# Patient Record
Sex: Female | Born: 1945 | Race: White | Hispanic: No | Marital: Married | Smoking: Never smoker
Health system: Southern US, Community
[De-identification: ages and names within clinical notes are randomized; demographics above are authoritative.]

## PROBLEM LIST (undated history)

## (undated) DIAGNOSIS — I509 Heart failure, unspecified: Secondary | ICD-10-CM

## (undated) DIAGNOSIS — M1990 Unspecified osteoarthritis, unspecified site: Secondary | ICD-10-CM

## (undated) DIAGNOSIS — H269 Unspecified cataract: Secondary | ICD-10-CM

## (undated) DIAGNOSIS — I499 Cardiac arrhythmia, unspecified: Secondary | ICD-10-CM

## (undated) DIAGNOSIS — K219 Gastro-esophageal reflux disease without esophagitis: Secondary | ICD-10-CM

## (undated) DIAGNOSIS — M47816 Spondylosis without myelopathy or radiculopathy, lumbar region: Secondary | ICD-10-CM

## (undated) DIAGNOSIS — I4891 Unspecified atrial fibrillation: Secondary | ICD-10-CM

## (undated) HISTORY — DX: Gastro-esophageal reflux disease without esophagitis: K21.9

## (undated) HISTORY — DX: Hemochromatosis, unspecified: E83.119

## (undated) HISTORY — DX: Unspecified cataract: H26.9

## (undated) HISTORY — DX: Heart failure, unspecified: I50.9

## (undated) HISTORY — DX: Unspecified osteoarthritis, unspecified site: M19.90

## (undated) HISTORY — DX: Other disorders of iron metabolism: E83.19

## (undated) HISTORY — PX: TONSILLECTOMY: SUR1361

## (undated) HISTORY — DX: Unspecified atrial fibrillation: I48.91

---

## 2017-02-27 ENCOUNTER — Other Ambulatory Visit: Payer: Self-pay

## 2017-02-27 ENCOUNTER — Ambulatory Visit (INDEPENDENT_AMBULATORY_CARE_PROVIDER_SITE_OTHER): Payer: Medicare Other | Admitting: Family Medicine

## 2017-02-27 ENCOUNTER — Encounter: Payer: Self-pay | Admitting: Family Medicine

## 2017-02-27 VITALS — BP 120/66 | HR 92 | Temp 98.4°F | Resp 16 | Ht 67.0 in | Wt 241.0 lb

## 2017-02-27 DIAGNOSIS — I4891 Unspecified atrial fibrillation: Secondary | ICD-10-CM | POA: Diagnosis not present

## 2017-02-27 DIAGNOSIS — L989 Disorder of the skin and subcutaneous tissue, unspecified: Secondary | ICD-10-CM | POA: Diagnosis not present

## 2017-02-27 DIAGNOSIS — Z23 Encounter for immunization: Secondary | ICD-10-CM | POA: Diagnosis not present

## 2017-02-27 DIAGNOSIS — R5383 Other fatigue: Secondary | ICD-10-CM | POA: Diagnosis not present

## 2017-02-27 DIAGNOSIS — E785 Hyperlipidemia, unspecified: Secondary | ICD-10-CM | POA: Insufficient documentation

## 2017-02-27 DIAGNOSIS — Z79899 Other long term (current) drug therapy: Secondary | ICD-10-CM | POA: Diagnosis not present

## 2017-02-27 DIAGNOSIS — E782 Mixed hyperlipidemia: Secondary | ICD-10-CM | POA: Diagnosis not present

## 2017-02-27 DIAGNOSIS — E559 Vitamin D deficiency, unspecified: Secondary | ICD-10-CM

## 2017-02-27 DIAGNOSIS — I482 Chronic atrial fibrillation, unspecified: Secondary | ICD-10-CM | POA: Insufficient documentation

## 2017-02-27 NOTE — Progress Notes (Signed)
Patient ID: Samona Chihuahua, female    DOB: [birth year], 71 y.o.   MRN: [id number]  Chief Complaint  Patient presents with  . Atrial Fibrillation  . Rash    Allergies Tetanus toxoids  Subjective:   Selena Swaminathan is a 71 y.o. female who presents to [facility] today.  HPI Here to establish care.  Reports that she recently moved with her husband from [place], [place].  Reports that she moved to this area to be closer to her children which live in [place].  She reports that she has not really been seen by a physician in approximately a year.  Was previously followed by cardiology for history of atrial fibrillation which was rate controlled on medication.  She reports that she has continued to take her medications each day.  She denies any chest pain, shortness of breath, palpitations, or dyspnea on exertion.  She does not do much exercise but reports that she stays active around the house.  Has been on flecainide and metoprolol with no problems.  History of hemochromatosis.  Reports that she was initially seen by gastroenterologist and would go in for routine lab draws to decrease her ferritin and hemoglobin levels.  Reports that later on she would just go give blood to the [facility].  However she reports that [place] does not allow blood donation by patients with hemochromatosis because it is considered a disease in [place].  She has not had any lab checks or blood donations in over 6 months.  She reports that she has had an area on the surface of her left hand which is increased in size over the past several months.  She reports the area feels hard and is tender to palpation.  She is concerned that it could be skin cancer and would like this checked out.  She reports that she has never been to a dermatologist in the past.  No prior history of skin cancer.  She reports she is concerned because her husband does have a history of melanoma.    Past Medical History:  Diagnosis  Date  . Atrial fibrillation ([place])   . Iron excess     Past Surgical History:  Procedure Laterality Date  . CESAREAN SECTION    . TONSILLECTOMY      Family History  Problem Relation Age of Onset  . Stroke Mother   . Stroke Maternal Grandmother   . Diabetes Paternal Grandfather   . Arthritis Brother      Social History   Socioeconomic History  . Marital status: Married    Spouse name: None  . Number of children: None  . Years of education: None  . Highest education level: None  Social Needs  . Financial resource strain: None  . Food insecurity - worry: None  . Food insecurity - inability: None  . Transportation needs - medical: None  . Transportation needs - non-medical: None  Occupational History  . None  Tobacco Use  . Smoking status: Never Smoker  . Smokeless tobacco: Never Used  Substance and Sexual Activity  . Alcohol use: Yes    Alcohol/week: 1.2 oz    Types: 2 Glasses of wine per week  . Drug use: No  . Sexual activity: Yes  Other Topics Concern  . None  Social History Narrative   Retired Copywriter, advertising.   Married.   Has children 3.    Grew up in [place], [place].   Married for over 31 years.  Eats all food groups.   Just moved to [place], [place] in 8/18.     Review of Systems  Constitutional: Negative for activity change, appetite change and fever.  Eyes: Negative for visual disturbance.  Respiratory: Negative for cough, chest tightness and shortness of breath.   Cardiovascular: Negative for chest pain, palpitations and leg swelling.  Gastrointestinal: Negative for abdominal pain, nausea and vomiting.  Genitourinary: Negative for dysuria, frequency and urgency.  Neurological: Negative for dizziness, syncope and light-headedness.  Hematological: Negative for adenopathy.     Objective:   BP 120/66 (BP Location: Left Arm, Patient Position: Sitting, Cuff Size: Normal)   Pulse 92   Temp 98.4 F (36.9 C) (Other (Comment))   Resp 16   Ht 5'  7" (1.702 m)   Wt 241 lb (109.3 kg)   SpO2 97%   BMI 37.75 kg/m   Physical Exam  Constitutional: She is oriented to person, place, and time. She appears well-developed and well-nourished. No distress.  HENT:  Head: Normocephalic and atraumatic.  Eyes: Pupils are equal, round, and reactive to light.  Neck: Normal range of motion. Neck supple. No thyromegaly present.  Cardiovascular: Normal rate, regular rhythm and normal heart sounds.  Pulmonary/Chest: Effort normal and breath sounds normal. No respiratory distress.  Abdominal: Soft. Bowel sounds are normal.  Obese  Musculoskeletal: Normal range of motion.  Neurological: She is alert and oriented to person, place, and time. No cranial nerve deficit. Coordination normal.  Skin: Skin is warm and dry. Capillary refill takes less than 2 seconds.  1 x 0.5 cm lesion on right hand, keratinized surface, with surrounding erythematous base.  Tenderness to palpation.  Psychiatric: She has a normal mood and affect. Her behavior is normal. Thought content normal.  Nursing note and vitals reviewed.    Assessment and Plan   1. Skin lesion Discussed with patient that at this time due to the location and the tenderness of skin lesion that I would recommend she be seen by dermatology for evaluation and management.  Referral placed. - Ambulatory referral to Dermatology  2. Mixed hyperlipidemia Check cholesterol panel and screen for diabetes today.  Diet, exercise, and weight loss discussed with patient. - Hemoglobin A1c - Lipid panel  3. High risk medication use Check secondary to cardiac medications. - Basic metabolic panel  4. Vitamin D deficiency History of vitamin D deficiency.  Check levels.  Discussed by  bone mineral density at follow-up.   - Vitamin D (25 hydroxy)  5. Fatigue, unspecified type Persistent and chronic, but not interfering with daily life. - TSH  6. Hereditary hemochromatosis ([place]) Check labs and determine  management after discussing with local physicians to find out if generally managed by GI versus hematology.  Will also need to check in to where patient can get her blood draws performed. - CBC with Differential/Platelet - Fe+TIBC+Fer  7. Atrial fibrillation, unspecified type [facility]) Her medications will be refilled but she is in agreement to follow-up with cardiology.  Stable at this time.  However I did discuss with patient that she should have yearly follow-up by cardiology. - Hemoglobin A1c - Ambulatory referral to Cardiology  8. Need for immunization against influenza We will  request immunization records from previous physicians. - Flu Vaccine QUAD 36+ mos IM Return in about 3 months (around 05/28/2017). Caren Macadam, MD 02/27/2017

## 2017-02-28 LAB — LIPID PANEL
CHOLESTEROL: 201 mg/dL — AB (ref <200)
HDL: 50 mg/dL — AB (ref >50)
LDL CHOLESTEROL (CALC): 120 mg/dL — AB
Non-HDL Cholesterol (Calc): 151 mg/dL (calc) — ABNORMAL HIGH (ref <130)
TRIGLYCERIDES: 185 mg/dL — AB (ref <150)
Total CHOL/HDL Ratio: 4 (calc) (ref <5.0)

## 2017-02-28 LAB — BASIC METABOLIC PANEL
BUN: 24 mg/dL (ref 7–25)
CALCIUM: 8.8 mg/dL (ref 8.6–10.4)
CHLORIDE: 106 mmol/L (ref 98–110)
CO2: 29 mmol/L (ref 20–32)
CREATININE: 0.83 mg/dL (ref 0.60–0.93)
Glucose, Bld: 129 mg/dL — ABNORMAL HIGH (ref 65–99)
Potassium: 3.8 mmol/L (ref 3.5–5.3)
Sodium: 142 mmol/L (ref 135–146)

## 2017-02-28 LAB — CBC WITH DIFFERENTIAL/PLATELET
BASOS PCT: 1 %
Basophils Absolute: 52 cells/uL (ref 0–200)
EOS PCT: 4.9 %
Eosinophils Absolute: 255 cells/uL (ref 15–500)
HCT: 40.2 % (ref 35.0–45.0)
Hemoglobin: 13.8 g/dL (ref 11.7–15.5)
Lymphs Abs: 1300 cells/uL (ref 850–3900)
MCH: 31.6 pg (ref 27.0–33.0)
MCHC: 34.3 g/dL (ref 32.0–36.0)
MCV: 92 fL (ref 80.0–100.0)
MPV: 9.9 fL (ref 7.5–12.5)
Monocytes Relative: 6.8 %
Neutro Abs: 3240 cells/uL (ref 1500–7800)
Neutrophils Relative %: 62.3 %
PLATELETS: 185 10*3/uL (ref 140–400)
RBC: 4.37 10*6/uL (ref 3.80–5.10)
RDW: 11.8 % (ref 11.0–15.0)
TOTAL LYMPHOCYTE: 25 %
WBC: 5.2 10*3/uL (ref 3.8–10.8)
WBCMIX: 354 {cells}/uL (ref 200–950)

## 2017-02-28 LAB — HEMOGLOBIN A1C
EAG (MMOL/L): 5.8 (calc)
Hgb A1c MFr Bld: 5.3 % of total Hgb (ref <5.7)
MEAN PLASMA GLUCOSE: 105 (calc)

## 2017-02-28 LAB — IRON,TIBC AND FERRITIN PANEL
%SAT: 58 % — AB (ref 11–50)
FERRITIN: 184 ng/mL (ref 20–288)
Iron: 150 ug/dL (ref 45–160)
TIBC: 258 mcg/dL (calc) (ref 250–450)

## 2017-02-28 LAB — VITAMIN D 25 HYDROXY (VIT D DEFICIENCY, FRACTURES): Vit D, 25-Hydroxy: 43 ng/mL (ref 30–100)

## 2017-02-28 LAB — TSH: TSH: 1.45 m[IU]/L (ref 0.40–4.50)

## 2017-03-03 ENCOUNTER — Encounter: Payer: Self-pay | Admitting: Family Medicine

## 2017-03-20 ENCOUNTER — Encounter: Payer: Self-pay | Admitting: Family Medicine

## 2017-04-02 ENCOUNTER — Ambulatory Visit: Payer: Medicare Other | Admitting: Cardiovascular Disease

## 2017-04-02 ENCOUNTER — Encounter: Payer: Self-pay | Admitting: Cardiovascular Disease

## 2017-04-02 VITALS — BP 134/74 | HR 82 | Ht 68.0 in | Wt 242.0 lb

## 2017-04-02 DIAGNOSIS — I480 Paroxysmal atrial fibrillation: Secondary | ICD-10-CM

## 2017-04-02 DIAGNOSIS — Z7189 Other specified counseling: Secondary | ICD-10-CM

## 2017-04-02 MED ORDER — APIXABAN 5 MG PO TABS: 5.0000 mg | ORAL_TABLET | Freq: Two times a day (BID) | ORAL | 11 refills | Status: DC

## 2017-04-02 NOTE — Progress Notes (Signed)
CARDIOLOGY CONSULT NOTE  Patient ID: Amanda Mckay MRN: [id number] DOB/AGE: Jan 05, 1946 72 y.o.  Admit date: (Not on file) Primary Physician: Caren Macadam, MD Referring Physician: Caren Macadam, MD  Reason for Consultation: Atrial fibrillation  HPI: Amanda Mckay is a 72 y.o. female who is being seen today for the evaluation of atrial fibrillation at the request of Caren Macadam, MD.   I reviewed notes from her PCP.  She recently moved with her husband from [place], [place] in August 2018.  She did so in order to be closer to her children who live in [place].  She has a history of atrial fibrillation and hemochromatosis.  She is a retired Copywriter, advertising.  I reviewed labs performed on 02/27/17.  Basic metabolic panel, hemoglobin A1c, and CBC are normal.  Lipid panel demonstrated total cholesterol 201, HDL 50, triglycerides 185, LDL 120.  TSH and vitamin D levels were also normal.  ECG performed in the office today which I ordered and personally interpreted demonstrates normal sinus rhythm with no ischemic ST segment or T-wave abnormalities, nor any arrhythmias.  She tells me she was first diagnosed with atrial fibrillation approximately 5 years ago.  She had no associated infections at that time which may have triggered it.  She was placed on Xarelto for short period of time and then it was stopped.  She has been maintained on aspirin.  She was also started on metoprolol flecainide and has been maintained on this since that time.  She denies any exertional chest pain, shortness of breath, orthopnea, leg swelling, and paroxysmal nocturnal dyspnea.  She very seldom has palpitations which may last 3 or 4 minutes at a time.  Family history: Both her father and her son have atrial fibrillation.  Her son is now 67 but was first diagnosed at age 25.  Her mother, maternal aunt, and maternal grandmother have all had strokes.    Allergies  Allergen Reactions  . Tetanus Toxoids      Current Outpatient Medications  Medication Sig Dispense Refill  . calcium carbonate (CALCIUM 600) 600 MG TABS tablet Take 600 mg by mouth 2 (two) times daily with a meal.    . Cholecalciferol (D3-1000) 1000 units capsule Take 1,000 Units by mouth daily.    . famotidine (PEPCID) 40 MG tablet Take 40 mg by mouth as needed for heartburn or indigestion.    . flecainide (TAMBOCOR) 100 MG tablet Take 150 mg by mouth 2 (two) times daily.    . metoprolol tartrate (LOPRESSOR) 25 MG tablet Take 25 mg by mouth 2 (two) times daily.    [name] apixaban (ELIQUIS) 5 MG TABS tablet Take 1 tablet (5 mg total) by mouth 2 (two) times daily. 60 tablet 11   No current facility-administered medications for this visit.     Past Medical History:  Diagnosis Date  . Atrial fibrillation ([place])   . Iron excess     Past Surgical History:  Procedure Laterality Date  . CESAREAN SECTION    . TONSILLECTOMY      Social History   Socioeconomic History  . Marital status: Married    Spouse name: Not on file  . Number of children: Not on file  . Years of education: Not on file  . Highest education level: Not on file  Social Needs  . Financial resource strain: Not on file  . Food insecurity - worry: Not on file  . Food insecurity - inability: Not on file  .  Transportation needs - medical: Not on file  . Transportation needs - non-medical: Not on file  Occupational History  . Not on file  Tobacco Use  . Smoking status: Never Smoker  . Smokeless tobacco: Never Used  Substance and Sexual Activity  . Alcohol use: Yes    Alcohol/week: 1.2 oz    Types: 2 Glasses of wine per week  . Drug use: No  . Sexual activity: Yes  Other Topics Concern  . Not on file  Social History Narrative   Retired Copywriter, advertising.   Married.   Has children 3.    Grew up in [place], [place].   Married for over 31 years.    Eats all food groups.   Just moved to [place], [place] in 8/18.      No family history of premature CAD in  1st degree relatives.  Current Meds  Medication Sig  . calcium carbonate (CALCIUM 600) 600 MG TABS tablet Take 600 mg by mouth 2 (two) times daily with a meal.  . Cholecalciferol (D3-1000) 1000 units capsule Take 1,000 Units by mouth daily.  . famotidine (PEPCID) 40 MG tablet Take 40 mg by mouth as needed for heartburn or indigestion.  . flecainide (TAMBOCOR) 100 MG tablet Take 150 mg by mouth 2 (two) times daily.  . metoprolol tartrate (LOPRESSOR) 25 MG tablet Take 25 mg by mouth 2 (two) times daily.  . [DISCONTINUED] aspirin 325 MG tablet Take 325 mg by mouth daily.      Review of systems complete and found to be negative unless listed above in HPI    Physical exam Blood pressure 134/74, pulse 82, height 5\' 8"  (1.727 m), weight 242 lb (109.8 kg), SpO2 96 %. General: NAD Neck: No JVD, no thyromegaly or thyroid nodule.  Lungs: Clear to auscultation bilaterally with normal respiratory effort. CV: Nondisplaced PMI. Regular rate and rhythm, normal S1/S2, no S3/S4, no murmur.  Trivial peri-ankle edema.  No carotid bruit.    Abdomen: Soft, nontender, no distention.  Skin: Intact without lesions or rashes.  Neurologic: Alert and oriented x 3.  Psych: Normal affect. Extremities: No clubbing or cyanosis.  HEENT: Normal.   ECG: Most recent ECG reviewed.   Labs: Lab Results  Component Value Date/Time   K 3.8 02/27/2017 10:35 AM   BUN 24 02/27/2017 10:35 AM   CREATININE 0.83 02/27/2017 10:35 AM   TSH 1.45 02/27/2017 10:35 AM   HGB 13.8 02/27/2017 10:35 AM     Lipids: Lab Results  Component Value Date/Time   CHOL 201 (H) 02/27/2017 10:35 AM   TRIG 185 (H) 02/27/2017 10:35 AM   HDL 50 (L) 02/27/2017 10:35 AM        ASSESSMENT AND PLAN:  1.  Paroxysmal atrial fibrillation: She is symptomatically stable and has been maintained on flecainide and metoprolol tartrate for several years.  She has also been maintained on aspirin.  This patients CHA2DS2-VASc Score and unadjusted  Ischemic Stroke Rate (% per year) is equal to 2.  Thus, systemic anticoagulation is indicated to reduce thromboembolic risk. I will discontinue aspirin and start Eliquis 5 mg twice daily.  For the time being I will keep her on metoprolol and flecainide but may consider the discontinuation of flecainide in the future.      Disposition: Follow up in 6 months  Signed: Kate Sable, M.D., F.A.C.C.  04/02/2017, 8:56 AM

## 2017-04-02 NOTE — Patient Instructions (Signed)
Medication Instructions:  Your physician has recommended you make the following change in your medication: Stop Taking Aspirin  Start Taking Eliquis 5 mg Two Times Daily    Labwork: NONE   Testing/Procedures: NONE   Follow-Up: Your physician wants you to follow-up in: 6 Months with Dr. Bronson Ing.  You will receive a reminder letter in the mail two months in advance. If you don't receive a letter, please call our office to schedule the follow-up appointment.   Any Other Special Instructions Will Be Listed Below (If Applicable).     If you need a refill on your cardiac medications before your next appointment, please call your pharmacy. Thank you for choosing [facility]!

## 2017-05-28 ENCOUNTER — Other Ambulatory Visit: Payer: Self-pay | Admitting: Family Medicine

## 2017-05-28 ENCOUNTER — Ambulatory Visit: Payer: Medicare Other | Admitting: Family Medicine

## 2017-05-28 ENCOUNTER — Encounter: Payer: Self-pay | Admitting: Family Medicine

## 2017-05-28 VITALS — BP 132/78 | HR 97 | Temp 98.1°F | Resp 16 | Ht 68.0 in | Wt 238.2 lb

## 2017-05-28 DIAGNOSIS — Z1231 Encounter for screening mammogram for malignant neoplasm of breast: Secondary | ICD-10-CM

## 2017-05-28 DIAGNOSIS — I480 Paroxysmal atrial fibrillation: Secondary | ICD-10-CM | POA: Diagnosis not present

## 2017-05-28 DIAGNOSIS — M1712 Unilateral primary osteoarthritis, left knee: Secondary | ICD-10-CM | POA: Diagnosis not present

## 2017-05-28 DIAGNOSIS — Z1211 Encounter for screening for malignant neoplasm of colon: Secondary | ICD-10-CM | POA: Diagnosis not present

## 2017-05-28 DIAGNOSIS — Z1239 Encounter for other screening for malignant neoplasm of breast: Secondary | ICD-10-CM

## 2017-05-28 DIAGNOSIS — Z1212 Encounter for screening for malignant neoplasm of rectum: Secondary | ICD-10-CM

## 2017-05-28 DIAGNOSIS — Z1159 Encounter for screening for other viral diseases: Secondary | ICD-10-CM | POA: Diagnosis not present

## 2017-05-28 NOTE — Patient Instructions (Signed)
Fat and Cholesterol Restricted Diet Getting too much fat and cholesterol in your diet may cause health problems. Following this diet helps keep your fat and cholesterol at normal levels. This can keep you from getting sick. What types of fat should I choose?  Choose monosaturated and polyunsaturated fats. These are found in foods such as olive oil, canola oil, flaxseeds, walnuts, almonds, and seeds.  Eat more omega-3 fats. Good choices include salmon, mackerel, sardines, tuna, flaxseed oil, and ground flaxseeds.  Limit saturated fats. These are in animal products such as meats, butter, and cream. They can also be in plant products such as palm oil, palm kernel oil, and coconut oil.  Avoid foods with partially hydrogenated oils in them. These contain trans fats. Examples of foods that have trans fats are stick margarine, some tub margarines, cookies, crackers, and other baked goods. What general guidelines do I need to follow?  Check food labels. Look for the words "trans fat" and "saturated fat."  When preparing a meal: ? Fill half of your plate with vegetables and green salads. ? Fill one fourth of your plate with whole grains. Look for the word "whole" as the first word in the ingredient list. ? Fill one fourth of your plate with lean protein foods.  Eat more foods that have fiber, like apples, carrots, beans, peas, and barley.  Eat more home-cooked foods. Eat less at restaurants and buffets.  Limit or avoid alcohol.  Limit foods high in starch and sugar.  Limit fried foods.  Cook foods without frying them. Baking, boiling, grilling, and broiling are all great options.  Lose weight if you are overweight. Losing even a small amount of weight can help your overall health. It can also help prevent diseases such as diabetes and heart disease. What foods can I eat? Grains Whole grains, such as whole wheat or whole grain breads, crackers, cereals, and pasta. Unsweetened oatmeal,  bulgur, barley, quinoa, or brown rice. Corn or whole wheat flour tortillas. Vegetables Fresh or frozen vegetables (raw, steamed, roasted, or grilled). Green salads. Fruits All fresh, canned (in natural juice), or frozen fruits. Meat and Other Protein Products Ground beef (85% or leaner), grass-fed beef, or beef trimmed of fat. Skinless chicken or turkey. Ground chicken or turkey. Pork trimmed of fat. All fish and seafood. Eggs. Dried beans, peas, or lentils. Unsalted nuts or seeds. Unsalted canned or dry beans. Dairy Low-fat dairy products, such as skim or 1% milk, 2% or reduced-fat cheeses, low-fat ricotta or cottage cheese, or plain low-fat yogurt. Fats and Oils Tub margarines without trans fats. Light or reduced-fat mayonnaise and salad dressings. Avocado. Olive, canola, sesame, or safflower oils. Natural peanut or almond butter (choose ones without added sugar and oil). The items listed above may not be a complete list of recommended foods or beverages. Contact your dietitian for more options. What foods are not recommended? Grains White bread. White pasta. White rice. Cornbread. Bagels, pastries, and croissants. Crackers that contain trans fat. Vegetables White potatoes. Corn. Creamed or fried vegetables. Vegetables in a cheese sauce. Fruits Dried fruits. Canned fruit in light or heavy syrup. Fruit juice. Meat and Other Protein Products Fatty cuts of meat. Ribs, chicken wings, bacon, sausage, bologna, salami, chitterlings, fatback, hot dogs, bratwurst, and packaged luncheon meats. Liver and organ meats. Dairy Whole or 2% milk, cream, half-and-half, and cream cheese. Whole milk cheeses. Whole-fat or sweetened yogurt. Full-fat cheeses. Nondairy creamers and whipped toppings. Processed cheese, cheese spreads, or cheese curds. Sweets and Desserts Corn   syrup, sugars, honey, and molasses. Candy. Jam and jelly. Syrup. Sweetened cereals. Cookies, pies, cakes, donuts, muffins, and ice  cream. Fats and Oils Butter, stick margarine, lard, shortening, ghee, or bacon fat. Coconut, palm kernel, or palm oils. Beverages Alcohol. Sweetened drinks (such as sodas, lemonade, and fruit drinks or punches). The items listed above may not be a complete list of foods and beverages to avoid. Contact your dietitian for more information. This information is not intended to replace advice given to you by your health care provider. Make sure you discuss any questions you have with your health care provider. Document Released: 09/03/2011 Document Revised: 11/09/2015 Document Reviewed: 06/03/2013 Elsevier Interactive Patient Education  2018 [place]. High Cholesterol High cholesterol is a condition in which the blood has high levels of a white, waxy, fat-like substance (cholesterol). The human body needs small amounts of cholesterol. The liver makes all the cholesterol that the body needs. Extra (excess) cholesterol comes from the food that we eat. Cholesterol is carried from the liver by the blood through the blood vessels. If you have high cholesterol, deposits (plaques) may build up on the walls of your blood vessels (arteries). Plaques make the arteries narrower and stiffer. Cholesterol plaques increase your risk for heart attack and stroke. Work with your health care provider to keep your cholesterol levels in a healthy range. What increases the risk? This condition is more likely to develop in people who:  Eat foods that are high in animal fat (saturated fat) or cholesterol.  Are overweight.  Are not getting enough exercise.  Have a family history of high cholesterol.  What are the signs or symptoms? There are no symptoms of this condition. How is this diagnosed? This condition may be diagnosed from the results of a blood test.  If you are older than age 61, your health care provider may check your cholesterol every 4-6 years.  You may be checked more often if you already have  high cholesterol or other risk factors for heart disease.  The blood test for cholesterol measures:  "Bad" cholesterol (LDL cholesterol). This is the main type of cholesterol that causes heart disease. The desired level for LDL is less than 100.  "Good" cholesterol (HDL cholesterol). This type helps to protect against heart disease by cleaning the arteries and carrying the LDL away. The desired level for HDL is 60 or higher.  Triglycerides. These are fats that the body can store or burn for energy. The desired number for triglycerides is lower than 150.  Total cholesterol. This is a measure of the total amount of cholesterol in your blood, including LDL cholesterol, HDL cholesterol, and triglycerides. A healthy number is less than 200.  How is this treated? This condition is treated with diet changes, lifestyle changes, and medicines. Diet changes  This may include eating more whole grains, fruits, vegetables, nuts, and fish.  This may also include cutting back on red meat and foods that have a lot of added sugar. Lifestyle changes  Changes may include getting at least 40 minutes of aerobic exercise 3 times a week. Aerobic exercises include walking, biking, and swimming. Aerobic exercise along with a healthy diet can help you maintain a healthy weight.  Changes may also include quitting smoking. Medicines  Medicines are usually given if diet and lifestyle changes have failed to reduce your cholesterol to healthy levels.  Your health care provider may prescribe a statin medicine. Statin medicines have been shown to reduce cholesterol, which can reduce the  risk of heart disease. Follow these instructions at home: Eating and drinking  If told by your health care provider:  Eat chicken (without skin), fish, veal, shellfish, ground [place] breast, and round or loin cuts of red meat.  Do not eat fried foods or fatty meats, such as hot dogs and salami.  Eat plenty of fruits, such as  apples.  Eat plenty of vegetables, such as broccoli, potatoes, and carrots.  Eat beans, peas, and lentils.  Eat grains such as barley, rice, couscous, and bulgur wheat.  Eat pasta without cream sauces.  Use skim or nonfat milk, and eat low-fat or nonfat yogurt and cheeses.  Do not eat or drink whole milk, cream, ice cream, egg yolks, or hard cheeses.  Do not eat stick margarine or tub margarines that contain trans fats (also called partially hydrogenated oils).  Do not eat saturated tropical oils, such as coconut oil and palm oil.  Do not eat cakes, cookies, crackers, or other baked goods that contain trans fats.  General instructions  Exercise as directed by your health care provider. Increase your activity level with activities such as gardening, walking, and taking the stairs.  Take over-the-counter and prescription medicines only as told by your health care provider.  Do not use any products that contain nicotine or tobacco, such as cigarettes and e-cigarettes. If you need help quitting, ask your health care provider.  Keep all follow-up visits as told by your health care provider. This is important. Contact a health care provider if:  You are struggling to maintain a healthy diet or weight.  You need help to start on an exercise program.  You need help to stop smoking. Get help right away if:  You have chest pain.  You have trouble breathing. This information is not intended to replace advice given to you by your health care provider. Make sure you discuss any questions you have with your health care provider. Document Released: 03/04/2005 Document Revised: 09/30/2015 Document Reviewed: 09/02/2015 Elsevier Interactive Patient Education  [employer].

## 2017-05-28 NOTE — Progress Notes (Signed)
Patient ID: Amanda Mckay, female    DOB: Aug 02, 1945, 72 y.o.   MRN: [id number]  Chief Complaint  Patient presents with  . Follow-up    Allergies Tetanus toxoids  Subjective:   [name] is a 72 y.o. female who presents to [facility] today.  HPI 72 year old female presents for follow up visit today to discuss her anticoagulation and arthritis.  She was seen by Dr. Bronson Ing in January 2019 for history of atrial fibrillation.  She reports that at that time it was recommended by him that she be on Eliquis rather than aspirin for her paroxysmal A. fib.  She reports that she did get the prescription medication and took it for several days but read all the warnings on the medication and decided not to take it.  She has gone back to taking her aspirin on a daily basis and would like to discuss this today.  She reports that she does not feel like she goes in and out of atrial fibrillation.  She denies palpitations.  She reports she has never had a stroke.  She denies any chest pain.  She reports that she does not feel her heart beating fast.  She reports that she did not like the fact that this medicine caused her over $30 a month.  She does tell me today that she had to pay a co-pay to be seen and wonders when she will get her free visit that her insurance tells her that she is able to get for a physical.  She also reports that she has had a history of left knee osteoarthritis in the past.  She has x-rays to confirm this diagnosis.  She reports that she has had intermittent pain in her left knee for years.  She denies any knee instability.  She reports that several weeks ago she had left sided knee pain that was bad for a few days, got a bit better. Used ibuprofen 200mg  2 po bd.  Reports that in the past she has done physical therapy to help with her knee osteoarthritis and it did benefit her tremendously.  She does not exercise on a regular basis.  She knows that she needs to lose  weight.  She denies any redness or swelling in the knee joint.  She denies any injury to her knee.  She denies any redness associated with the knee.  She reports that she is not interested in having a mammogram done.  She also is not interested in having Colo guard testing done at this time or colonoscopy for colon cancer screening.  She is not interested in having her hepatitis C screening performed.  She reports that she feels good.  Her energy level is good.  Her mood is good.  Appetite is good.  She is taking all of her medicines as directed other than the Eliquis.    Past Medical History:  Diagnosis Date  . Atrial fibrillation ([place])   . Iron excess     Past Surgical History:  Procedure Laterality Date  . CESAREAN SECTION    . TONSILLECTOMY      Family History  Problem Relation Age of Onset  . Stroke Mother   . Stroke Maternal Grandmother   . Diabetes Paternal Grandfather   . Arthritis Brother      Social History   Socioeconomic History  . Marital status: Married    Spouse name: None  . Number of children: None  . Years of education:  None  . Highest education level: None  Social Needs  . Financial resource strain: None  . Food insecurity - worry: None  . Food insecurity - inability: None  . Transportation needs - medical: None  . Transportation needs - non-medical: None  Occupational History  . None  Tobacco Use  . Smoking status: Never Smoker  . Smokeless tobacco: Never Used  Substance and Sexual Activity  . Alcohol use: Yes    Alcohol/week: 1.2 oz    Types: 2 Glasses of wine per week  . Drug use: No  . Sexual activity: Yes  Other Topics Concern  . None  Social History Narrative   Retired Copywriter, advertising.   Married.   Has children 3.    Grew up in [place], [place].   Married for over 31 years.    Eats all food groups.   Just moved to [place], [place] in 8/18.    Current Outpatient Medications on File Prior to Visit  Medication Sig Dispense Refill  .  apixaban (ELIQUIS) 5 MG TABS tablet Take 1 tablet (5 mg total) by mouth 2 (two) times daily. 60 tablet 11  . calcium carbonate (CALCIUM 600) 600 MG TABS tablet Take 600 mg by mouth 2 (two) times daily with a meal.    . Cholecalciferol (D3-1000) 1000 units capsule Take 1,000 Units by mouth daily.    . famotidine (PEPCID) 40 MG tablet Take 40 mg by mouth as needed for heartburn or indigestion.    . flecainide (TAMBOCOR) 100 MG tablet Take 150 mg by mouth 2 (two) times daily.    . metoprolol tartrate (LOPRESSOR) 25 MG tablet Take 25 mg by mouth 2 (two) times daily.     No current facility-administered medications on file prior to visit.     Review of Systems  Constitutional: Negative for activity change, appetite change and fever.  Eyes: Negative for visual disturbance.  Respiratory: Negative for cough, chest tightness and shortness of breath.   Cardiovascular: Negative for chest pain, palpitations and leg swelling.  Gastrointestinal: Negative for abdominal pain, nausea and vomiting.  Genitourinary: Negative for dysuria, frequency and urgency.  Musculoskeletal:       Knee pain  Neurological: Negative for dizziness, syncope and light-headedness.  Hematological: Negative for adenopathy.     Objective:   BP 132/78 (BP Location: Left Arm, Patient Position: Sitting, Cuff Size: Normal)   Pulse 97   Temp 98.1 F (36.7 C) (Temporal)   Resp 16   Ht 5\' 8"  (1.727 m)   Wt 238 lb 4 oz (108.1 kg)   SpO2 98%   BMI 36.23 kg/m   Physical Exam  Constitutional: She is oriented to person, place, and time. She appears well-developed and well-nourished. No distress.  HENT:  Head: Normocephalic and atraumatic.  Eyes: Pupils are equal, round, and reactive to light.  Neck: Normal range of motion. Neck supple. No thyromegaly present.  Cardiovascular: Normal rate, regular rhythm and normal heart sounds.  Pulmonary/Chest: Effort normal and breath sounds normal. No respiratory distress.    Musculoskeletal:       Left knee: She exhibits decreased range of motion. She exhibits no swelling, no effusion, no ecchymosis, no deformity and no erythema. No tenderness found.  Neurological: She is alert and oriented to person, place, and time. No cranial nerve deficit.  Skin: Skin is warm and dry.  Nursing note and vitals reviewed.    Assessment and Plan  1. Primary osteoarthritis of left knee Patient defers any x-rays of the  knee at this time.  I also do not believe that it is warranted.  We did discuss that weight loss would decrease stress and strain on her knees and joints.  She has recommended diet and exercise modifications.  Will place referral for physical therapy to help her with strengthening of her knee and help develop a home exercise plan.  We did discuss the risk of her taking NSAIDs and it was recommended that she use Tylenol over-the-counter as needed for arthritis pain.  She was counseled concerning worrisome signs and symptoms of knee pain and if those develop to call or return to clinic. Discussed risks of cardiovascular thrombotic events related to NSAIDS. Discussed increased risk of AMI and CVA. Discussed risk of serious GI adverse events including bleeding, ulcers, and perforation. Patient understands risks of this medication.   - Ambulatory referral to Physical Therapy  2. Paroxysmal atrial fibrillation [facility]) Patient reports that she has not been taking the Eliquis.  We did review and discuss the cardiovascular risks associated with atrial fibrillation such as thromboembolic stroke.  We reviewed her chads vas score and I also reviewed with her the office visit note with Dr. Bronson Ing.  She is not interested in resuming the Eliquis at this time and wishes to continue the aspirin.  She is competent and understands the risks versus benefits of her medical decision making.  We discussed this in detail.  3. Screening for breast cancer Patient defers screening for breast  cancer at this time.  She understands the risks of refusal of this test.  4. Encounter for hepatitis C screening test for low risk patient Patient refuses hepatitis C screening test at this time.  She understands risk of refusal.  5. Special screening for malignant neoplasms, colon Patient will consider Colo guard testing.  She is given information on this test today and it was discussed verbally. - Cologuard  6. Screening for malignant neoplasm of the rectum Colo guard testing recommended.  Patient adamantly defers colonoscopy - Cologuard  I did discuss with patient that she can schedule a complete physical exam whenever she would like.  Office visit today was greater than 30 minutes.  Greater than 50% of visit was spent counseling patient on the above.  Immunization records requested today. Return for CPE. Caren Macadam, MD 05/31/2017

## 2017-06-05 ENCOUNTER — Ambulatory Visit (HOSPITAL_COMMUNITY)
Admission: RE | Admit: 2017-06-05 | Discharge: 2017-06-05 | Disposition: A | Discharge status: Home / Self Care | Payer: Medicare Other | Source: Ambulatory Visit | Attending: Family Medicine | Admitting: Family Medicine

## 2017-06-05 ENCOUNTER — Telehealth (HOSPITAL_COMMUNITY): Payer: Self-pay | Admitting: Family Medicine

## 2017-06-05 ENCOUNTER — Encounter (HOSPITAL_COMMUNITY): Payer: Self-pay

## 2017-06-05 DIAGNOSIS — Z1231 Encounter for screening mammogram for malignant neoplasm of breast: Secondary | ICD-10-CM | POA: Diagnosis present

## 2017-06-05 NOTE — Telephone Encounter (Signed)
06/05/17  pt came by the office and asked to change becuase the 11:15 time is to close to lunch for her and she sometimes gets jittery

## 2017-06-06 ENCOUNTER — Other Ambulatory Visit: Payer: Self-pay | Admitting: Family Medicine

## 2017-06-06 DIAGNOSIS — R928 Other abnormal and inconclusive findings on diagnostic imaging of breast: Secondary | ICD-10-CM

## 2017-06-09 ENCOUNTER — Ambulatory Visit (HOSPITAL_COMMUNITY): Payer: Medicare Other | Admitting: Physical Therapy

## 2017-06-12 LAB — COLOGUARD: COLOGUARD: NEGATIVE

## 2017-06-13 ENCOUNTER — Ambulatory Visit (HOSPITAL_COMMUNITY): Payer: Medicare Other | Attending: Family Medicine

## 2017-06-13 DIAGNOSIS — R262 Difficulty in walking, not elsewhere classified: Secondary | ICD-10-CM | POA: Diagnosis present

## 2017-06-13 DIAGNOSIS — M25662 Stiffness of left knee, not elsewhere classified: Secondary | ICD-10-CM | POA: Diagnosis present

## 2017-06-13 DIAGNOSIS — M25562 Pain in left knee: Secondary | ICD-10-CM | POA: Diagnosis not present

## 2017-06-13 NOTE — Therapy (Signed)
[place], [place], [address] Phone: [phone]   Fax:  [phone]  Physical Therapy Evaluation  Patient Details  Name: Amanda Mckay MRN: [id number] Date of Birth: 01/28/1946 Referring Provider: Caren Macadam    Encounter Date: 06/13/2017  PT End of Session - 06/13/17 1028    Visit Number  1    Number of Visits  16    Date for PT Re-Evaluation  08/13/17 reassessment/FOTO: 07/14/17    Authorization Type  [employer] medicare    Authorization Time Period  06/13/17-08/13/17    PT Start Time  0901    PT Stop Time  6811    PT Time Calculation (min)  46 min    Activity Tolerance  Patient tolerated treatment well;No increased pain    Behavior During Therapy  WFL for tasks assessed/performed       Past Medical History:  Diagnosis Date  . Atrial fibrillation ([place])   . Iron excess     Past Surgical History:  Procedure Laterality Date  . CESAREAN SECTION    . TONSILLECTOMY      There were no vitals filed for this visit.   Subjective Assessment - 06/13/17 0910    Subjective  Pt reports having some knee pain several years ago, saw an orthopedist who diagnoses knee OA, took PT and DC "fully recovered" about 5-6 years ago. Started having pain again, stiffness, difficulty walking about 3 weeks ago. Pt recently moved here from the [place] area.     Pertinent History  Pt reports having some knee pain several years ago, saw an orthopedist who diagnoses knee OA, took PT and DC "fully recovered" about 5-6 years ago. Started having pain again, stiffness, difficulty walking about 3 weeks ago. Pt recently moved here from the [place] area.     How long can you sit comfortably?  Not limited     How long can you stand comfortably?  About 10 minutes    How long can you walk comfortably?  Is performing all activity, but mayu need additional support and/or rest breaks.     Diagnostic tests  None performed     Patient Stated Goals  Be  able to perform all leisure activity withoutlimitation, enjoy her new home and garden    Currently in Pain?  Yes    Pain Radiating Towards  Left anterior knee with referral into the left anteriolateral proximal anteleg    Pain Onset  1 to 4 weeks ago    Aggravating Factors   standing,    Pain Relieving Factors  Will sit if in pain; ibuprophen wiorks better than acetomenophen, but is advised not to take          [facility] PT Assessment - 06/13/17 0001      Assessment   Medical Diagnosis  Left knee Pain/stiffness    Referring Provider  Caren Macadam     Onset Date/Surgical Date  05/23/17 acute on chronic exacerbation    Hand Dominance  Right    Next MD Visit  -- as needed    Prior Therapy  about 5 years      Precautions   Precautions  None      Restrictions   Weight Bearing Restrictions  No      Balance Screen   Has the patient fallen in the past 6 months  No    Has the patient had a decrease in activity level because of a fear of falling?  Yes    Is the patient reluctant to leave their home because of a fear of falling?   No      Prior Function   Level of Independence  Independent with basic ADLs;Independent with community mobility with device    Vocation  Retired      Observation/Other Assessments   Focus on Therapeutic Outcomes (FOTO)   FOTO: 50 (50% impaired)       ROM / Strength   AROM / PROM / Strength  PROM;Strength      PROM   Right/Left Knee  Right;Left    Left Knee Extension  30 degreees    Left Knee Flexion  98 degrees      Transfers   Five time sit to stand comments   unable to perform from standard height 9.75sec; chair+airex, hands-free      Ambulation/Gait   Ambulation Distance (Feet)  225 Feet    Gait Pattern  -- antalgic, flexed Left knee, compensated Left trendelenburg    Gait velocity  0.70m/s     Gait Comments  "feeling pretty good today"              No data recorded  Objective measurements completed on examination: See above findings.       [facility] Adult PT Treatment/Exercise - 06/13/17 0001      Exercises   Exercises  Knee/Hip      Knee/Hip Exercises: Seated   Long Arc Quad  1 set;10 reps      Knee/Hip Exercises: Supine   Heel Slides  AROM;20 reps;Left 3secH flexion and TKE    Bridges  AROM;Both;1 set;10 reps    Other Supine Knee/Hip Exercises  LAQ, x5 secH, in supine today passive HS stretching             PT Education - 06/13/17 1028    Education provided  Yes    Education Details  high freqeuncy low intensity activity for starter HEP     Person(s) Educated  Patient    Methods  Explanation;Demonstration    Comprehension  Need further instruction       PT Short Term Goals - 06/13/17 1226      PT SHORT TERM GOAL #1   Title  After 4 weeks patient will demonstrate improved activity tolerance AEB tolerance of 3MWT without increased pain, averaging gait speed >0.78m/s.     Status  New    Target Date  07/14/17      PT SHORT TERM GOAL #2   Title  After 4 weeks patient will demonstrate improved functional strength AEB 5xSTS in <18sec hands free from 17" high chair.     Status  New    Target Date  07/04/17      PT SHORT TERM GOAL #3   Title  After 4 weeks patient will demonstrate Left knee ROM >25-110 degrees    Status  New    Target Date  07/04/17        PT Long Term Goals - 06/13/17 1229      PT LONG TERM GOAL #1   Title  After 8 weeks patient will demonstrate improved activity tolerance AEB tolerance of 6MWT without increased pain, averaging gait speed >1.61m/s.     Status  New    Target Date  08/13/17      PT LONG TERM GOAL #2   Title  After 8 weeks patient will demonstrate improved functional strength AEB 5xSTS in <15sec hands free from 17" high chair.  Status  New    Target Date  08/13/17      PT LONG TERM GOAL #3   Title  After 8 weeks patient will demonstrate Left knee ROM >17-115 degrees.     Status  New    Target Date  08/13/17             Plan - 06/13/17 1029     Clinical Impression Statement  Pt presenting d/t Left knee stiffness pain, found to have significant ROM limitations, funcitonal strength deficits, and gait abnormality. Discussed with patient noted area suspicious for baker's cyst phenomenon adn will continue to monitor. Limited progress may warrant updated referral to orthopedist, as she has not had one in 5-6 years, nor any recen tupdated films/scans.     Clinical Presentation  Evolving    Clinical Presentation due to:  objective measures.     Clinical Decision Making  Moderate    Rehab Potential  Fair    Clinical Impairments Affecting Rehab Potential  chronicity of problem, low prior level of activity     PT Frequency  2x / week    PT Duration  8 weeks    PT Treatment/Interventions  Electrical Stimulation;Cryotherapy;Moist Heat;Stair training;Functional mobility training;Therapeutic activities;Therapeutic exercise;Patient/family education;Balance training;Manual techniques;Passive range of motion;Visual/perceptual remediation/compensation    PT Next Visit Plan  review HEP, update to include heel raises seated for full ROM, treatment goals.     PT Home Exercise Plan  *See handout     Consulted and Agree with Plan of Care  Patient       Patient will benefit from skilled therapeutic intervention in order to improve the following deficits and impairments:  Abnormal gait, Decreased balance, Decreased mobility, Difficulty walking, Increased muscle spasms, Decreased range of motion, Increased edema, Decreased knowledge of precautions, Decreased activity tolerance, Decreased strength, Impaired flexibility, Pain, Obesity  Visit Diagnosis: Acute pain of left knee  Stiffness of left knee, not elsewhere classified  Difficulty in walking, not elsewhere classified     Problem List Patient Active Problem List   Diagnosis Date Noted  . Mixed hyperlipidemia 02/27/2017  . Vitamin D deficiency 02/27/2017  . Hereditary hemochromatosis ([place])  02/27/2017  . Atrial fibrillation ([place]) 02/27/2017   12:35 PM, 06/13/17 Etta Grandchild, PT, DPT Physical Therapist - [place] [phone] (Office)   Etta Grandchild 06/13/2017, 12:35 PM  [place] [address] [place], [place], [address] Phone: [phone]   Fax:  [phone]  Name: Rudine Rieger MRN: [id number] Date of Birth: 09-06-1945

## 2017-06-16 ENCOUNTER — Other Ambulatory Visit: Payer: Self-pay

## 2017-06-16 ENCOUNTER — Encounter (HOSPITAL_COMMUNITY): Payer: Self-pay

## 2017-06-16 ENCOUNTER — Ambulatory Visit (HOSPITAL_COMMUNITY): Payer: Medicare Other | Attending: Family Medicine

## 2017-06-16 DIAGNOSIS — R262 Difficulty in walking, not elsewhere classified: Secondary | ICD-10-CM

## 2017-06-16 DIAGNOSIS — M25562 Pain in left knee: Secondary | ICD-10-CM | POA: Diagnosis not present

## 2017-06-16 DIAGNOSIS — M25662 Stiffness of left knee, not elsewhere classified: Secondary | ICD-10-CM | POA: Diagnosis present

## 2017-06-16 NOTE — Therapy (Signed)
[place], [place], [address] Phone: [phone]   Fax:  [phone]  Physical Therapy Treatment  Patient Details  Name: Amanda Mckay MRN: [id number] Date of Birth: 1945-08-26 Referring Provider: Caren Macadam    Encounter Date: 06/16/2017  PT End of Session - 06/16/17 0914    Visit Number  2    Number of Visits  16    Date for PT Re-Evaluation  08/13/17 reassessment/FOTO: 07/14/17    Authorization Type  [employer] medicare    Authorization Time Period  06/13/17-08/13/17    PT Start Time  0903    PT Stop Time  0945    PT Time Calculation (min)  42 min    Activity Tolerance  Patient tolerated treatment well;No increased pain    Behavior During Therapy  WFL for tasks assessed/performed       Past Medical History:  Diagnosis Date  . Atrial fibrillation ([place])   . Iron excess     Past Surgical History:  Procedure Laterality Date  . CESAREAN SECTION    . TONSILLECTOMY      There were no vitals filed for this visit.  Subjective Assessment - 06/16/17 0909    Subjective  Patient reports she is not having too much pain today but can tell she is not walking as well as she has. She states she worked on her HEP over the weekend and even tried to do sit to stands from a normal chair without using her hands too much.     Pertinent History  Pt reports having some knee pain several years ago, saw an orthopedist who diagnoses knee OA, took PT and DC "fully recovered" about 5-6 years ago. Started having pain again, stiffness, difficulty walking about 3 weeks ago. Pt recently moved here from the [place] area.     Currently in Pain?  Yes    Pain Score  2     Pain Location  Knee    Pain Orientation  Left    Pain Descriptors / Indicators  Aching;Dull    Pain Type  Chronic pain    Pain Onset  1 to 4 weeks ago    Pain Frequency  Constant        No data recorded     [facility] Adult PT Treatment/Exercise - 06/16/17 0001      Knee/Hip Exercises: Stretches   Active Hamstring Stretch  Left;3 reps;30 seconds;Limitations    Active Hamstring Stretch Limitations  supine with rope    Passive Hamstring Stretch  Left;3 reps;30 seconds;Limitations    Passive Hamstring Stretch Limitations  8" box with pressure on anterior thigh proximal to knee    Knee: Self-Stretch to increase Flexion  Left;3 reps;30 seconds;Limitations    Knee: Self-Stretch Limitations  12" step      Knee/Hip Exercises: Standing   Other Standing Knee Exercises  heel raises 1x 10 reps      Knee/Hip Exercises: Seated   Long Arc Quad  1 set;10 reps;Left    Other Seated Knee/Hip Exercises  heel raises, 1x 15 reps      Knee/Hip Exercises: Supine   Short Arc Quad Sets  Left;2 sets;10 reps    Heel Slides  AROM;Left;2 sets;15 reps    Bridges  AROM;Both;10 reps;2 sets        PT Education - 06/16/17 0912    Education provided  Yes    Education Details  Educated on evala nd goals. Educated on exercise form/technique throughout and  reveiwed HEP.    Person(s) Educated  Patient    Methods  Explanation;Handout    Comprehension  Verbalized understanding;Returned demonstration       PT Short Term Goals - 06/13/17 1226      PT SHORT TERM GOAL #1   Title  After 4 weeks patient will demonstrate improved activity tolerance AEB tolerance of 3MWT without increased pain, averaging gait speed >0.20m/s.     Status  New    Target Date  07/14/17      PT SHORT TERM GOAL #2   Title  After 4 weeks patient will demonstrate improved functional strength AEB 5xSTS in <18sec hands free from 17" high chair.     Status  New    Target Date  07/04/17      PT SHORT TERM GOAL #3   Title  After 4 weeks patient will demonstrate Left knee ROM >25-110 degrees    Status  New    Target Date  07/04/17        PT Long Term Goals - 06/13/17 1229      PT LONG TERM GOAL #1   Title  After 8 weeks patient will demonstrate improved activity tolerance AEB tolerance of 6MWT without  increased pain, averaging gait speed >1.56m/s.     Status  New    Target Date  08/13/17      PT LONG TERM GOAL #2   Title  After 8 weeks patient will demonstrate improved functional strength AEB 5xSTS in <15sec hands free from 17" high chair.     Status  New    Target Date  08/13/17      PT LONG TERM GOAL #3   Title  After 8 weeks patient will demonstrate Left knee ROM >17-115 degrees.     Status  New    Target Date  08/13/17        Plan - 06/16/17 0915    Clinical Impression Statement  Start of session spent on reviewing initial evaluation and goals with patient. Initiated POC with focus on ROM exercises and stretching for Lt knee. Reviewed HEP exercises today patient required minimal verbal cues to achieve correct form. HEP was updated with heel raises and she will benefit from additional stretches for ROM next session. She will continue to benefit from skilled PT services to address remaining deficits and progress ROM to improve functional mobility.    Rehab Potential  Fair    Clinical Impairments Affecting Rehab Potential  chronicity of problem, low prior level of activity     PT Frequency  2x / week    PT Duration  8 weeks    PT Treatment/Interventions  Electrical Stimulation;Cryotherapy;Moist Heat;Stair training;Functional mobility training;Therapeutic activities;Therapeutic exercise;Patient/family education;Balance training;Manual techniques;Passive range of motion;Visual/perceptual remediation/compensation    PT Next Visit Plan  Progress ROM exercises and update HEP with flexion and extension stretches next session.Assess joint mobilit and provide manual PRN for joint limitations, soft tissue restrictions, and pain.     PT Home Exercise Plan  Eval: bridge, supine/seated heel slide, supine/seated LAQ; 06/16/17 - seated/standing heel raises    Consulted and Agree with Plan of Care  Patient       Patient will benefit from skilled therapeutic intervention in order to improve the  following deficits and impairments:  Abnormal gait, Decreased balance, Decreased mobility, Difficulty walking, Increased muscle spasms, Decreased range of motion, Increased edema, Decreased knowledge of precautions, Decreased activity tolerance, Decreased strength, Impaired flexibility, Pain, Obesity  Visit Diagnosis: Acute pain  of left knee  Stiffness of left knee, not elsewhere classified  Difficulty in walking, not elsewhere classified     Problem List Patient Active Problem List   Diagnosis Date Noted  . Mixed hyperlipidemia 02/27/2017  . Vitamin D deficiency 02/27/2017  . Hereditary hemochromatosis ([place]) 02/27/2017  . Atrial fibrillation ([place]) 02/27/2017    Kipp Brood, PT, DPT Physical Therapist with [facility]  06/16/2017 12:24 PM    [place], [place], [address] Phone: [phone]   Fax:  [phone]  Name: Amanda Mckay MRN: [id number] Date of Birth: 1946-01-05

## 2017-06-16 NOTE — Patient Instructions (Signed)
   HEEL RAISES - PLANTARFLEXION: 1-2 sets of 10-20 repetitions  Start with your entire foot on the ground.  Next, raise up your heels as you press your toes down.  Keep your toes on the ground the entire time.     STANDING HEEL RAISES: 1-2 sets of 10-20 repetitions  While standing, raise up on your toes as you lift your heels off the ground.

## 2017-06-19 ENCOUNTER — Encounter (HOSPITAL_COMMUNITY): Payer: Self-pay

## 2017-06-19 ENCOUNTER — Ambulatory Visit (HOSPITAL_COMMUNITY): Payer: Medicare Other

## 2017-06-19 DIAGNOSIS — M25662 Stiffness of left knee, not elsewhere classified: Secondary | ICD-10-CM

## 2017-06-19 DIAGNOSIS — R262 Difficulty in walking, not elsewhere classified: Secondary | ICD-10-CM

## 2017-06-19 DIAGNOSIS — M25562 Pain in left knee: Secondary | ICD-10-CM

## 2017-06-19 NOTE — Patient Instructions (Signed)
Hamstring Stretch    With other leg bent, foot flat, grasp right leg and slowly try to straighten knee. Hold 30 seconds. Repeat 3 times. Do 1-2 sessions per day.  http://gt2.exer.us/280   Copyright  VHI. All rights reserved.   Toe / Heel Raise (Sitting)    Sitting, raise heels, then rock back on heels and raise toes. Repeat 10 times.  Copyright  VHI. All rights reserved.   Toe / Heel Raise (Standing)    Standing with support, raise heels, then rock back on heels and raise toes. Repeat 10 times.  Copyright  VHI. All rights reserved.

## 2017-06-19 NOTE — Therapy (Signed)
[place], [place], [address] Phone: [phone]   Fax:  [phone]  Physical Therapy Treatment  Patient Details  Name: Amanda Mckay MRN: [id number] Date of Birth: [birth year] Referring Provider: Caren Macadam    Encounter Date: 06/19/2017  PT End of Session - 06/19/17 0917    Visit Number  3    Number of Visits  16    Date for PT Re-Evaluation  08/13/17 minireassess/FOTO: 07/14/17    Authorization Type  [employer] medicare    Authorization Time Period  06/13/17-08/13/17    PT Start Time  0912    PT Stop Time  0950    PT Time Calculation (min)  38 min    Activity Tolerance  Patient tolerated treatment well;No increased pain    Behavior During Therapy  WFL for tasks assessed/performed       Past Medical History:  Diagnosis Date  . Atrial fibrillation ([place])   . Iron excess     Past Surgical History:  Procedure Laterality Date  . CESAREAN SECTION    . TONSILLECTOMY      There were no vitals filed for this visit.  Subjective Assessment - 06/19/17 0914    Subjective  Pt stated knee is feeling good today, contines to feel stiff but feels looser every day.   Reports compliance iwht HEP daily.    Pertinent History  Pt reports having some knee pain several years ago, saw an orthopedist who diagnoses knee OA, took PT and DC "fully recovered" about 5-6 years ago. Started having pain again, stiffness, difficulty walking about 3 weeks ago. Pt recently moved here from the [place] area.     Patient Stated Goals  Be able to perform all leisure activity withoutlimitation, enjoy her new home and garden    Currently in Pain?  Yes    Pain Score  3     Pain Location  Knee    Pain Orientation  Left    Pain Descriptors / Indicators  Tightness    Pain Type  Chronic pain    Pain Onset  1 to 4 weeks ago    Pain Frequency  Constant    Aggravating Factors   standing    Pain Relieving Factors  will sit if in pain, ibuprophen works  better than acetomenophen, but is advised not to take                       [facility] Adult PT Treatment/Exercise - 06/19/17 0001      Knee/Hip Exercises: Stretches   Knee: Self-Stretch to increase Flexion  10 seconds;Left knee drive 45XM step 46O 10"    Knee: Self-Stretch Limitations  12" step      Knee/Hip Exercises: Standing   Heel Raises  10 reps;Limitations    Heel Raises Limitations  Toe raises on     Rocker Board  2 minutes lateral and DF/ PF      Knee/Hip Exercises: Seated   Long Arc Quad  1 set;10 reps;Left    Other Seated Knee/Hip Exercises  Toe raises 10x      Knee/Hip Exercises: Supine   Short Arc Quad Sets  15 reps    Heel Slides  10 reps    Bridges  AROM;Both;10 reps;2 sets    Knee Extension  AROM;Limitations    Knee Extension Limitations  18    Knee Flexion  AROM;Limitations    Knee Flexion Limitations  98  Manual Therapy   Manual Therapy  Joint mobilization    Manual therapy comments  Manual complete separate than rest of tx    Joint Mobilization  patella mobs all directions               PT Short Term Goals - 06/13/17 1226      PT SHORT TERM GOAL #1   Title  After 4 weeks patient will demonstrate improved activity tolerance AEB tolerance of 3MWT without increased pain, averaging gait speed >0.22m/s.     Status  New    Target Date  07/14/17      PT SHORT TERM GOAL #2   Title  After 4 weeks patient will demonstrate improved functional strength AEB 5xSTS in <18sec hands free from 17" high chair.     Status  New    Target Date  07/04/17      PT SHORT TERM GOAL #3   Title  After 4 weeks patient will demonstrate Left knee ROM >25-110 degrees    Status  New    Target Date  07/04/17        PT Long Term Goals - 06/13/17 1229      PT LONG TERM GOAL #1   Title  After 8 weeks patient will demonstrate improved activity tolerance AEB tolerance of 6MWT without increased pain, averaging gait speed >1.23m/s.     Status  New    Target  Date  08/13/17      PT LONG TERM GOAL #2   Title  After 8 weeks patient will demonstrate improved functional strength AEB 5xSTS in <15sec hands free from 17" high chair.     Status  New    Target Date  08/13/17      PT LONG TERM GOAL #3   Title  After 8 weeks patient will demonstrate Left knee ROM >17-115 degrees.     Status  New    Target Date  08/13/17            Plan - 06/19/17 1256    Clinical Impression Statement  Session focus with knee mobility and strengthening to improve gait mechanics.  Assess patella mobility with some restrictions medial and lateral, no reports of pain with movements.  Added hamstring stretches and seated toe raises to HEP to improve flexibilty.  Improved knee extension this session with AROM 18-98 degrees (was 30-98 degrees eval.)    Rehab Potential  Fair    Clinical Impairments Affecting Rehab Potential  chronicity of problem, low prior level of activity     PT Frequency  2x / week    PT Duration  8 weeks    PT Treatment/Interventions  Electrical Stimulation;Cryotherapy;Moist Heat;Stair training;Functional mobility training;Therapeutic activities;Therapeutic exercise;Patient/family education;Balance training;Manual techniques;Passive range of motion;Visual/perceptual remediation/compensation    PT Next Visit Plan  Progress ROM exercises and update HEP with flexion and extension stretches next session.Assess joint mobilit and provide manual PRN for joint limitations, soft tissue restrictions, and pain.     PT Home Exercise Plan  Eval: bridge, supine/seated heel slide, supine/seated LAQ; 06/16/17 - seated/standing heel raises; 06/19/17: HS stretch and toe raises.         Patient will benefit from skilled therapeutic intervention in order to improve the following deficits and impairments:  Abnormal gait, Decreased balance, Decreased mobility, Difficulty walking, Increased muscle spasms, Decreased range of motion, Increased edema, Decreased knowledge of  precautions, Decreased activity tolerance, Decreased strength, Impaired flexibility, Pain, Obesity  Visit Diagnosis: Acute pain of left  knee  Stiffness of left knee, not elsewhere classified  Difficulty in walking, not elsewhere classified     Problem List Patient Active Problem List   Diagnosis Date Noted  . Mixed hyperlipidemia 02/27/2017  . Vitamin D deficiency 02/27/2017  . Hereditary hemochromatosis ([place]) 02/27/2017  . Atrial fibrillation [facility]) 02/27/2017   Ihor Austin, [place]; [place]  Aldona Lento 06/19/2017, 1:01 PM  [place], [place], [address] Phone: [phone]   Fax:  [phone]  Name: Amanda Mckay MRN: [id number] Date of Birth: 02-Jan-1946

## 2017-07-02 ENCOUNTER — Ambulatory Visit (HOSPITAL_COMMUNITY): Payer: Medicare Other

## 2017-07-02 ENCOUNTER — Encounter (HOSPITAL_COMMUNITY): Payer: Self-pay

## 2017-07-02 ENCOUNTER — Other Ambulatory Visit: Payer: Self-pay

## 2017-07-02 DIAGNOSIS — M25662 Stiffness of left knee, not elsewhere classified: Secondary | ICD-10-CM

## 2017-07-02 DIAGNOSIS — M25562 Pain in left knee: Secondary | ICD-10-CM

## 2017-07-02 DIAGNOSIS — R262 Difficulty in walking, not elsewhere classified: Secondary | ICD-10-CM

## 2017-07-02 NOTE — Patient Instructions (Signed)
   SHORT ARC QUAD  - SAQ: 1-2 sets of 10-20 repetitions, 3 second holds  Place a rolled up towel or object under your knee and slowly straighten your knee as your raise up  your foot.

## 2017-07-02 NOTE — Therapy (Signed)
[place], [place], [address] Phone: [phone]   Fax:  [phone]  Physical Therapy Treatment  Patient Details  Name: Amanda Mckay MRN: [id number] Date of Birth: Jun 01, 1945 Referring Provider: Caren Macadam    Encounter Date: 07/02/2017  PT End of Session - 07/02/17 1005    Visit Number  4    Number of Visits  16    Date for PT Re-Evaluation  08/13/17 minireassess/FOTO: 07/14/17    Authorization Type  [employer] medicare    Authorization Time Period  06/13/17-08/13/17    Authorization - Visit Number  4    Authorization - Number of Visits  10    PT Start Time  5638    PT Stop Time  0900    PT Time Calculation (min)  43 min    Activity Tolerance  Patient tolerated treatment well;No increased pain    Behavior During Therapy  WFL for tasks assessed/performed       Past Medical History:  Diagnosis Date  . Atrial fibrillation ([place])   . Iron excess     Past Surgical History:  Procedure Laterality Date  . CESAREAN SECTION    . TONSILLECTOMY      There were no vitals filed for this visit.  Subjective Assessment - 07/02/17 0844    Subjective   Patient is feeling well today and reports her knee is not aching too bad, about a 3/10. She reports she did some gardening in the last couple of days and it made her hips and legs sore. She states she no longer can get down onto her hands and knees and just bends over to garden now.    Pertinent History  Pt reports having some knee pain several years ago, saw an orthopedist who diagnoses knee OA, took PT and DC "fully recovered" about 5-6 years ago. Started having pain again, stiffness, difficulty walking about 3 weeks ago. Pt recently moved here from the [place] area.     Patient Stated Goals  Be able to perform all leisure activity withoutlimitation, enjoy her new home and garden    Currently in Pain?  Yes    Pain Score  3     Pain Location  Knee    Pain Orientation  Left    Pain Descriptors / Indicators  Aching;Tightness    Pain Type  Chronic pain    Pain Onset  More than a month ago    Pain Frequency  Constant         [facility] Adult PT Treatment/Exercise - 07/02/17 0001      Knee/Hip Exercises: Stretches   Active Hamstring Stretch  Left;3 reps;30 seconds;Limitations    Active Hamstring Stretch Limitations  supine with rope    Passive Hamstring Stretch  Left;3 reps;30 seconds;Limitations    Passive Hamstring Stretch Limitations  12" box with pressure on anterior thigh proximal to knee    Knee: Self-Stretch to increase Flexion  Left;3 reps;30 seconds;Limitations    Knee: Self-Stretch Limitations  12" step    Gastroc Stretch  Both;3 reps;30 seconds      Knee/Hip Exercises: Standing   Heel Raises  Limitations;20 reps;Both    Heel Raises Limitations  --    Rocker Board  4 minutes 2x 1 minutes lateral/ 2x 1 minutes DF/PF      Knee/Hip Exercises: Seated   Long Arc Quad  1 set;Left;15 reps;Weights    Other Seated Knee/Hip Exercises  Toe raises1x 20 reps both  Knee/Hip Exercises: Supine   Short Arc Quad Sets  Strengthening;Left;1 set;15 reps;Limitations    Other Supine Knee/Hip Exercises  prolonged knee extension stretch (3 minutes) 7.5 lbs on anterior Lt knee to facilitate stretch with garvity, ankle elevated on bolster.      Knee/Hip Exercises: Prone   Contract/Relax to Increase Flexion  3x 5-8 second  holds at end range, 30 second relax with passive stretch from therapist at end range (increasing ROM each repetition)        PT Education - 07/02/17 0900    Education provided  Yes    Education Details  Educated on exercises and stretches throughout. Provided new HEP exercise.    Person(s) Educated  Patient    Methods  Handout;Explanation    Comprehension  Verbalized understanding;Returned demonstration       PT Short Term Goals - 06/13/17 1226      PT SHORT TERM GOAL #1   Title  After 4 weeks patient will demonstrate improved activity  tolerance AEB tolerance of 3MWT without increased pain, averaging gait speed >0.77m/s.     Status  New    Target Date  07/14/17      PT SHORT TERM GOAL #2   Title  After 4 weeks patient will demonstrate improved functional strength AEB 5xSTS in <18sec hands free from 17" high chair.     Status  New    Target Date  07/04/17      PT SHORT TERM GOAL #3   Title  After 4 weeks patient will demonstrate Left knee ROM >25-110 degrees    Status  New    Target Date  07/04/17        PT Long Term Goals - 06/13/17 1229      PT LONG TERM GOAL #1   Title  After 8 weeks patient will demonstrate improved activity tolerance AEB tolerance of 6MWT without increased pain, averaging gait speed >1.48m/s.     Status  New    Target Date  08/13/17      PT LONG TERM GOAL #2   Title  After 8 weeks patient will demonstrate improved functional strength AEB 5xSTS in <15sec hands free from 17" high chair.     Status  New    Target Date  08/13/17      PT LONG TERM GOAL #3   Title  After 8 weeks patient will demonstrate Left knee ROM >17-115 degrees.     Status  New    Target Date  08/13/17        Plan - 07/02/17 1004    Clinical Impression Statement  Today's session continued with current POC with focus on ROM and strengthening exercises for Lt LE. Patient continued with established stretches with good carryover for from prior sessions. Contract relax stretch was added for knee flexion and patient reported decreased stiffens following intervention as well as prolonged knee extension stretch with 7.5 lbs for AP pressure above the knee joint in supine. She will continue to benefit from skilled PT services to address ROM and mobility deficits and progress towards goals.     Rehab Potential  Fair    Clinical Impairments Affecting Rehab Potential  chronicity of problem, low prior level of activity     PT Frequency  2x / week    PT Duration  8 weeks    PT Treatment/Interventions  Electrical  Stimulation;Cryotherapy;Moist Heat;Stair training;Functional mobility training;Therapeutic activities;Therapeutic exercise;Patient/family education;Balance training;Manual techniques;Passive range of motion;Visual/perceptual remediation/compensation    PT Next  Visit Plan  Continue with contract relax for quad and introduce for hamstring as well. Progress ROM exercises and update HEP with flexion and extension stretches next session. Assess joint mobility and provide manual PRN for joint limitations, soft tissue restrictions, and pain.    PT Home Exercise Plan  Eval: bridge, supine/seated heel slide, supine/seated LAQ; 06/16/17 - seated/standing heel raises; 06/19/17: HS stretch and toe raises.      Consulted and Agree with Plan of Care  Patient       Patient will benefit from skilled therapeutic intervention in order to improve the following deficits and impairments:  Abnormal gait, Decreased balance, Decreased mobility, Difficulty walking, Increased muscle spasms, Decreased range of motion, Increased edema, Decreased knowledge of precautions, Decreased activity tolerance, Decreased strength, Impaired flexibility, Pain, Obesity  Visit Diagnosis: Acute pain of left knee  Stiffness of left knee, not elsewhere classified  Difficulty in walking, not elsewhere classified     Problem List Patient Active Problem List   Diagnosis Date Noted  . Mixed hyperlipidemia 02/27/2017  . Vitamin D deficiency 02/27/2017  . Hereditary hemochromatosis ([place]) 02/27/2017  . Atrial fibrillation ([place]) 02/27/2017    Kipp Brood, PT, DPT Physical Therapist with [facility]  07/02/2017 10:06 AM    [place], [place], [address] Phone: [phone]   Fax:  [phone]  Name: Gia Lusher MRN: [id number] Date of Birth: 17-Sep-1945

## 2017-07-04 ENCOUNTER — Encounter (HOSPITAL_COMMUNITY): Payer: Self-pay

## 2017-07-04 ENCOUNTER — Ambulatory Visit (HOSPITAL_COMMUNITY): Payer: Medicare Other

## 2017-07-04 DIAGNOSIS — M25562 Pain in left knee: Secondary | ICD-10-CM

## 2017-07-04 DIAGNOSIS — M25662 Stiffness of left knee, not elsewhere classified: Secondary | ICD-10-CM

## 2017-07-04 DIAGNOSIS — R262 Difficulty in walking, not elsewhere classified: Secondary | ICD-10-CM

## 2017-07-04 NOTE — Therapy (Signed)
[place], [place], [address] Phone: [phone]   Fax:  [phone]  Physical Therapy Treatment  Patient Details  Name: Amanda Mckay MRN: [id number] Date of Birth: 10-18-1945 Referring Provider: Caren Macadam   Encounter Date: 07/04/2017  PT End of Session - 07/04/17 0823    Visit Number  5    Number of Visits  16    Date for PT Re-Evaluation  08/13/17 Minireassess/FOTO: 07/14/17    Authorization Type  [employer] medicare    Authorization Time Period  06/13/17-08/13/17    Authorization - Visit Number  5    Authorization - Number of Visits  10    PT Start Time  0818    PT Stop Time  0903  (Pended)     PT Time Calculation (min)  45 min  (Pended)     Activity Tolerance  Patient tolerated treatment well;No increased pain    Behavior During Therapy  WFL for tasks assessed/performed       Past Medical History:  Diagnosis Date  . Atrial fibrillation ([place])   . Iron excess     Past Surgical History:  Procedure Laterality Date  . CESAREAN SECTION    . TONSILLECTOMY      There were no vitals filed for this visit.  Subjective Assessment - 07/04/17 0819    Subjective  Pt stated knee is feeling good today, stated she would feel the rain coming in last night, current pain scale 2/10 mainly stiffness today.  Continues to have difficulty sleeping at night, hard to get positioned    Pertinent History  Pt reports having some knee pain several years ago, saw an orthopedist who diagnoses knee OA, took PT and DC "fully recovered" about 5-6 years ago. Started having pain again, stiffness, difficulty walking about 3 weeks ago. Pt recently moved here from the [place] area.     Patient Stated Goals  Be able to perform all leisure activity withoutlimitation, enjoy her new home and garden    Currently in Pain?  Yes    Pain Score  2     Pain Location  Knee    Pain Orientation  Left    Pain Descriptors / Indicators  Aching;Tightness    Pain Type  Chronic pain    Pain Radiating Towards  Left anterior knee wiht referral into the left anteriolateral proximal anteleg    Pain Onset  More than a month ago    Pain Frequency  Constant    Aggravating Factors   standing    Pain Relieving Factors  will sit if in pain, ibuprophen works better than acetomenophen, but is advised not to take         [facility] PT Assessment - 07/04/17 0001      Assessment   Medical Diagnosis  Left knee Pain/stiffness    Referring Provider  Caren Macadam    Onset Date/Surgical Date  05/23/17 acute on chronic exacerbation    Hand Dominance  Right    Next MD Visit  -- as needed    Prior Therapy  about 5 years                   [facility] Adult PT Treatment/Exercise - 07/04/17 0001      Knee/Hip Exercises: Stretches   Passive Hamstring Stretch  Left;3 reps;30 seconds;Limitations    Passive Hamstring Stretch Limitations  12" box with pressure on anterior thigh proximal to knee    Knee: Self-Stretch to  increase Flexion  Left;5 reps;10 seconds    Knee: Self-Stretch Limitations  knee drives on 42VZ step    Gastroc Stretch  Both;3 reps;30 seconds slant board      Knee/Hip Exercises: Standing   Heel Raises  Limitations;20 reps;Both    Heel Raises Limitations  Toe raises on slope, restricted    Terminal Knee Extension  Left;10 reps manual resistance    Rocker Board  2 minutes lateral      Knee/Hip Exercises: Supine   Short Arc Quad Sets  Strengthening;Left;1 set;15 reps;Limitations    Knee Extension  AROM;Limitations    Knee Extension Limitations  19    Knee Flexion  AROM;Limitations    Knee Flexion Limitations  100      Knee/Hip Exercises: Prone   Contract/Relax to Increase Flexion  5x 10" hold for extension (towel rolled under knee and mat dropped during) and flexion      Manual Therapy   Manual Therapy  Joint mobilization    Manual therapy comments  Manual complete separate than rest of tx    Joint Mobilization  patella mobs all  directions               PT Short Term Goals - 06/13/17 1226      PT SHORT TERM GOAL #1   Title  After 4 weeks patient will demonstrate improved activity tolerance AEB tolerance of 3MWT without increased pain, averaging gait speed >0.67m/s.     Status  New    Target Date  07/14/17      PT SHORT TERM GOAL #2   Title  After 4 weeks patient will demonstrate improved functional strength AEB 5xSTS in <18sec hands free from 17" high chair.     Status  New    Target Date  07/04/17      PT SHORT TERM GOAL #3   Title  After 4 weeks patient will demonstrate Left knee ROM >25-110 degrees    Status  New    Target Date  07/04/17        PT Long Term Goals - 06/13/17 1229      PT LONG TERM GOAL #1   Title  After 8 weeks patient will demonstrate improved activity tolerance AEB tolerance of 6MWT without increased pain, averaging gait speed >1.35m/s.     Status  New    Target Date  08/13/17      PT LONG TERM GOAL #2   Title  After 8 weeks patient will demonstrate improved functional strength AEB 5xSTS in <15sec hands free from 17" high chair.     Status  New    Target Date  08/13/17      PT LONG TERM GOAL #3   Title  After 8 weeks patient will demonstrate Left knee ROM >17-115 degrees.     Status  New    Target Date  08/13/17            Plan - 07/04/17 0857    Clinical Impression Statement  Continued with session foucs on knee mobility and strengthening exercises for Lt LE.  Pt tolerated well with stretches, able to demonstrate good form and mechanics with only initialy cueing for form.  Continued with contract relax techniques wiht additional hamstring for extension and flexion.  AROM 19-100 degrees.  Reports of reduced stiffness and no pain at EOS.      Rehab Potential  Fair    Clinical Impairments Affecting Rehab Potential  chronicity of problem, low prior level  of activity     PT Frequency  2x / week    PT Duration  8 weeks    PT Treatment/Interventions  Electrical  Stimulation;Cryotherapy;Moist Heat;Stair training;Functional mobility training;Therapeutic activities;Therapeutic exercise;Patient/family education;Balance training;Manual techniques;Passive range of motion;Visual/perceptual remediation/compensation    PT Next Visit Plan  Continue with contract relax for quad and hamstrings as well. Progress ROM exercises and update HEP with flexion and extension stretches next session. Assess joint mobility and provide manual PRN for joint limitations, soft tissue restrictions, and pain.    PT Home Exercise Plan  Eval: bridge, supine/seated heel slide, supine/seated LAQ; 06/16/17 - seated/standing heel raises; 06/19/17: HS stretch and toe raises.         Patient will benefit from skilled therapeutic intervention in order to improve the following deficits and impairments:  Abnormal gait, Decreased balance, Decreased mobility, Difficulty walking, Increased muscle spasms, Decreased range of motion, Increased edema, Decreased knowledge of precautions, Decreased activity tolerance, Decreased strength, Impaired flexibility, Pain, Obesity  Visit Diagnosis: Acute pain of left knee  Stiffness of left knee, not elsewhere classified  Difficulty in walking, not elsewhere classified     Problem List Patient Active Problem List   Diagnosis Date Noted  . Mixed hyperlipidemia 02/27/2017  . Vitamin D deficiency 02/27/2017  . Hereditary hemochromatosis ([place]) 02/27/2017  . Atrial fibrillation [facility]) 02/27/2017   Ihor Austin, LPTA; [place]  Aldona Lento 07/04/2017, 3:59 PM  [place], [place], [address] Phone: [phone]   Fax:  [phone]  Name: Amanda Mckay MRN: [id number] Date of Birth: [birth year]

## 2017-07-07 ENCOUNTER — Other Ambulatory Visit: Payer: Self-pay

## 2017-07-07 ENCOUNTER — Ambulatory Visit (HOSPITAL_COMMUNITY): Payer: Medicare Other

## 2017-07-07 ENCOUNTER — Encounter (HOSPITAL_COMMUNITY): Payer: Self-pay

## 2017-07-07 DIAGNOSIS — R262 Difficulty in walking, not elsewhere classified: Secondary | ICD-10-CM

## 2017-07-07 DIAGNOSIS — M25662 Stiffness of left knee, not elsewhere classified: Secondary | ICD-10-CM

## 2017-07-07 DIAGNOSIS — M25562 Pain in left knee: Secondary | ICD-10-CM

## 2017-07-07 NOTE — Therapy (Signed)
[place], [place], [address] Phone: [phone]   Fax:  [phone]  Physical Therapy Treatment  Patient Details  Name: Amanda Mckay MRN: [id number] Date of Birth: 02/14/1946 Referring Provider: Caren Macadam   Encounter Date: 07/07/2017  PT End of Session - 07/07/17 0911    Visit Number  6    Number of Visits  16    Date for PT Re-Evaluation  08/13/17 Minireassess/FOTO: 07/14/17    Authorization Type  [employer] medicare    Authorization Time Period  06/13/17-08/13/17    Authorization - Visit Number  6    Authorization - Number of Visits  10    PT Start Time  0903    PT Stop Time  0945    PT Time Calculation (min)  42 min    Activity Tolerance  Patient tolerated treatment well;No increased pain    Behavior During Therapy  WFL for tasks assessed/performed       Past Medical History:  Diagnosis Date  . Atrial fibrillation ([place])   . Iron excess     Past Surgical History:  Procedure Laterality Date  . CESAREAN SECTION    . TONSILLECTOMY      There were no vitals filed for this visit.  Subjective Assessment - 07/07/17 0904    Subjective  Patient is feeling well today. She reports she had a nice Easter and her daughters came to visit her for the holiday. She states her grandson is coming to visit this coming weekend. She reports she did no keep up with her HEP this weekend but reports she did do some yard work and gardening this weekend and states that made it feel better.    Pertinent History  Pt reports having some knee pain several years ago, saw an orthopedist who diagnoses knee OA, took PT and DC "fully recovered" about 5-6 years ago. Started having pain again, stiffness, difficulty walking about 3 weeks ago. Pt recently moved here from the [place] area.     Patient Stated Goals  Be able to perform all leisure activity withoutlimitation, enjoy her new home and garden    Currently in Pain?  Yes    Pain Score  1      Pain Location  Knee    Pain Orientation  Left    Pain Descriptors / Indicators  Aching    Pain Type  Chronic pain    Pain Onset  More than a month ago    Pain Frequency  Constant       [facility] Adult PT Treatment/Exercise - 07/07/17 0001      Knee/Hip Exercises: Stretches   Passive Hamstring Stretch  Left;3 reps;30 seconds;Limitations    Passive Hamstring Stretch Limitations  12" box with pressure on anterior thigh proximal to knee    Knee: Self-Stretch to increase Flexion  Left;3 reps;30 seconds    Knee: Self-Stretch Limitations  knee drives on 75ZW step      Knee/Hip Exercises: Standing   Heel Raises  Limitations;20 reps;Both    Terminal Knee Extension  -- attempted, patient had too much difficulty with form      Knee/Hip Exercises: Seated   Long Arc Quad  Left;Weights;2 sets;20 reps    Long Arc Quad Weight  3 lbs.    Other Seated Knee/Hip Exercises  Toe raises1x 20 reps both       Knee/Hip Exercises: Supine   Short Arc Quad Sets  Strengthening;Left;Limitations;10 reps;2 sets    Short  Arc Quad Sets Limitations  3lbs    Heel Slides  Left;1 set;20 reps;Limitations    Heel Slides Limitations  5 second holds    Knee Extension  AROM;Limitations    Knee Extension Limitations  15    Knee Flexion  AROM;Limitations    Knee Flexion Limitations  104      Knee/Hip Exercises: Prone   Contract/Relax to Increase Flexion  3x 5-8 second  holds at end range, 30 second relax with passive stretch from therapist at end range (increasing ROM each repetition)      Manual Therapy   Manual Therapy  Joint mobilization    Manual therapy comments  Manual complete separate than rest of tx    Joint Mobilization  patella mobs all directions; grade III; 3x 30-45 seconds each direction        PT Education - 07/07/17 0913    Education provided  Yes    Education Details  Educated on exercises throughout session and updated HEP.    Person(s) Educated  Patient    Methods  Explanation;Handout     Comprehension  Verbalized understanding       PT Short Term Goals - 06/13/17 1226      PT SHORT TERM GOAL #1   Title  After 4 weeks patient will demonstrate improved activity tolerance AEB tolerance of 3MWT without increased pain, averaging gait speed >0.66m/s.     Status  New    Target Date  07/14/17      PT SHORT TERM GOAL #2   Title  After 4 weeks patient will demonstrate improved functional strength AEB 5xSTS in <18sec hands free from 17" high chair.     Status  New    Target Date  07/04/17      PT SHORT TERM GOAL #3   Title  After 4 weeks patient will demonstrate Left knee ROM >25-110 degrees    Status  New    Target Date  07/04/17        PT Long Term Goals - 06/13/17 1229      PT LONG TERM GOAL #1   Title  After 8 weeks patient will demonstrate improved activity tolerance AEB tolerance of 6MWT without increased pain, averaging gait speed >1.5m/s.     Status  New    Target Date  08/13/17      PT LONG TERM GOAL #2   Title  After 8 weeks patient will demonstrate improved functional strength AEB 5xSTS in <15sec hands free from 17" high chair.     Status  New    Target Date  08/13/17      PT LONG TERM GOAL #3   Title  After 8 weeks patient will demonstrate Left knee ROM >17-115 degrees.     Status  New    Target Date  08/13/17        Plan - 07/07/17 0911    Clinical Impression Statement  Today's session continued with focus on ROM and strengthening exercises for Lt LE. Attempted standing TKE today however patient unable to achieve proper form and was reliant on weight shifting her trunk to pull against the theraband so exercise was discontinued. Contract relax stretch was continued today for quad stretch with patient using rope and providing resistance herself. AROM improved today from 15-104 degrees compared to last session being 19-100. She will continue to benefit from skilled PT services to address ROM and mobility deficits and progress towards goals.    Rehab  Potential  Fair    Clinical Impairments Affecting Rehab Potential  chronicity of problem, low prior level of activity     PT Frequency  2x / week    PT Duration  8 weeks    PT Treatment/Interventions  Electrical Stimulation;Cryotherapy;Moist Heat;Stair training;Functional mobility training;Therapeutic activities;Therapeutic exercise;Patient/family education;Balance training;Manual techniques;Passive range of motion;Visual/perceptual remediation/compensation    PT Next Visit Plan  Continue with contract relax for quad and hamstrings as well. Progress ROM exercises, initiate bike or stepper for ROM to decrease stiffness. Perform manual PRN for joint limitations, soft tissue restrictions, and pain.    PT Home Exercise Plan  Eval: bridge, supine/seated heel slide, supine/seated LAQ; 06/16/17 - seated/standing heel raises; 06/19/17: HS stretch and toe raises.  07/07/17 - HS stretch on step, quad stretch on step;     Consulted and Agree with Plan of Care  Patient       Patient will benefit from skilled therapeutic intervention in order to improve the following deficits and impairments:  Abnormal gait, Decreased balance, Decreased mobility, Difficulty walking, Increased muscle spasms, Decreased range of motion, Increased edema, Decreased knowledge of precautions, Decreased activity tolerance, Decreased strength, Impaired flexibility, Pain, Obesity  Visit Diagnosis: Acute pain of left knee  Stiffness of left knee, not elsewhere classified  Difficulty in walking, not elsewhere classified     Problem List Patient Active Problem List   Diagnosis Date Noted  . Mixed hyperlipidemia 02/27/2017  . Vitamin D deficiency 02/27/2017  . Hereditary hemochromatosis ([place]) 02/27/2017  . Atrial fibrillation ([place]) 02/27/2017    Kipp Brood, PT, DPT Physical Therapist with [facility]  07/07/2017 9:55 AM    [place], [place], [address] Phone: [phone]   Fax:  [phone]  Name: Ahnesti Townsend MRN: [id number] Date of Birth: June 05, 1945

## 2017-07-07 NOTE — Patient Instructions (Signed)
   STANDING HAMSTRING STRETCH - PROPPED: 3 times for 30-60 seconds  Start by standing and prop your foot of the affected leg on a chair or a step.   Next, slowly lean forward until a stretch is felt behind your knee/thigh. Bend through your hips and not your spine. Hold, then return to starting position and repeat.      Knee Flexion Stretch on Step: 3 times for 30-60 seconds  Place foot on step and lean forward until you feel a good stretch in front of knee.

## 2017-07-09 ENCOUNTER — Ambulatory Visit (HOSPITAL_COMMUNITY): Payer: Medicare Other

## 2017-07-09 ENCOUNTER — Encounter (HOSPITAL_COMMUNITY): Payer: Self-pay

## 2017-07-09 ENCOUNTER — Other Ambulatory Visit: Payer: Self-pay

## 2017-07-09 DIAGNOSIS — M25562 Pain in left knee: Secondary | ICD-10-CM

## 2017-07-09 DIAGNOSIS — M25662 Stiffness of left knee, not elsewhere classified: Secondary | ICD-10-CM

## 2017-07-09 DIAGNOSIS — R262 Difficulty in walking, not elsewhere classified: Secondary | ICD-10-CM

## 2017-07-09 NOTE — Patient Instructions (Signed)
   Supine Knee Extension: 3-5 minutes with 5-7 lbs bag of rice or ankle weight for stretch  Lying on your back, elevate your foot using a pillow or stack of blankets or anything comfortable, but high enough so your knee is not touching the table. Place a weight over your knee, and just relax. Allow the weight to passively stretch the back of your knee and move it more into extension.

## 2017-07-09 NOTE — Therapy (Signed)
[place], [place], [address] Phone: [phone]   Fax:  [phone]  Physical Therapy Treatment  Patient Details  Name: Amanda Mckay MRN: [id number] Date of Birth: 14-Dec-1945 Referring Provider: Caren Macadam   Encounter Date: 07/09/2017  PT End of Session - 07/09/17 0935    Visit Number  7    Number of Visits  16    Date for PT Re-Evaluation  08/13/17 Minireassess/FOTO: 07/14/17    Authorization Type  [employer] medicare    Authorization Time Period  06/13/17-08/13/17    Authorization - Visit Number  7    Authorization - Number of Visits  10    PT Start Time  0901    PT Stop Time  0932    PT Time Calculation (min)  43 min    Activity Tolerance  Patient tolerated treatment well;No increased pain    Behavior During Therapy  WFL for tasks assessed/performed       Past Medical History:  Diagnosis Date  . Atrial fibrillation ([place])   . Iron excess     Past Surgical History:  Procedure Laterality Date  . CESAREAN SECTION    . TONSILLECTOMY      There were no vitals filed for this visit.  Subjective Assessment - 07/09/17 0905    Subjective  Patient is feeling well today, she states she was not sore after her last session and has continued to perform her HEP regularly.     Pertinent History  Pt reports having some knee pain several years ago, saw an orthopedist who diagnoses knee OA, took PT and DC "fully recovered" about 5-6 years ago. Started having pain again, stiffness, difficulty walking about 3 weeks ago. Pt recently moved here from the [place] area.     How long can you sit comfortably?  Not limited     Patient Stated Goals  Be able to perform all leisure activity withoutlimitation, enjoy her new home and garden    Currently in Pain?  No/denies    Pain Onset  More than a month ago         [facility] Adult PT Treatment/Exercise - 07/09/17 0001      Knee/Hip Exercises: Stretches   Passive Hamstring Stretch   Left;3 reps;30 seconds;Limitations    Passive Hamstring Stretch Limitations  12" box with pressure on anterior thigh proximal to knee    Knee: Self-Stretch to increase Flexion  Left;3 reps;30 seconds    Knee: Self-Stretch Limitations  knee drives on 67TI step    Gastroc Stretch  Both;3 reps;30 seconds slant board      Knee/Hip Exercises: Aerobic   Stationary Bike  4 minutes full revolutions on seat 13, patient with increased speed and ease of mobility durignlast minute      Knee/Hip Exercises: Standing   Heel Raises  Limitations;20 reps;Both    Forward Lunges  Both;1 set;10 reps;Limitations    Forward Lunges Limitations  on 4" box, with Bil UE support      Knee/Hip Exercises: Seated   Long Arc Quad  Weights;20 reps;Both;1 set    [employer] Weight  4 lbs.    Other Seated Knee/Hip Exercises  Toe raises1x 20 reps both       Knee/Hip Exercises: Supine   Knee Extension  AROM;Limitations    Knee Extension Limitations  15    Knee Flexion  AROM;Limitations    Knee Flexion Limitations  103    Other Supine Knee/Hip Exercises  prolonged knee extension stretch (3 minutes) 7.5 lbs on anterior Lt knee to facilitate stretch with garvity, ankle elevated on bolster.      Manual Therapy   Manual Therapy  Joint mobilization    Manual therapy comments  Manual complete separate than rest of tx    Joint Mobilization  3x 30-45 second oscillations grade III/IV to Tibiofemoral joint in AP/PA direction for flexion and extension on Lt knee        PT Education - 07/09/17 0934    Education provided  Yes    Education Details  Educated on exercises throughout session and on purpose of interventions performed today. Updated HEP.    Person(s) Educated  Patient    Methods  Explanation;Handout    Comprehension  Verbalized understanding       PT Short Term Goals - 06/13/17 1226      PT SHORT TERM GOAL #1   Title  After 4 weeks patient will demonstrate improved activity tolerance AEB tolerance of 3MWT  without increased pain, averaging gait speed >0.72m/s.     Status  New    Target Date  07/14/17      PT SHORT TERM GOAL #2   Title  After 4 weeks patient will demonstrate improved functional strength AEB 5xSTS in <18sec hands free from 17" high chair.     Status  New    Target Date  07/04/17      PT SHORT TERM GOAL #3   Title  After 4 weeks patient will demonstrate Left knee ROM >25-110 degrees    Status  New    Target Date  07/04/17        PT Long Term Goals - 06/13/17 1229      PT LONG TERM GOAL #1   Title  After 8 weeks patient will demonstrate improved activity tolerance AEB tolerance of 6MWT without increased pain, averaging gait speed >1.24m/s.     Status  New    Target Date  08/13/17      PT LONG TERM GOAL #2   Title  After 8 weeks patient will demonstrate improved functional strength AEB 5xSTS in <15sec hands free from 17" high chair.     Status  New    Target Date  08/13/17      PT LONG TERM GOAL #3   Title  After 8 weeks patient will demonstrate Left knee ROM >17-115 degrees.     Status  New    Target Date  08/13/17         Plan - 07/09/17 0935    Clinical Impression Statement  Patient continues to progress well in therapy and session focused on ROM and strengthening exercises for Lt LE. Initiated stationary bike for AROM to improve mobility, patient had positive response with improved mobility and decreased stiffness after 3 minutes. Quad strengthening was progressed for Bil LE with functional lunge and patient reported weakness in right thigh as well as left; therefore LAQ performed for Bil LE this session for isolated strengthening. AROM improved today from 15-103 degrees compared to last session being 15-104. She will continue to benefit from skilled PT services to address ROM and mobility deficits and progress towards goals.    Rehab Potential  Fair    Clinical Impairments Affecting Rehab Potential  chronicity of problem, low prior level of activity     PT  Frequency  2x / week    PT Duration  8 weeks    PT Treatment/Interventions  Electrical Stimulation;Cryotherapy;Moist  Heat;Stair training;Functional mobility training;Therapeutic activities;Therapeutic exercise;Patient/family education;Balance training;Manual techniques;Passive range of motion;Visual/perceptual remediation/compensation    PT Next Visit Plan  Continue with bike for AROM and with contract relax for quad and hamstrings as well. Progress ROM exercises. Perform manual PRN for joint limitations, soft tissue restrictions, and pain. Add contract relax to HEP.    PT Home Exercise Plan  Eval: bridge, supine/seated heel slide, supine/seated LAQ; 06/16/17 - seated/standing heel raises; 06/19/17: HS stretch and toe raises.  07/07/17 - HS stretch on step, quad stretch on step; 07/09/17 - prolong knee extesion stretch    Consulted and Agree with Plan of Care  Patient       Patient will benefit from skilled therapeutic intervention in order to improve the following deficits and impairments:  Abnormal gait, Decreased balance, Decreased mobility, Difficulty walking, Increased muscle spasms, Decreased range of motion, Increased edema, Decreased knowledge of precautions, Decreased activity tolerance, Decreased strength, Impaired flexibility, Pain, Obesity  Visit Diagnosis: Acute pain of left knee  Stiffness of left knee, not elsewhere classified  Difficulty in walking, not elsewhere classified     Problem List Patient Active Problem List   Diagnosis Date Noted  . Mixed hyperlipidemia 02/27/2017  . Vitamin D deficiency 02/27/2017  . Hereditary hemochromatosis ([place]) 02/27/2017  . Atrial fibrillation ([place]) 02/27/2017    Kipp Brood, PT, DPT Physical Therapist with [facility]  07/09/2017 9:50 AM    [place], [place], [address] Phone: [phone]   Fax:  [phone]  Name: Amanda Mckay MRN:  [id number] Date of Birth: 11-04-1945

## 2017-07-11 ENCOUNTER — Encounter: Payer: Medicare Other | Admitting: Family Medicine

## 2017-07-15 ENCOUNTER — Ambulatory Visit (HOSPITAL_COMMUNITY): Payer: Medicare Other

## 2017-07-15 ENCOUNTER — Encounter (HOSPITAL_COMMUNITY): Payer: Self-pay

## 2017-07-15 ENCOUNTER — Other Ambulatory Visit: Payer: Self-pay

## 2017-07-15 DIAGNOSIS — M25562 Pain in left knee: Secondary | ICD-10-CM | POA: Diagnosis not present

## 2017-07-15 DIAGNOSIS — R262 Difficulty in walking, not elsewhere classified: Secondary | ICD-10-CM

## 2017-07-15 DIAGNOSIS — M25662 Stiffness of left knee, not elsewhere classified: Secondary | ICD-10-CM

## 2017-07-15 NOTE — Therapy (Signed)
[facility] [address] [place], [place], [address] Phone: [phone]   Fax:  [phone]  Physical Therapy Treatment/Progress Note  Patient Details  Name: Amanda Mckay MRN: [id number] Date of Birth: 05-28-1945 Referring Provider: Caren Macadam    Encounter Date: 07/15/2017  Progress Note Reporting Period 06/13/17 to 07/14/17  See note below for Objective Data and Assessment of Progress/Goals.     PT End of Session - 07/15/17 0912    Visit Number  8    Number of Visits  16    Date for PT Re-Evaluation  08/13/17 Minireassess/FOTO: 07/14/17    Authorization Type  [employer] medicare    Authorization Time Period  06/13/17-08/13/17    Authorization - Visit Number  1    Authorization - Number of Visits  10    PT Start Time  0906    PT Stop Time  0945    PT Time Calculation (min)  39 min    Activity Tolerance  Patient tolerated treatment well;No increased pain    Behavior During Therapy  WFL for tasks assessed/performed       Past Medical History:  Diagnosis Date  . Atrial fibrillation ([place])   . Iron excess     Past Surgical History:  Procedure Laterality Date  . CESAREAN SECTION    . TONSILLECTOMY      There were no vitals filed for this visit.  Subjective Assessment - 07/15/17 0909    Subjective  Patient reports it was a busy weekend with her grandson here visiting. She reports she walked a lot more around [employer] center with him and her knee was really sore. She states her knee still feels stiff but is doing better than it was over the weekend.    Pertinent History  Pt reports having some knee pain several years ago, saw an orthopedist who diagnoses knee OA, took PT and DC "fully recovered" about 5-6 years ago. Started having pain again, stiffness, difficulty walking about 3 weeks ago. Pt recently moved here from the [place] area.     How long can you sit comfortably?  Not limited     Patient Stated Goals  Be able to  perform all leisure activity withoutlimitation, enjoy her new home and garden    Currently in Pain?  Yes    Pain Score  2     Pain Location  Knee    Pain Orientation  Left    Pain Descriptors / Indicators  Aching    Pain Type  Chronic pain    Pain Onset  More than a month ago    Pain Frequency  Constant         [facility] PT Assessment - 07/15/17 0001      Assessment   Medical Diagnosis  Left knee Pain/stiffness    Referring Provider  Caren Macadam     Onset Date/Surgical Date  05/23/17    Hand Dominance  Right      Balance Screen   Has the patient fallen in the past 6 months  No      Prior Function   Level of Independence  Independent with basic ADLs;Independent with community mobility with device    Vocation  Retired      Associate Professor   Overall Cognitive Status  Within Functional Limits for tasks assessed      Observation/Other Assessments   Focus on Therapeutic Outcomes (FOTO)   -- was 50% impaired on 06/13/17  PROM   Left Knee Extension  16 was 30*    Left Knee Flexion  102 was 98*      Transfers   Five time sit to stand comments   17.33 seconds, sliding UE on thighs, cues for safety and balance with standing, from standard chair      Ambulation/Gait   Ambulation/Gait  Yes    Ambulation/Gait Assistance  7: Independent    Ambulation Distance (Feet)  572 Feet 3MWT    Assistive device  None    Gait Pattern  Left flexed knee in stance;Decreased hip/knee flexion - right;Decreased hip/knee flexion - left;Decreased stride length;Decreased stance time - left;Antalgic appears antalgic but pt denies pain    Ambulation Surface  Level    Gait velocity  0.96 m/s    Gait Comments  patient denies pain with walking       [facility] Adult PT Treatment/Exercise - 07/15/17 0001      Knee/Hip Exercises: Stretches   Passive Hamstring Stretch  Left;3 reps;30 seconds;Limitations    Passive Hamstring Stretch Limitations  12" box with pressure on anterior thigh proximal to knee    Knee:  Self-Stretch to increase Flexion  Left;3 reps;30 seconds    Knee: Self-Stretch Limitations  knee drives on 43PI step      Knee/Hip Exercises: Aerobic   Stationary Bike  4 minutes full revolutions on seat 14, patient with increased speed and ease of mobility during last minute      Knee/Hip Exercises: Standing   Heel Raises  Limitations;20 reps;Both    Heel Raises Limitations  1 HHA     Forward Lunges  Both;10 reps;Limitations;2 sets    Forward Lunges Limitations  on 6" box, with Bil UE support    Terminal Knee Extension  Left;Strengthening;2 sets;15 reps;Theraband    Theraband Level (Terminal Knee Extension)  Level 4 (Blue)      Knee/Hip Exercises: Supine   Knee Extension  AROM;Limitations    Knee Extension Limitations  16    Knee Flexion  AROM;Limitations    Knee Flexion Limitations  102        PT Education - 07/15/17 0946    Education provided  Yes    Education Details  Educated on exercises throughout session and on progress towards goals.    Person(s) Educated  Patient    Methods  Explanation    Comprehension  Verbalized understanding       PT Short Term Goals - 07/15/17 0914      PT SHORT TERM GOAL #1   Title  After 4 weeks patient will demonstrate improved activity tolerance AEB tolerance of 3MWT without increased pain, averaging gait speed >0.2ms.     Baseline  07/15/17 - 0.96 m/s    Status  Achieved      PT SHORT TERM GOAL #2   Title  After 4 weeks patient will demonstrate improved functional strength AEB 5xSTS in <18sec hands free from 17" high chair.     Baseline  07/15/17 - 17.33 seconds    Status  Achieved      PT SHORT TERM GOAL #3   Title  After 4 weeks patient will demonstrate Left knee ROM >25-110 degrees    Baseline  07/15/17 - 16-102 degrees    Status  On-going        PT Long Term Goals - 07/15/17 09518     PT LONG TERM GOAL #1   Title  After 8 weeks patient will demonstrate improved activity tolerance  AEB tolerance of 6MWT without increased  pain, averaging gait speed >1.34ms.     Baseline  07/15/17 - 0.96 m/s with 3MWT    Status  On-going      PT LONG TERM GOAL #2   Title  After 8 weeks patient will demonstrate improved functional strength AEB 5xSTS in <15sec hands free from 17" high chair.     Baseline  07/15/17 - 17.33 seconds    Status  On-going      PT LONG TERM GOAL #3   Title  After 8 weeks patient will demonstrate Left knee ROM >17-115 degrees.     Baseline  07/15/17 - 16-102 degrees    Status  On-going        Plan - 07/15/17 0912    Clinical Impression Statement  Re-assessment performed today and patient has met 2/3 short term goals and is progressing well towards long-term goals. Her greatest limitation remains limited flexion and extension of Lt knee, however she has made more improvements into extension. AROM for Lt knee was 16-102 degrees compared to 30-98 degrees at evaluation. She also has improved her 5x sit to stand test and was able to perform test with no UE use and from standard height chair as well as improved 3MWT gait velocity to 0.9 m/s. She will continue to benefit from skilled PT services to address ROM and mobility deficits and progress towards goals.    Rehab Potential  Fair    Clinical Impairments Affecting Rehab Potential  chronicity of problem, low prior level of activity     PT Frequency  2x / week    PT Duration  8 weeks    PT Treatment/Interventions  Electrical Stimulation;Cryotherapy;Moist Heat;Stair training;Functional mobility training;Therapeutic activities;Therapeutic exercise;Patient/family education;Balance training;Manual techniques;Passive range of motion;Visual/perceptual remediation/compensation    PT Next Visit Plan  FOTO this session. Continue with bike for AROM and with contract relax for quad and hamstrings as well. Progress ROM exercises. Perform manual PRN for joint limitations, soft tissue restrictions, and pain. Perform Rt knee flexion/lunge with mobilization. Add contract relax  to HEP.    PT Home Exercise Plan  Eval: bridge, supine/seated heel slide, supine/seated LAQ; 06/16/17 - seated/standing heel raises; 06/19/17: HS stretch and toe raises.  07/07/17 - HS stretch on step, quad stretch on step; 07/09/17 - prolong knee extesion stretch    Consulted and Agree with Plan of Care  Patient       Patient will benefit from skilled therapeutic intervention in order to improve the following deficits and impairments:  Abnormal gait, Decreased balance, Decreased mobility, Difficulty walking, Increased muscle spasms, Decreased range of motion, Increased edema, Decreased knowledge of precautions, Decreased activity tolerance, Decreased strength, Impaired flexibility, Pain, Obesity  Visit Diagnosis: Acute pain of left knee  Stiffness of left knee, not elsewhere classified  Difficulty in walking, not elsewhere classified     Problem List Patient Active Problem List   Diagnosis Date Noted  . Mixed hyperlipidemia 02/27/2017  . Vitamin D deficiency 02/27/2017  . [place]itary [place] ([place] 02/27/2017  . Atrial fibrillation ([place] 02/27/2017    RKipp Brood PT, DPT Physical Therapist [facility] 07/15/2017 11:04 AM    [facility] [place] [address]Phone: [phone]  Fax:  [phone] Name: Amanda BrickMRN: [id number] of Birth: [birth year]

## 2017-07-16 ENCOUNTER — Encounter: Payer: Self-pay | Admitting: Family Medicine

## 2017-07-16 ENCOUNTER — Ambulatory Visit (INDEPENDENT_AMBULATORY_CARE_PROVIDER_SITE_OTHER): Payer: Medicare Other | Admitting: Family Medicine

## 2017-07-16 ENCOUNTER — Other Ambulatory Visit (HOSPITAL_COMMUNITY)
Admission: RE | Admit: 2017-07-16 | Discharge: 2017-07-16 | Disposition: A | Discharge status: Home / Self Care | Payer: Medicare Other | Source: Ambulatory Visit | Attending: Family Medicine | Admitting: Family Medicine

## 2017-07-16 ENCOUNTER — Other Ambulatory Visit: Payer: Self-pay

## 2017-07-16 VITALS — BP 142/80 | HR 84 | Temp 98.2°F | Resp 16 | Ht 68.0 in | Wt 235.0 lb

## 2017-07-16 DIAGNOSIS — Z79899 Other long term (current) drug therapy: Secondary | ICD-10-CM | POA: Diagnosis not present

## 2017-07-16 DIAGNOSIS — H9209 Otalgia, unspecified ear: Secondary | ICD-10-CM

## 2017-07-16 DIAGNOSIS — Z124 Encounter for screening for malignant neoplasm of cervix: Secondary | ICD-10-CM | POA: Diagnosis not present

## 2017-07-16 DIAGNOSIS — Z0000 Encounter for general adult medical examination without abnormal findings: Secondary | ICD-10-CM

## 2017-07-16 DIAGNOSIS — L918 Other hypertrophic disorders of the skin: Secondary | ICD-10-CM

## 2017-07-16 DIAGNOSIS — I480 Paroxysmal atrial fibrillation: Secondary | ICD-10-CM

## 2017-07-16 DIAGNOSIS — E782 Mixed hyperlipidemia: Secondary | ICD-10-CM | POA: Diagnosis not present

## 2017-07-16 DIAGNOSIS — K439 Ventral hernia without obstruction or gangrene: Secondary | ICD-10-CM | POA: Diagnosis not present

## 2017-07-16 NOTE — Progress Notes (Signed)
Patient ID: Tulsi Crossett, female    DOB: [birth year], 72 y.o.   MRN: [id number]  Chief Complaint  Patient presents with  . Annual Exam    Allergies Tetanus toxoids  Subjective:   Ligaya Cormier is a 72 y.o. female who presents to [facility] today.  HPI Here for CPE.  Reports that she is doing well.  Has lost 6 pounds since her last visit.  Has not had any problems with her atrial fibrillation.  Has followed up with her cardiology.  Is taking her flecainide and beta-blocker each day as directed.  Denies any chest pain, palpitations, or shortness of breath.  Is currently undergoing physical therapy for her knee pain.  Believes that it is helping and is getting increased range of motion and less pain.  Reports that she has felt some fullness in her right ear and is not hearing as well out of her ear.  Denies any tinnitus, drainage, or history of problems with her ear.  She has not had a Pap smear in many years.  Is not exactly sure when her last Pap smear or pelvic exam was.  She denies any history of abnormal smears in the past but is not sure when she had a Pap smear performed.  Has recently completed her Cologuard testing.  Reports that her bowel movements are normal.  No melena or bright red blood per rectum.  Does have a skin tag at her right side of her neck that she would like to have removed.  Has been seen by dermatology and is not had any skin problems since the visit.  Has been checked for skin cancer.  Did have an area removed on her hand which was precancerous.  Is not interested in starting cholesterol medication at this time but would like to have her labs rechecked.  She does report she has a skin tag that she would like to have removed on her neck.  She reports that it itches and gets caught on the color of her shirt frequently.   Past Medical History:  Diagnosis Date  . Atrial fibrillation ([place])   . Iron excess     Past Surgical History:  Procedure Laterality Date    . CESAREAN SECTION    . TONSILLECTOMY      Family History  Problem Relation Age of Onset  . Stroke Mother   . Atrial fibrillation Son   . Stroke Maternal Grandmother   . Diabetes Paternal Grandfather   . Arthritis Brother      Social History   Socioeconomic History  . Marital status: Married    Spouse name: Not on file  . Number of children: Not on file  . Years of education: Not on file  . Highest education level: Not on file  Occupational History  . Not on file  Social Needs  . Financial resource strain: Not on file  . Food insecurity:    Worry: Not on file    Inability: Not on file  . Transportation needs:    Medical: Not on file    Non-medical: Not on file  Tobacco Use  . Smoking status: Never Smoker  . Smokeless tobacco: Never Used  Substance and Sexual Activity  . Alcohol use: Yes    Alcohol/week: 1.2 oz    Types: 2 Glasses of wine per week  . Drug use: No  . Sexual activity: Yes  Lifestyle  . Physical activity:    Days per week: Not on  file    Minutes per session: Not on file  . Stress: Not on file  Relationships  . Social connections:    Talks on phone: Not on file    Gets together: Not on file    Attends religious service: Not on file    Active member of club or organization: Not on file    Attends meetings of clubs or organizations: Not on file    Relationship status: Not on file  Other Topics Concern  . Not on file  Social History Narrative   Retired Copywriter, advertising.   Married.   Has children 3. Grandchildren, 2.    Grew up in [place], [place].   Married for over 31 years.    Eats all food groups.   Just moved to [place], [place] in 8/18.    Current Outpatient Medications on File Prior to Visit  Medication Sig Dispense Refill  . aspirin 325 MG tablet Take 325 mg by mouth daily.    . calcium carbonate (CALCIUM 600) 600 MG TABS tablet Take 600 mg by mouth 2 (two) times daily with a meal.    . Cholecalciferol (D3-1000) 1000 units capsule Take  1,000 Units by mouth daily.    . famotidine (PEPCID) 40 MG tablet Take 40 mg by mouth as needed for heartburn or indigestion.    . flecainide (TAMBOCOR) 100 MG tablet Take 150 mg by mouth 2 (two) times daily.    . metoprolol tartrate (LOPRESSOR) 25 MG tablet Take 25 mg by mouth 2 (two) times daily.     No current facility-administered medications on file prior to visit.     Review of Systems  Constitutional: Negative for activity change, appetite change, fatigue, fever and unexpected weight change.  HENT: Negative for congestion, ear discharge, ear pain, facial swelling, nosebleeds, postnasal drip, rhinorrhea, sinus pressure, sneezing, sore throat, trouble swallowing and voice change.   Eyes: Negative for visual disturbance.  Respiratory: Negative for cough, chest tightness, shortness of breath and wheezing.   Cardiovascular: Negative for chest pain, palpitations and leg swelling.  Gastrointestinal: Negative for abdominal distention, abdominal pain, blood in stool, constipation, diarrhea, nausea and vomiting.  Genitourinary: Negative for difficulty urinating, dyspareunia, dysuria, flank pain, frequency, hematuria, urgency, vaginal bleeding, vaginal discharge and vaginal pain.  Musculoskeletal: Negative for back pain, joint swelling, myalgias, neck pain and neck stiffness.  Skin: Negative for color change, pallor, rash and wound.  Neurological: Negative for dizziness, tremors, seizures, syncope, facial asymmetry, speech difficulty, weakness, light-headedness, numbness and headaches.  Hematological: Negative for adenopathy. Does not bruise/bleed easily.  Psychiatric/Behavioral: Negative for behavioral problems, dysphoric mood, self-injury, sleep disturbance and suicidal ideas. The patient is not nervous/anxious.      Objective:   BP (!) 142/80 (BP Location: Left Arm, Patient Position: Sitting, Cuff Size: Normal)   Pulse 84   Temp 98.2 F (36.8 C) (Temporal)   Resp 16   Ht 5\' 8"  (1.727 m)    Wt 235 lb (106.6 kg)   SpO2 96%   BMI 35.73 kg/m   Physical Exam  Constitutional: She is oriented to person, place, and time. She appears well-developed and well-nourished. No distress.  HENT:  Head: Normocephalic and atraumatic.  Right Ear: Decreased hearing is noted.  Left Ear: Tympanic membrane, external ear and ear canal normal. No decreased hearing is noted.  Nose: Nose normal.  Mouth/Throat: Oropharynx is clear and moist. No oropharyngeal exudate.  Right-sided ear canal obstructed with cerumen.  Cerumen curetted and large amount removed.  However,  persistent occlusion of posterior canal with cerumen obstructing view of tympanic membrane.  Cerumen too far in canal to safely remove with curette.  Cerumen would not budge with lavage.  Eyes: Pupils are equal, round, and reactive to light. Conjunctivae and EOM are normal. No scleral icterus.  Neck: Normal range of motion. Neck supple. No JVD present. No tracheal deviation present. No thyromegaly present.  Cardiovascular: Normal rate, regular rhythm and normal heart sounds.  Pulmonary/Chest: Effort normal and breath sounds normal. No respiratory distress. Right breast exhibits no inverted nipple, no mass, no nipple discharge, no skin change and no tenderness. Left breast exhibits no inverted nipple, no mass, no nipple discharge, no skin change and no tenderness.  Abdominal: Soft. Bowel sounds are normal. A hernia is present.  12x14 cm midline hernia, very large; not reducible, NTTP.  bowel sounds ausculated over the hernia. Lesion/Hernia with evidence of skin thinning and telangectasias present.   Genitourinary: Vagina normal and uterus normal. There is no rash, tenderness, lesion or injury on the right labia. There is no rash, tenderness, lesion or injury on the left labia. Cervix exhibits no motion tenderness, no discharge and no friability. Right adnexum displays no mass, no tenderness and no fullness. Left adnexum displays no mass, no  tenderness and no fullness. No erythema, tenderness or bleeding in the vagina. No foreign body in the vagina. No signs of injury around the vagina. No vaginal discharge found.  Genitourinary Comments: Bimanual exam limited by body habitus/obesity.  Lymphadenopathy:    She has no cervical adenopathy.  Neurological: She is alert and oriented to person, place, and time. No cranial nerve deficit.  Skin: Skin is warm and dry.  Left neck with 4 mm pedunculated skin tag.  Area was clean and prepped.  Skin tag was snipped with sterile scissors.  Silver nitrate used to cauterize area.  Patient with no pain to the area.  Verbal consent given.  No complications.  Bacitracin and Band-Aid applied to the area.  Patien tolerated well without any disturbance.  Nursing note and vitals reviewed.  Depression screen [facility] 2/9 05/28/2017 02/27/2017  Decreased Interest 0 0  Down, Depressed, Hopeless 0 0  PHQ - 2 Score 0 0    Assessment and Plan  1. Well adult health check Age-appropriate anticipatory guidance was given and discussed with patient. Diet, exercise, and weight loss modifications recommended.  She was congratulated on her weight loss. Dentist visits recommended every 6 months. Patient exam recommended yearly. Patient defers bone density testing. Cologuard testing performed in normal.   2. Screening for cervical cancer Pap, pelvic, clinical breast exam performed. - Cytology - PAP  3. Hernia of abdominal wall Patient with large lesion, suspect a hernia but somewhat abnormal appearance.  Refer to surgery for evaluation. - Ambulatory referral to General Surgery  4. Skin tag Counseled regarding benign nature of lesion.  Skin tag was removed as requested by patient today. Call with questions or concerns.  Keep regularly scheduled follow-up with dermatologist.  Patient defers pathology of tach to be sent.  Lesion pathognomonic for skin tag. 5. Mixed hyperlipidemia She defers cholesterol medication  at this time.  We did discuss that statins decrease risk of cardiovascular complications from elevated cholesterol.  Her ASCVD cardiac risk is indicative of starting statin therapy.  She would like to initiate/continue diet and total lifestyle modifications at this time to improve her cholesterol.  She reports she will return to clinic in 2 to 3 months to have this checked and  will consider therapy if it is not to goal.  We discussed that her LDL goal is less than 100. - Lipid panel - Hepatic function panel  6. Hereditary hemochromatosis ([place]) Monitor q. 6 months. - CBC with Differential/Platelet - Iron, TIBC and Ferritin Panel  7. Paroxysmal atrial fibrillation ([place]) Keep scheduled visit with cardiology and medications per cardiology.   Immunization records requested today. Return in about 3 months (around 10/16/2017) for follow up. Caren Macadam, MD 07/16/2017

## 2017-07-16 NOTE — Patient Instructions (Addendum)
Request immunization records Follow up in 3-4 months-recheck cholesterol, come to the lab 2 days prior to your visit and get your fasting blood work done so that we can discuss at your visit.  When you get your labs in 3-4 months, I will recheck sugar. And I will get your cholesterol and liver.  Referral done to Dr. Armstead Peaks for evaluation of hernia;  Vision yearly. Dentist every six months.   Good job on weight loss!!!!!

## 2017-07-17 ENCOUNTER — Encounter (HOSPITAL_COMMUNITY): Payer: Self-pay

## 2017-07-17 ENCOUNTER — Ambulatory Visit (HOSPITAL_COMMUNITY): Payer: Medicare Other | Attending: Family Medicine

## 2017-07-17 DIAGNOSIS — M25662 Stiffness of left knee, not elsewhere classified: Secondary | ICD-10-CM | POA: Diagnosis present

## 2017-07-17 DIAGNOSIS — M25562 Pain in left knee: Secondary | ICD-10-CM | POA: Diagnosis present

## 2017-07-17 DIAGNOSIS — R262 Difficulty in walking, not elsewhere classified: Secondary | ICD-10-CM | POA: Insufficient documentation

## 2017-07-17 NOTE — Therapy (Signed)
[place], [place], [address] Phone: [phone]   Fax:  [phone]  Physical Therapy Treatment  Patient Details  Name: Amanda Mckay MRN: [id number] Date of Birth: 10/06/1945 Referring Provider: Caren Macadam   Encounter Date: 07/17/2017  PT End of Session - 07/17/17 0907    Visit Number  9    Number of Visits  16    Date for PT Re-Evaluation  08/13/17 Mini-reassess 07/15/17    Authorization Type  [employer] medicare    Authorization Time Period  06/13/17-08/13/17    Authorization - Visit Number  2    Authorization - Number of Visits  10    PT Start Time  [phone] 4' on bike, not included in billing    PT Stop Time  0947    PT Time Calculation (min)  44 min    Activity Tolerance  Patient tolerated treatment well;No increased pain    Behavior During Therapy  WFL for tasks assessed/performed       Past Medical History:  Diagnosis Date  . Atrial fibrillation ([place])   . Iron excess     Past Surgical History:  Procedure Laterality Date  . CESAREAN SECTION    . TONSILLECTOMY      There were no vitals filed for this visit.  Subjective Assessment - 07/17/17 0904    Subjective  Pt stated she has been watching grandson over weekend, did include her HEP over busy weekend.  No reports of pain currently.      Pertinent History  Pt reports having some knee pain several years ago, saw an orthopedist who diagnoses knee OA, took PT and DC "fully recovered" about 5-6 years ago. Started having pain again, stiffness, difficulty walking about 3 weeks ago. Pt recently moved here from the [place] area.     Patient Stated Goals  Be able to perform all leisure activity withoutlimitation, enjoy her new home and garden    Currently in Pain?  No/denies         [facility] PT Assessment - 07/17/17 0001      Assessment   Medical Diagnosis  Left knee Pain/stiffness    Referring Provider  Caren Macadam    Onset Date/Surgical Date  05/23/17    Hand Dominance  Right      Observation/Other Assessments   Focus on Therapeutic Outcomes (FOTO)   FOTO: 48% (52% limitation)                   [facility] Adult PT Treatment/Exercise - 07/17/17 0001      Knee/Hip Exercises: Stretches   Passive Hamstring Stretch  Left;3 reps;30 seconds;Limitations    Passive Hamstring Stretch Limitations  12" box with pressure on anterior thigh proximal to knee    Knee: Self-Stretch to increase Flexion  Left;3 reps;30 seconds    Knee: Self-Stretch Limitations  knee drives on 63JS step    Gastroc Stretch  Both;3 reps;30 seconds slant board      Knee/Hip Exercises: Aerobic   Stationary Bike  4 minutes full revolutions on seat 14, patient with increased speed and ease of mobility during last minute      Knee/Hip Exercises: Standing   Heel Raises  Limitations;20 reps;Both    Heel Raises Limitations  Toe raises on slope    Terminal Knee Extension  Left;15 reps manual resistance    Theraband Level (Terminal Knee Extension)  Level 4 (Blue)      Knee/Hip Exercises: Seated   Other  Seated Knee/Hip Exercises  Contract/relax for flexion 5x      Knee/Hip Exercises: Supine   Knee Extension  AROM;Limitations    Knee Extension Limitations  14    Knee Flexion  AROM;Limitations    Knee Flexion Limitations  105    Other Supine Knee/Hip Exercises  prolonged knee extension stretch (3 minutes) 7.5 lbs on anterior Lt knee to facilitate stretch with garvity, ankle elevated on bolster.      Knee/Hip Exercises: Prone   Prone Knee Hang  3 minutes with manual to hamstrings      Manual Therapy   Manual Therapy  Joint mobilization    Manual therapy comments  Manual complete separate than rest of tx    Joint Mobilization  3x 30-45 second oscillations grade III/IV to Tibiofemoral joint in AP/PA direction for flexion and extension on Lt knee               PT Short Term Goals - 07/15/17 0914      PT SHORT TERM GOAL #1   Title  After 4 weeks patient will  demonstrate improved activity tolerance AEB tolerance of 3MWT without increased pain, averaging gait speed >0.10m/s.     Baseline  07/15/17 - 0.96 m/s    Status  Achieved      PT SHORT TERM GOAL #2   Title  After 4 weeks patient will demonstrate improved functional strength AEB 5xSTS in <18sec hands free from 17" high chair.     Baseline  07/15/17 - 17.33 seconds    Status  Achieved      PT SHORT TERM GOAL #3   Title  After 4 weeks patient will demonstrate Left knee ROM >25-110 degrees    Baseline  07/15/17 - 16-102 degrees    Status  On-going        PT Long Term Goals - 07/15/17 7425      PT LONG TERM GOAL #1   Title  After 8 weeks patient will demonstrate improved activity tolerance AEB tolerance of 6MWT without increased pain, averaging gait speed >1.20m/s.     Baseline  07/15/17 - 0.96 m/s with 3MWT    Status  On-going      PT LONG TERM GOAL #2   Title  After 8 weeks patient will demonstrate improved functional strength AEB 5xSTS in <15sec hands free from 17" high chair.     Baseline  07/15/17 - 17.33 seconds    Status  On-going      PT LONG TERM GOAL #3   Title  After 8 weeks patient will demonstrate Left knee ROM >17-115 degrees.     Baseline  07/15/17 - 16-102 degrees    Status  On-going            Plan - 07/17/17 1001    Clinical Impression Statement  Session focus with knee mobility.  Continued prolonged stretched and contract/relax technqiues for flexion.  Added prone knee hang to improve extension with manual to hamstrings to address soft tissue restrictions.  Pt tolerated well to tx with no reports of increased pain through session.  Noted pitting edema, pt may benefit from compression hose to address.  AROM 14-104 degrees.      Rehab Potential  Fair    Clinical Impairments Affecting Rehab Potential  chronicity of problem, low prior level of activity     PT Frequency  2x / week    PT Duration  8 weeks    PT Treatment/Interventions  Electrical  Stimulation;Cryotherapy;Moist  Heat;Stair training;Functional mobility training;Therapeutic activities;Therapeutic exercise;Patient/family education;Balance training;Manual techniques;Passive range of motion;Visual/perceptual remediation/compensation    PT Next Visit Plan  Pt may benefit from compression hose, educate next session and send referral if needed.  Continue with bike for AROM and with contract relax for quad and hamstrings as well. Progress ROM exercises. Perform manual PRN for joint limitations, soft tissue restrictions, and pain. Perform Rt knee flexion/lunge with mobilization. Add contract relax to HEP.    PT Home Exercise Plan  Eval: bridge, supine/seated heel slide, supine/seated LAQ; 06/16/17 - seated/standing heel raises; 06/19/17: HS stretch and toe raises.  07/07/17 - HS stretch on step, quad stretch on step; 07/09/17 - prolong knee extesion stretch       Patient will benefit from skilled therapeutic intervention in order to improve the following deficits and impairments:  Abnormal gait, Decreased balance, Decreased mobility, Difficulty walking, Increased muscle spasms, Decreased range of motion, Increased edema, Decreased knowledge of precautions, Decreased activity tolerance, Decreased strength, Impaired flexibility, Pain, Obesity  Visit Diagnosis: Acute pain of left knee  Stiffness of left knee, not elsewhere classified  Difficulty in walking, not elsewhere classified     Problem List Patient Active Problem List   Diagnosis Date Noted  . Mixed hyperlipidemia 02/27/2017  . Vitamin D deficiency 02/27/2017  . Hereditary hemochromatosis ([place]) 02/27/2017  . Atrial fibrillation [facility]) 02/27/2017   Ihor Austin, [place]; [place]  Aldona Lento 07/17/2017, 10:09 AM  [place], [place], [address] Phone: [phone]   Fax:  [phone]  Name: Amanda Mckay MRN: [id number] Date of Birth:  02/24/1946

## 2017-07-18 ENCOUNTER — Encounter: Payer: Self-pay | Admitting: Family Medicine

## 2017-07-18 LAB — CYTOLOGY - PAP: DIAGNOSIS: NEGATIVE

## 2017-07-21 ENCOUNTER — Other Ambulatory Visit: Payer: Self-pay

## 2017-07-21 ENCOUNTER — Encounter (HOSPITAL_COMMUNITY): Payer: Self-pay

## 2017-07-21 ENCOUNTER — Ambulatory Visit (HOSPITAL_COMMUNITY): Payer: Medicare Other

## 2017-07-21 DIAGNOSIS — M25562 Pain in left knee: Secondary | ICD-10-CM

## 2017-07-21 DIAGNOSIS — R262 Difficulty in walking, not elsewhere classified: Secondary | ICD-10-CM

## 2017-07-21 DIAGNOSIS — M25662 Stiffness of left knee, not elsewhere classified: Secondary | ICD-10-CM

## 2017-07-21 NOTE — Therapy (Signed)
[place], [place], [address] Phone: [phone]   Fax:  [phone]  Physical Therapy Treatment  Patient Details  Name: Amanda Mckay MRN: [id number] Date of Birth: 1945-06-15 Referring Provider: Caren Macadam   Encounter Date: 07/21/2017  PT End of Session - 07/21/17 0859    Visit Number  10    Number of Visits  16    Date for PT Re-Evaluation  08/13/17 Mini-reassess 07/15/17    Authorization Type  [employer] medicare    Authorization Time Period  06/13/17-08/13/17    Authorization - Visit Number  3    Authorization - Number of Visits  10    PT Start Time  0901    PT Stop Time  0946    PT Time Calculation (min)  45 min    Activity Tolerance  Patient tolerated treatment well;No increased pain    Behavior During Therapy  WFL for tasks assessed/performed       Past Medical History:  Diagnosis Date  . Atrial fibrillation ([place])   . Iron excess     Past Surgical History:  Procedure Laterality Date  . CESAREAN SECTION    . TONSILLECTOMY      There were no vitals filed for this visit.  Subjective Assessment - 07/21/17 0904    Subjective  Patient reports her knee was bothering her this past weekend and she reports it feels like it might have been aggravated by the bad weather. She also reports she was walking a lot at a garden center and she worked in her gardens too. She did perform her HEP this weekend as well.    Pertinent History  Pt reports having some knee pain several years ago, saw an orthopedist who diagnoses knee OA, took PT and DC "fully recovered" about 5-6 years ago. Started having pain again, stiffness, difficulty walking about 3 weeks ago. Pt recently moved here from the [place] area.     Patient Stated Goals  Be able to perform all leisure activity withoutlimitation, enjoy her new home and garden    Currently in Pain?  No/denies        [facility] Adult PT Treatment/Exercise - 07/21/17 0001      Knee/Hip  Exercises: Stretches   Passive Hamstring Stretch  Left;3 reps;30 seconds;Limitations    Passive Hamstring Stretch Limitations  12" box with pressure on anterior thigh proximal to knee    Knee: Self-Stretch to increase Flexion  Left;3 reps;30 seconds    Knee: Self-Stretch Limitations  knee drives on 26RS step    Gastroc Stretch  Both;3 reps;30 seconds slant board      Knee/Hip Exercises: Aerobic   Stationary Bike  4 minutes full revolutions on seat 14, verbal cues to prevent hip abduction/ER to improve knee flexion stretch      Knee/Hip Exercises: Standing   Terminal Knee Extension  Left;15 reps    Theraband Level (Terminal Knee Extension)  Level 4 (Blue)    Rocker Board  2 minutes lateral      Knee/Hip Exercises: Supine   Other Supine Knee/Hip Exercises  Hamstrig stretch: 3x 5-8 second holds at end range, 30 second relax with passive stretch from therapist at end range (increasing ROM each repetition)      Knee/Hip Exercises: Prone   Contract/Relax to Increase Flexion  3x 5-8 second holds at end range, 30 second relax with passive stretch from therapist at end range for flexion/quad stretch (increasing ROM each repetition)  Prone Knee Hang  Weights;Limitations    Prone Knee Hang Weights (lbs)  3    Prone Knee Hang Limitations  8 minutes with soft tissue mobilzation performed during to hamstrings      Manual Therapy   Manual Therapy  Joint mobilization;Soft tissue mobilization    Manual therapy comments  Manual complete separate than rest of tx    Joint Mobilization  3x 30-45 second oscillations grade III/IV to Tibiofemoral joint in AP/PA direction for flexion and extension on Lt knee        PT Education - 07/21/17 0859    Education provided  Yes    Education Details  educated on exercises throughout session and discussed benefits of compression garment to prevent LE swelling. Patient reports she would prefer to pick garment up at [facility] and I informed her I would send  a referral for her MD to sign.     Person(s) Educated  Patient    Methods  Explanation    Comprehension  Verbalized understanding       PT Short Term Goals - 07/15/17 0914      PT SHORT TERM GOAL #1   Title  After 4 weeks patient will demonstrate improved activity tolerance AEB tolerance of 3MWT without increased pain, averaging gait speed >0.52m/s.     Baseline  07/15/17 - 0.96 m/s    Status  Achieved      PT SHORT TERM GOAL #2   Title  After 4 weeks patient will demonstrate improved functional strength AEB 5xSTS in <18sec hands free from 17" high chair.     Baseline  07/15/17 - 17.33 seconds    Status  Achieved      PT SHORT TERM GOAL #3   Title  After 4 weeks patient will demonstrate Left knee ROM >25-110 degrees    Baseline  07/15/17 - 16-102 degrees    Status  On-going        PT Long Term Goals - 07/15/17 1660      PT LONG TERM GOAL #1   Title  After 8 weeks patient will demonstrate improved activity tolerance AEB tolerance of 6MWT without increased pain, averaging gait speed >1.73m/s.     Baseline  07/15/17 - 0.96 m/s with 3MWT    Status  On-going      PT LONG TERM GOAL #2   Title  After 8 weeks patient will demonstrate improved functional strength AEB 5xSTS in <15sec hands free from 17" high chair.     Baseline  07/15/17 - 17.33 seconds    Status  On-going      PT LONG TERM GOAL #3   Title  After 8 weeks patient will demonstrate Left knee ROM >17-115 degrees.     Baseline  07/15/17 - 16-102 degrees    Status  On-going         Plan - 07/21/17 0859    Clinical Impression Statement  Therapy continues to focus with left knee mobility/ROM. Soft tissue mobilization was performed to bilateral hamstring tendons on Lt knee with prone knee hang extension stretch. Contract relax stretch was also continued for knee flexion and extension and patient reported decrease in posterior knee soreness following hamstring stretch. Rockerboard was added this session for knee mobility and  balance and patient had difficulty preventing early heel rise indicating gastroc/soleus complex muscle length restrictions. She will continue to benefit from skilled PT services to address ROM and mobility deficits and progress towards goals.    Rehab Potential  Fair    Clinical Impairments Affecting Rehab Potential  chronicity of problem, low prior level of activity     PT Frequency  2x / week    PT Duration  8 weeks    PT Treatment/Interventions  Electrical Stimulation;Cryotherapy;Moist Heat;Stair training;Functional mobility training;Therapeutic activities;Therapeutic exercise;Patient/family education;Balance training;Manual techniques;Passive range of motion;Visual/perceptual remediation/compensation    PT Next Visit Plan  Check if referral from MD signed for compression garment. If it is measure thigh high garment for Lt LE. Continue with contract relax for quad and hamstrings and soft tissue work with prone knee stretch for hamstrings and gastroc. Progress ROM exercises. Perform manual PRN for joint limitations, soft tissue restrictions, and pain. Perform Rt knee flexion/lunge with mobilization. Add contract relax to HEP.    PT Home Exercise Plan  Eval: bridge, supine/seated heel slide, supine/seated LAQ; 06/16/17 - seated/standing heel raises; 06/19/17: HS stretch and toe raises.  07/07/17 - HS stretch on step, quad stretch on step; 07/09/17 - prolong knee extesion stretch    Consulted and Agree with Plan of Care  Patient       Patient will benefit from skilled therapeutic intervention in order to improve the following deficits and impairments:  Abnormal gait, Decreased balance, Decreased mobility, Difficulty walking, Increased muscle spasms, Decreased range of motion, Increased edema, Decreased knowledge of precautions, Decreased activity tolerance, Decreased strength, Impaired flexibility, Pain, Obesity  Visit Diagnosis: Acute pain of left knee  Stiffness of left knee, not elsewhere  classified  Difficulty in walking, not elsewhere classified     Problem List Patient Active Problem List   Diagnosis Date Noted  . Mixed hyperlipidemia 02/27/2017  . Vitamin D deficiency 02/27/2017  . Hereditary hemochromatosis ([place]) 02/27/2017  . Atrial fibrillation ([place]) 02/27/2017    Kipp Brood, PT, DPT Physical Therapist with [facility]  07/21/2017 10:01 AM    [place], [place], [address] Phone: [phone]   Fax:  [phone]  Name: Amanda Mckay MRN: [id number] Date of Birth: Jan 14, 1946

## 2017-07-24 ENCOUNTER — Other Ambulatory Visit: Payer: Self-pay

## 2017-07-24 ENCOUNTER — Ambulatory Visit (HOSPITAL_COMMUNITY): Payer: Medicare Other

## 2017-07-24 ENCOUNTER — Encounter (HOSPITAL_COMMUNITY): Payer: Self-pay

## 2017-07-24 DIAGNOSIS — R262 Difficulty in walking, not elsewhere classified: Secondary | ICD-10-CM

## 2017-07-24 DIAGNOSIS — M25662 Stiffness of left knee, not elsewhere classified: Secondary | ICD-10-CM

## 2017-07-24 DIAGNOSIS — M25562 Pain in left knee: Secondary | ICD-10-CM

## 2017-07-24 NOTE — Therapy (Signed)
[place], [place], [address] Phone: [phone]   Fax:  [phone]  Physical Therapy Treatment  Patient Details  Name: Amanda Mckay MRN: [id number] Date of Birth: 05/15/1945 Referring Provider: Caren Macadam   Encounter Date: 07/24/2017  PT End of Session - 07/24/17 1132    Visit Number  11    Number of Visits  16    Date for PT Re-Evaluation  08/13/17 Mini-reassess 07/15/17    Authorization Type  [employer] medicare    Authorization Time Period  06/13/17-08/13/17    Authorization - Visit Number  4    Authorization - Number of Visits  10    PT Start Time  0903    PT Stop Time  0946    PT Time Calculation (min)  43 min    Activity Tolerance  Patient tolerated treatment well;No increased pain    Behavior During Therapy  WFL for tasks assessed/performed       Past Medical History:  Diagnosis Date  . Atrial fibrillation ([place])   . Iron excess     Past Surgical History:  Procedure Laterality Date  . CESAREAN SECTION    . TONSILLECTOMY      There were no vitals filed for this visit.  Subjective Assessment - 07/24/17 0907    Subjective  Patient reports she had a fall in her yard yesterday while she was gardening. She states she was walking backwards and tripped over mulch and fell back hitting her head, back, and bottom. She states she is a little sore but denies nausea, vomiting, dizziness, double vision, headache and other signs/symptoms of concussion. She states her knee does not hurt today just feels stiff. She does states her bottm is a little sore from the fall.    Pertinent History  Pt reports having some knee pain several years ago, saw an orthopedist who diagnoses knee OA, took PT and DC "fully recovered" about 5-6 years ago. Started having pain again, stiffness, difficulty walking about 3 weeks ago. Pt recently moved here from the [place] area.     Patient Stated Goals  Be able to perform all leisure activity  withoutlimitation, enjoy her new home and garden    Currently in Pain?  Yes    Pain Score  2     Pain Location  Buttocks    Pain Orientation  Posterior    Pain Descriptors / Indicators  Sore;Dull    Pain Type  Acute pain    Pain Onset  Yesterday        [facility] Adult PT Treatment/Exercise - 07/24/17 0001      Knee/Hip Exercises: Stretches   Passive Hamstring Stretch  Left;3 reps;30 seconds;Limitations    Passive Hamstring Stretch Limitations  12" box with pressure on anterior thigh proximal to knee    Knee: Self-Stretch to increase Flexion  Left;3 reps;30 seconds    Knee: Self-Stretch Limitations  knee drives on 03JK step    Gastroc Stretch  Both;3 reps;30 seconds slant board      Knee/Hip Exercises: Aerobic   Stationary Bike  4 minutes full revolutions on seat 13, patient with improved form and no ER at hip to compensate      Knee/Hip Exercises: Standing   Terminal Knee Extension  Left;15 reps 5 seconds holds    Theraband Level (Terminal Knee Extension)  Level 4 (Blue)    Rocker Board  2 minutes;Limitations    Rocker Board Limitations  lateral for AROM  Knee/Hip Exercises: Supine   Knee Extension Limitations  10    Knee Flexion Limitations  103      Knee/Hip Exercises: Prone   Prone Knee Hang Limitations  6 minutes with soft tissue mobilzation performed during to hamstrings      Manual Therapy   Manual Therapy  Joint mobilization;Soft tissue mobilization    Manual therapy comments  Manual complete separate than rest of tx    Joint Mobilization  AP glide to Lt tibiofemoral joint for flexion, grade III, with joint distraction inferiorly    Soft tissue mobilization  parallell gildes and sustained pressure to trigger points in hamstring tendons and gastroc on Lt LE during prone knee heang; restrictions noted in lateral gastroc      Balance Exercises - 07/24/17 1140      Balance Exercises: Standing   Rockerboard  Anterior/posterior;Lateral;Intermittent UE support 1 minutes  each direction, maintain midline balalnce        PT Education - 07/24/17 1131    Education provided  Yes    Education Details  Educated on exercises and manual interventions throughout. Discussed re-assessment planned for next week.     Person(s) Educated  Patient    Methods  Explanation    Comprehension  Verbalized understanding       PT Short Term Goals - 07/15/17 0914      PT SHORT TERM GOAL #1   Title  After 4 weeks patient will demonstrate improved activity tolerance AEB tolerance of 3MWT without increased pain, averaging gait speed >0.23m/s.     Baseline  07/15/17 - 0.96 m/s    Status  Achieved      PT SHORT TERM GOAL #2   Title  After 4 weeks patient will demonstrate improved functional strength AEB 5xSTS in <18sec hands free from 17" high chair.     Baseline  07/15/17 - 17.33 seconds    Status  Achieved      PT SHORT TERM GOAL #3   Title  After 4 weeks patient will demonstrate Left knee ROM >25-110 degrees    Baseline  07/15/17 - 16-102 degrees    Status  On-going        PT Long Term Goals - 07/15/17 6734      PT LONG TERM GOAL #1   Title  After 8 weeks patient will demonstrate improved activity tolerance AEB tolerance of 6MWT without increased pain, averaging gait speed >1.61m/s.     Baseline  07/15/17 - 0.96 m/s with 3MWT    Status  On-going      PT LONG TERM GOAL #2   Title  After 8 weeks patient will demonstrate improved functional strength AEB 5xSTS in <15sec hands free from 17" high chair.     Baseline  07/15/17 - 17.33 seconds    Status  On-going      PT LONG TERM GOAL #3   Title  After 8 weeks patient will demonstrate Left knee ROM >17-115 degrees.     Baseline  07/15/17 - 16-102 degrees    Status  On-going        Plan - 07/24/17 1133    Clinical Impression Statement  Therapy continues to focus with left knee mobility/ROM. Rockerboard was continued this session for knee AROM and was advanced to include balance training for postural righting. Patient  continues to have difficulty preventing early heel rise indicating gastroc/soleus complex muscle length restrictions. Soft tissue mobilization was performed to bilateral hamstring tendons and lateral gastroc on Lt knee  with prone knee hang extension stretch and joint mobilization was performed for flexion with manual distraction. Patient reported reduced stiffness/soreness with knee following manual interventions. She will continue to benefit from skilled PT services to address ROM and mobility deficits and progress towards goals.    Rehab Potential  Fair    Clinical Impairments Affecting Rehab Potential  chronicity of problem, low prior level of activity     PT Frequency  2x / week    PT Duration  8 weeks    PT Treatment/Interventions  Electrical Stimulation;Cryotherapy;Moist Heat;Stair training;Functional mobility training;Therapeutic activities;Therapeutic exercise;Patient/family education;Balance training;Manual techniques;Passive range of motion;Visual/perceptual remediation/compensation    PT Next Visit Plan  Check if referral from MD signed for compression garment. If it is measure thigh high garment for Lt LE. Continue with manual for joint limitations, soft tissue restrictions, and pain; perform manual to lateral gastroc in prone knee hang stretch. Continue contract relax for quad and hamstrings and progress ROM exercises. Perform Rt knee flexion/lunge with mobilization. Add contract relax to HEP.    PT Home Exercise Plan  Eval: bridge, supine/seated heel slide, supine/seated LAQ; 06/16/17 - seated/standing heel raises; 06/19/17: HS stretch and toe raises.  07/07/17 - HS stretch on step, quad stretch on step; 07/09/17 - prolong knee extesion stretch    Consulted and Agree with Plan of Care  Patient       Patient will benefit from skilled therapeutic intervention in order to improve the following deficits and impairments:  Abnormal gait, Decreased balance, Decreased mobility, Difficulty walking,  Increased muscle spasms, Decreased range of motion, Increased edema, Decreased knowledge of precautions, Decreased activity tolerance, Decreased strength, Impaired flexibility, Pain, Obesity  Visit Diagnosis: Acute pain of left knee  Stiffness of left knee, not elsewhere classified  Difficulty in walking, not elsewhere classified     Problem List Patient Active Problem List   Diagnosis Date Noted  . Mixed hyperlipidemia 02/27/2017  . Vitamin D deficiency 02/27/2017  . Hereditary hemochromatosis ([place]) 02/27/2017  . Atrial fibrillation ([place]) 02/27/2017    Kipp Brood, PT, DPT Physical Therapist with [facility]  07/24/2017 11:41 AM    [facility] [place] [address] [place], [place], [address] Phone: [phone]   Fax:  [phone]  Name: Shanan Fitzpatrick MRN: [id number] Date of Birth: April 27, 1945

## 2017-07-28 ENCOUNTER — Encounter (HOSPITAL_COMMUNITY): Payer: Self-pay

## 2017-07-28 ENCOUNTER — Other Ambulatory Visit: Payer: Self-pay

## 2017-07-28 ENCOUNTER — Ambulatory Visit (HOSPITAL_COMMUNITY): Payer: Medicare Other

## 2017-07-28 DIAGNOSIS — M25562 Pain in left knee: Secondary | ICD-10-CM | POA: Diagnosis not present

## 2017-07-28 DIAGNOSIS — R262 Difficulty in walking, not elsewhere classified: Secondary | ICD-10-CM

## 2017-07-28 DIAGNOSIS — M25662 Stiffness of left knee, not elsewhere classified: Secondary | ICD-10-CM

## 2017-07-28 NOTE — Therapy (Signed)
[place], [place], [address] Phone: [phone]   Fax:  [phone]  Physical Therapy Treatment  Patient Details  Name: Amanda Mckay MRN: [id number] Date of Birth: January 12, 1946 Referring Provider: Caren Macadam   Encounter Date: 07/28/2017  PT End of Session - 07/28/17 0901    Visit Number  12    Number of Visits  16    Date for PT Re-Evaluation  08/13/17 Mini-reassess 07/15/17    Authorization Type  [employer] medicare    Authorization Time Period  06/13/17-08/13/17    Authorization - Visit Number  5    Authorization - Number of Visits  10    PT Start Time  0901    PT Stop Time  7062    PT Time Calculation (min)  48 min    Activity Tolerance  Patient tolerated treatment well;No increased pain    Behavior During Therapy  WFL for tasks assessed/performed       Past Medical History:  Diagnosis Date  . Atrial fibrillation ([place])   . Iron excess     Past Surgical History:  Procedure Laterality Date  . CESAREAN SECTION    . TONSILLECTOMY      There were no vitals filed for this visit.  Subjective Assessment - 07/28/17 0904    Subjective  Patient reports she had a good weekend and that her knee didn't bother her. She states her knee feels less swollen although she did get a bug bite on the back of her Rt knee and that caused some swelling on her Rt LE. It is no longer red or swollen and appears to be fading. She denies pain.    Pertinent History  Pt reports having some knee pain several years ago, saw an orthopedist who diagnoses knee OA, took PT and DC "fully recovered" about 5-6 years ago. Started having pain again, stiffness, difficulty walking about 3 weeks ago. Pt recently moved here from the [place] area.     Patient Stated Goals  Be able to perform all leisure activity withoutlimitation, enjoy her new home and garden    Currently in Pain?  No/denies       [facility] Adult PT Treatment/Exercise - 07/28/17 0001       Knee/Hip Exercises: Stretches   Passive Hamstring Stretch  Left;3 reps;30 seconds;Limitations    Passive Hamstring Stretch Limitations  12" box with pressure on anterior thigh proximal to knee    Knee: Self-Stretch to increase Flexion  Left;10 seconds;Limitations    Knee: Self-Stretch Limitations  knee drives on 37SE step; 10 repetitions    Gastroc Stretch  Both;3 reps;30 seconds slant board      Knee/Hip Exercises: Aerobic   Stationary Bike  4 minutes full revolutions on seat 13, patient with improved form and no ER at hip to compensate      Knee/Hip Exercises: Standing   Terminal Knee Extension  Left;15 reps;2 sets    Theraband Level (Terminal Knee Extension)  Level 4 (Blue)    Step Down  Both;1 set;10 reps;Hand Hold: 2;Step Height: 4"    Rocker Board  2 minutes;Limitations    Rocker Board Limitations  lateral for AROM      Knee/Hip Exercises: Supine   Knee Extension Limitations  10    Knee Flexion Limitations  105      Manual Therapy   Manual Therapy  Soft tissue mobilization    Manual therapy comments  Manual complete separate than rest of tx  Joint Mobilization  --    Soft tissue mobilization  parallell gildes and sustained pressure to trigger points in gastroc on Lt LE during prone knee heang; restrictions noted in medial>lateral gastroc       Balance Exercises - 07/28/17 0920      Balance Exercises: Standing   Rockerboard  Anterior/posterior;Lateral;Intermittent UE support 1 minutes each direction, maintain midline balalnce        PT Education - 07/28/17 0905    Education provided  Yes    Education Details  Educated on current POC for more treatments to furhter improve ROM as she has made good progress and to improve balanc. Extensive time spent educating patient on benefit of compression garment if edema is limiting ROM for Lt knee. She would like to see if her swelling imprves more this week and if it does she would like to hold off on compression garment.     Person(s) Educated  Patient    Methods  Explanation    Comprehension  Verbalized understanding       PT Short Term Goals - 07/28/17 0934      PT SHORT TERM GOAL #1   Title  After 4 weeks patient will demonstrate improved activity tolerance AEB tolerance of 3MWT without increased pain, averaging gait speed >0.47m/s.     Baseline  07/15/17 - 0.96 m/s    Status  Achieved      PT SHORT TERM GOAL #2   Title  After 4 weeks patient will demonstrate improved functional strength AEB 5xSTS in <18sec hands free from 17" high chair.     Baseline  07/15/17 - 17.33 seconds    Status  Achieved      PT SHORT TERM GOAL #3   Title  After 4 weeks patient will demonstrate Left knee ROM >25-110 degrees    Baseline  07/15/17 - 16-102 degrees    Status  On-going        PT Long Term Goals - 07/28/17 0934      PT LONG TERM GOAL #1   Title  After 8 weeks patient will demonstrate improved activity tolerance AEB tolerance of 6MWT without increased pain, averaging gait speed >1.27m/s.     Baseline  07/15/17 - 0.96 m/s with 3MWT    Status  On-going      PT LONG TERM GOAL #2   Title  After 8 weeks patient will demonstrate improved functional strength AEB 5xSTS in <15sec hands free from 17" high chair.     Baseline  07/15/17 - 17.33 seconds    Status  On-going      PT LONG TERM GOAL #3   Title  After 8 weeks patient will demonstrate Left knee ROM >17-115 degrees.     Baseline  07/15/17 - 16-102 degrees    Status  On-going        Plan - 07/28/17 0905    Clinical Impression Statement  Therapy continues to focus with left knee mobility/ROM and her AROM for Lt knee at EOS was 10-105 degrees. She has continued to make good progress in therapy in the last 2 weeks and will benefit from ongoing skilled interventions to address myofascial restrictions limiting ROM and improve balance and functional strength. Soft tissue mobilization was performed to gastroc on Lt knee with prone knee hang extension stretch followed  by isolated gastroc stretch. Patient was educated on benefits of compression garment for further improvement in ROM but would like to hold off on obtaining garment at this  time. Re-assess next week.    Rehab Potential  Fair    Clinical Impairments Affecting Rehab Potential  chronicity of problem, low prior level of activity     PT Frequency  2x / week    PT Duration  8 weeks    PT Treatment/Interventions  Electrical Stimulation;Cryotherapy;Moist Heat;Stair training;Functional mobility training;Therapeutic activities;Therapeutic exercise;Patient/family education;Balance training;Manual techniques;Passive range of motion;Visual/perceptual remediation/compensation    PT Next Visit Plan  Perform manual to Lt gastroc in prone knee hang stretch. Continue contract relax for quad and hamstrings and progress ROM exercises. Perform Rt knee flexion/lunge with mobilization. Add contract relax to HEP.    PT Home Exercise Plan  Eval: bridge, supine/seated heel slide, supine/seated LAQ; 06/16/17 - seated/standing heel raises; 06/19/17: HS stretch and toe raises.  07/07/17 - HS stretch on step, quad stretch on step; 07/09/17 - prolong knee extesion stretch    Consulted and Agree with Plan of Care  Patient       Patient will benefit from skilled therapeutic intervention in order to improve the following deficits and impairments:  Abnormal gait, Decreased balance, Decreased mobility, Difficulty walking, Increased muscle spasms, Decreased range of motion, Increased edema, Decreased knowledge of precautions, Decreased activity tolerance, Decreased strength, Impaired flexibility, Pain, Obesity  Visit Diagnosis: Acute pain of left knee  Stiffness of left knee, not elsewhere classified  Difficulty in walking, not elsewhere classified     Problem List Patient Active Problem List   Diagnosis Date Noted  . Mixed hyperlipidemia 02/27/2017  . Vitamin D deficiency 02/27/2017  . Hereditary hemochromatosis ([place]) 02/27/2017   . Atrial fibrillation ([place]) 02/27/2017     Kipp Brood, PT, DPT Physical Therapist with [facility]  07/28/2017 11:10 AM     [place], [place], [address] Phone: [phone]   Fax:  [phone]  Name: Shonice Wrisley MRN: [id number] Date of Birth: August 26, 1945

## 2017-07-31 ENCOUNTER — Ambulatory Visit: Payer: Medicare Other | Admitting: General Surgery

## 2017-07-31 ENCOUNTER — Encounter: Payer: Self-pay | Admitting: General Surgery

## 2017-07-31 ENCOUNTER — Telehealth (HOSPITAL_COMMUNITY): Payer: Self-pay

## 2017-07-31 ENCOUNTER — Encounter (HOSPITAL_COMMUNITY): Payer: Self-pay

## 2017-07-31 ENCOUNTER — Ambulatory Visit (HOSPITAL_COMMUNITY): Payer: Medicare Other

## 2017-07-31 ENCOUNTER — Other Ambulatory Visit: Payer: Self-pay

## 2017-07-31 VITALS — BP 149/74 | HR 84 | Temp 98.4°F | Resp 18 | Wt 237.0 lb

## 2017-07-31 DIAGNOSIS — R262 Difficulty in walking, not elsewhere classified: Secondary | ICD-10-CM

## 2017-07-31 DIAGNOSIS — K439 Ventral hernia without obstruction or gangrene: Secondary | ICD-10-CM | POA: Diagnosis not present

## 2017-07-31 DIAGNOSIS — M25662 Stiffness of left knee, not elsewhere classified: Secondary | ICD-10-CM

## 2017-07-31 DIAGNOSIS — M25562 Pain in left knee: Secondary | ICD-10-CM | POA: Diagnosis not present

## 2017-07-31 NOTE — Therapy (Signed)
[place], [place], [address] Phone: [phone]   Fax:  [phone]  Physical Therapy Treatment  Patient Details  Name: Amanda Mckay MRN: [id number] Date of Birth: [birth year] Referring Provider: Caren Macadam   Encounter Date: 07/31/2017  PT End of Session - 07/31/17 1735    Visit Number  13    Number of Visits  16    Date for PT Re-Evaluation  08/13/17 Mini-reassess 07/15/17    Authorization Type  [employer] medicare    Authorization Time Period  06/13/17-08/13/17    Authorization - Visit Number  6    Authorization - Number of Visits  10    PT Start Time  1632    PT Stop Time  2458    PT Time Calculation (min)  48 min    Activity Tolerance  Patient tolerated treatment well;No increased pain    Behavior During Therapy  WFL for tasks assessed/performed       Past Medical History:  Diagnosis Date  . Atrial fibrillation ([place])   . Iron excess     Past Surgical History:  Procedure Laterality Date  . CESAREAN SECTION    . TONSILLECTOMY      There were no vitals filed for this visit.  Subjective Assessment - 07/31/17 1634    Subjective  Patient reports she feels like she is walking better than it used to be and she is not having as much stiffness or pain today. She reports she is getting surgery to fix her abdominal hernia at the end of this month on 08/15/17.  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    Pertinent History  Pt reports having some knee pain several years ago, saw an orthopedist who diagnoses knee OA, took PT and DC "fully recovered" about 5-6 years ago. Started having pain again, stiffness, difficulty walking about 3 weeks ago. Pt recently moved here from the [place] area.     Patient Stated Goals  Be able to perform all leisure activity withoutlimitation, enjoy her new home and garden    Currently in Pain?  No/denies         [facility] PT Assessment -  07/31/17 0001      Observation/Other Assessments-Edema    Edema  Circumferential      Circumferential Edema   Circumferential - Left   Compression Garment measures: 8.5 shoe, ankle = 9.5", calf = 19", thigh = 23.5 inches        [facility] Adult PT Treatment/Exercise - 07/31/17 0001      Knee/Hip Exercises: Stretches   Passive Hamstring Stretch  Left;3 reps;30 seconds;Limitations    Passive Hamstring Stretch Limitations  12" box with pressure on anterior thigh proximal to knee    Knee: Self-Stretch to increase Flexion  Left;10 seconds;Limitations    Knee: Self-Stretch Limitations  Movement with mobilization: 10 x 10 seconds with AP glide grade Iv to tibiofemoral joint on Lt LE    Gastroc Stretch  Both;3 reps;30 seconds slant board      Knee/Hip Exercises: Standing   Heel Raises  Limitations;20 reps;Both    Terminal Knee Extension  Left;15 reps;2 sets    Theraband Level (Terminal Knee Extension)  Level 4 (Blue)    Rocker Board  2 minutes;Limitations    Rocker Board Limitations  lateral for AROM      Knee/Hip Exercises: Seated   Other Seated Knee/Hip Exercises  Toe raises1x 20 reps  both       Knee/Hip Exercises: Supine   Other Supine Knee/Hip Exercises  Hamstrig stretch: 3x 5-8 second holds at end range, 30 second relax with rope at end range (increasing ROM each repetition)      Knee/Hip Exercises: Prone   Contract/Relax to Increase Flexion  3x 5-8 second holds at end range, 30 second relax with rope at end range for flexion/quad stretch (increasing ROM each repetition)    Prone Knee Hang Limitations  4 minutes with soft tissue mobilzation performed during to gastroc; 3lb weight around ankle      Manual Therapy   Manual Therapy  Soft tissue mobilization    Manual therapy comments  Manual complete separate than rest of tx    Soft tissue mobilization  parallell gildes and sustained pressure to trigger points in gastroc on Lt LE during prone knee heang; restrictions noted in  medial>lateral gastroc       Balance Exercises - 07/31/17 1644      Balance Exercises: Standing   Tandem Stance  Eyes open;Foam/compliant surface;4 reps;15 secs;20 secs alternating foot alignment    Rockerboard  Anterior/posterior;Lateral;Intermittent UE support 1 minutes each direction, maintain midline balalnce       PT Education - 07/31/17 1748    Education provided  Yes    Education Details  Educated on new HEP exercises for ROM and stretching.     Person(s) Educated  Patient    Methods  Explanation;Handout    Comprehension  Verbalized understanding       PT Short Term Goals - 07/28/17 0934      PT SHORT TERM GOAL #1   Title  After 4 weeks patient will demonstrate improved activity tolerance AEB tolerance of 3MWT without increased pain, averaging gait speed >0.55m/s.     Baseline  07/15/17 - 0.96 m/s    Status  Achieved      PT SHORT TERM GOAL #2   Title  After 4 weeks patient will demonstrate improved functional strength AEB 5xSTS in <18sec hands free from 17" high chair.     Baseline  07/15/17 - 17.33 seconds    Status  Achieved      PT SHORT TERM GOAL #3   Title  After 4 weeks patient will demonstrate Left knee ROM >25-110 degrees    Baseline  07/15/17 - 16-102 degrees    Status  On-going        PT Long Term Goals - 07/28/17 0934      PT LONG TERM GOAL #1   Title  After 8 weeks patient will demonstrate improved activity tolerance AEB tolerance of 6MWT without increased pain, averaging gait speed >1.14m/s.     Baseline  07/15/17 - 0.96 m/s with 3MWT    Status  On-going      PT LONG TERM GOAL #2   Title  After 8 weeks patient will demonstrate improved functional strength AEB 5xSTS in <15sec hands free from 17" high chair.     Baseline  07/15/17 - 17.33 seconds    Status  On-going      PT LONG TERM GOAL #3   Title  After 8 weeks patient will demonstrate Left knee ROM >17-115 degrees.     Baseline  07/15/17 - 16-102 degrees    Status  On-going         Plan -  07/31/17 1750    Clinical Impression Statement  Session today focused on ROM and balance training. Tandem stance on foam was introduced  and Amanda Mckay was able to maintain balance for 15-20 seconds. She demonstrated much improved control on rockerboard to maintain midline for both lateral/anterior-posterior weight shift. Forward lunge for knee flexion stretch was progressed with movement with mobilization with therapist applying AP glide to Lt knee to facilitate flexion. Soft tissue mobilization was continued to gastroc on Lt knee with prone knee hang. HEP was updated and patient demonstrated good form with contract relax stretch. Patient was educated on benefits of compression garment provided handout for referral and for resources to purchase garments as well as her measurements. Re-assess next week.    Rehab Potential  Fair    Clinical Impairments Affecting Rehab Potential  chronicity of problem, low prior level of activity     PT Frequency  2x / week    PT Duration  8 weeks    PT Treatment/Interventions  Electrical Stimulation;Cryotherapy;Moist Heat;Stair training;Functional mobility training;Therapeutic activities;Therapeutic exercise;Patient/family education;Balance training;Manual techniques;Passive range of motion;Visual/perceptual remediation/compensation    PT Next Visit Plan  Perform manual to Lt gastroc in prone knee hang stretch. Continue contract relax for quad and hamstrings and progress ROM exercises. Continue Rt knee flexion/lunge with mobilization. Follow up on compression garment.    PT Home Exercise Plan  Eval: bridge, supine/seated heel slide, supine/seated LAQ; 06/16/17 - seated/standing heel raises; 06/19/17: HS stretch and toe raises.  07/07/17 - HS stretch on step, quad stretch on step; 07/09/17 - prolong knee extesion stretch; 07/31/17 - contract/relax for hamstring/quad    Consulted and Agree with Plan of Care  Patient       Patient will benefit from skilled therapeutic intervention in  order to improve the following deficits and impairments:  Abnormal gait, Decreased balance, Decreased mobility, Difficulty walking, Increased muscle spasms, Decreased range of motion, Increased edema, Decreased knowledge of precautions, Decreased activity tolerance, Decreased strength, Impaired flexibility, Pain, Obesity  Visit Diagnosis: Acute pain of left knee  Stiffness of left knee, not elsewhere classified  Difficulty in walking, not elsewhere classified     Problem List Patient Active Problem List   Diagnosis Date Noted  . Mixed hyperlipidemia 02/27/2017  . Vitamin D deficiency 02/27/2017  . Hereditary hemochromatosis ([place]) 02/27/2017  . Atrial fibrillation ([place]) 02/27/2017     Kipp Brood, PT, DPT Physical Therapist with [facility]  07/31/2017 5:54 PM    [place], [place], [address] Phone: [phone]   Fax:  [phone]  Name: Amanda Mckay MRN: [id number] Date of Birth: [birth year]

## 2017-07-31 NOTE — Progress Notes (Signed)
[facility] History and Physical  Reason for Referral: Ventral hernia  Referring Physician: Dr. Peggye Form Amanda Mckay is a 72 y.o. female.  HPI: Amanda Mckay is a 72 yo who recently moved to the area from [place], and reports that Amanda Mckay has had a hernia at her umbilicus for years and was told it was ok to watch as long as it caused her no problems. Amanda Mckay reports that over the years it has gotten larger and more uncomfortable, but has never caused bad pain or gotten stuck out or tender. Amanda Mckay denies any prior obstructive symptoms.  Amanda Mckay is now a patient of Dr. Mannie Stabile, and has been seen by her. Amanda Mckay recently underwent a Cologuard that was negative and had a colonoscopy 10 years ago that was negative. Amanda Mckay was told by Dr. Mannie Stabile to come be evaluated for the hernia due to how large it was and also due to thinning of the skin at the umbilicus.     Past Medical History:  Diagnosis Date  . Atrial fibrillation ([place])   . Iron excess     Past Surgical History:  Procedure Laterality Date  . CESAREAN SECTION    . TONSILLECTOMY      Family History  Problem Relation Age of Onset  . Stroke Mother   . Atrial fibrillation Son   . Stroke Maternal Grandmother   . Diabetes Paternal Grandfather   . Arthritis Brother     Social History   Tobacco Use  . Smoking status: Never Smoker  . Smokeless tobacco: Never Used  Substance Use Topics  . Alcohol use: Yes    Alcohol/week: 1.2 oz    Types: 2 Glasses of wine per week  . Drug use: No    Medications: I have reviewed the patient's current medications. Allergies as of 07/31/2017      Reactions   Tetanus Toxoids       Medication List        Accurate as of 07/31/17  2:26 PM. Always use your most recent med list.          aspirin 325 MG tablet Take 325 mg by mouth daily.   CALCIUM 600 600 MG Tabs tablet Generic drug:  calcium carbonate Take 600 mg by mouth 2 (two) times daily with a meal.   D3-1000 1000 units  capsule Generic drug:  Cholecalciferol Take 1,000 Units by mouth daily.   famotidine 40 MG tablet Commonly known as:  PEPCID Take 40 mg by mouth as needed for heartburn or indigestion.   flecainide 100 MG tablet Commonly known as:  TAMBOCOR Take 150 mg by mouth 2 (two) times daily.   metoprolol tartrate 25 MG tablet Commonly known as:  LOPRESSOR Take 25 mg by mouth 2 (two) times daily.       ROS:  A comprehensive review of systems was negative except for: Cardiovascular: positive for a fib Gastrointestinal: positive for reflux symptoms Genitourinary: positive for frequency Musculoskeletal: positive for back pain and stiff joints dry skin  Blood pressure (!) 149/74, pulse 84, temperature 98.4 F (36.9 C), temperature source Temporal, resp. rate 18, weight 237 lb (107.5 kg). Physical Exam  Constitutional: Amanda Mckay is oriented to person, place, and time. Amanda Mckay appears well-developed and well-nourished. No distress.  HENT:  Head: Normocephalic and atraumatic.  Eyes: Pupils are equal, round, and reactive to light. EOM are normal.  Neck: Normal range of motion. Neck supple.  Cardiovascular: Normal rate and regular rhythm.  Pulmonary/Chest: Effort  normal and breath sounds normal.  Abdominal: Soft. Amanda Mckay exhibits no distension. There is tenderness. There is no rebound and no guarding. A hernia is present.  Large ventral hernia, nonreducible, lower midline scar, no umbilicus due to large hernia, fascial defect not appreciated, omentum likely in the sac but skin thinned with blood vessels of the sac visualized  Musculoskeletal: Normal range of motion. Amanda Mckay exhibits no edema.  Neurological: Amanda Mckay is alert and oriented to person, place, and time.  Skin: Skin is warm and dry.  Psychiatric: Amanda Mckay has a normal mood and affect. Her behavior is normal. Judgment and thought content normal.  Vitals reviewed.   Results: None   Assessment & Plan:  Amanda Mckay is a 72 y.o. female with a large  non-reducible ventral hernia at the level of the umbilicus but involving her prior ventral hernia.   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  -OR for open repair with mesh, opted for open repair due to the thinned redundant skin that will need to be excised to prevent from large seroma formation  -A fib but not on anticoagulation, only takes aspirin   All questions were answered to the satisfaction of the patient.  The risk and benefits of open ventral hernia repair with mesh were discussed including but not limited to bleeding, infection, risk of recurrence, need for using mesh, risk of injury to other organ and need for primary repair if any contamation.  After careful consideration, Amanda Mckay has decided to proceed.    Virl Cagey 07/31/2017, 2:26 PM

## 2017-07-31 NOTE — Patient Instructions (Signed)
Operate on Mondays, Wednesdays and Fridays.   Ventral Hernia A ventral hernia is a bulge of tissue from inside the abdomen that pushes through a weak area of the muscles that form the front wall of the abdomen. The tissues inside the abdomen are inside a sac (peritoneum). These tissues include the small intestine, large intestine, and the fatty tissue that covers the intestines (omentum). Sometimes, the bulge that forms a hernia contains intestines. Other hernias contain only fat. Ventral hernias do not go away without surgical treatment. There are several types of ventral hernias. You may have:  A hernia at an incision site from previous abdominal surgery (incisional hernia).  A hernia just above the belly button (epigastric hernia), or at the belly button (umbilical hernia). These types of hernias can develop from heavy lifting or straining.  A hernia that comes and goes (reducible hernia). It may be visible only when you lift or strain. This type of hernia can be pushed back into the abdomen (reduced).  A hernia that traps abdominal tissue inside the hernia (incarcerated hernia). This type of hernia does not reduce.  A hernia that cuts off blood flow to the tissues inside the hernia (strangulated hernia). The tissues can start to die if this happens. This is a very painful bulge that cannot be reduced. A strangulated hernia is a medical emergency.  What are the causes? This condition is caused by abdominal tissue putting pressure on an area of weakness in the abdominal muscles. What increases the risk? The following factors may make you more likely to develop this condition:  Being female.  Being 71 or older.  Being overweight or obese.  Having had previous abdominal surgery, especially if there was an infection after surgery.  Having had an injury to the abdominal wall.  Having had several pregnancies.  Having a buildup of fluid inside the abdomen (ascites).  What are the signs  or symptoms? The only symptom of a ventral hernia may be a painless bulge in the abdomen. A reducible hernia may be visible only when you strain, cough, or lift. Other symptoms may include:  Dull pain.  A feeling of pressure.  Signs and symptoms of a strangulated hernia may include:  Increasing pain.  Nausea and vomiting.  Pain when pressing on the hernia.  The skin over the hernia turning red or purple.  Constipation.  Blood in the stool (feces).  How is this diagnosed? This condition may be diagnosed based on:  Your symptoms.  Your medical history.  A physical exam. You may be asked to cough or strain while standing. These actions increase the pressure inside your abdomen and force the hernia through the opening in your muscles. Your health care provider may try to reduce the hernia by pressing on it.  Imaging studies, such as an ultrasound or CT scan.  How is this treated? This condition is treated with surgery. If you have a strangulated hernia, surgery is done as soon as possible. If your hernia is small and not incarcerated, you may be asked to lose some weight before surgery. Follow these instructions at home:  Follow instructions from your health care provider about eating or drinking restrictions.  If you are overweight, your health care provider may recommend that you increase your activity level and eat a healthier diet.  Do not lift anything that is heavier than 10 lb (4.5 kg).  Return to your normal activities as told by your health care provider. Ask your health care provider  what activities are safe for you. You may need to avoid activities that increase pressure on your hernia.  Take over-the-counter and prescription medicines only as told by your health care provider.  Keep all follow-up visits as told by your health care provider. This is important. Contact a health care provider if:  Your hernia gets larger.  Your hernia becomes painful. Get help  right away if:  Your hernia becomes increasingly painful.  You have pain along with any of the following: ? Changes in skin color in the area of the hernia. ? Nausea. ? Vomiting. ? Fever. Summary  A ventral hernia is a bulge of tissue from inside the abdomen that pushes through a weak area of the muscles that form the front wall of the abdomen.  This condition is treated with surgery, which may be urgent depending on your hernia.  Do not lift anything that is heavier than 10 lb (4.5 kg), and follow activity instructions from your health care provider. This information is not intended to replace advice given to you by your health care provider. Make sure you discuss any questions you have with your health care provider. Document Released: 02/19/2012 Document Revised: 10/20/2015 Document Reviewed: 10/20/2015 Elsevier Interactive Patient Education  [employer].

## 2017-08-01 DIAGNOSIS — K439 Ventral hernia without obstruction or gangrene: Secondary | ICD-10-CM

## 2017-08-01 NOTE — H&P (Signed)
[facility] History and Physical  Reason for Referral: Ventral hernia  Referring Physician: Dr. Peggye Form Mckay is a 72 y.o. female.  HPI: [name] is a 72 yo who recently moved to the area from [place], and reports that she has had a hernia at her umbilicus for years and was told it was ok to watch as long as it caused her no problems. She reports that over the years it has gotten larger and more uncomfortable, but has never caused bad pain or gotten stuck out or tender. She denies any prior obstructive symptoms.  She is now a patient of Dr. Mannie Mckay, and has been seen by her. She recently underwent a Cologuard that was negative and had a colonoscopy 10 years ago that was negative. She was told by Dr. Mannie Mckay to come be evaluated for the hernia due to how large it was and also due to thinning of the skin at the umbilicus.     Past Medical History:  Diagnosis Date  . Atrial fibrillation ([place])   . Iron excess     Past Surgical History:  Procedure Laterality Date  . CESAREAN SECTION    . TONSILLECTOMY      Family History  Problem Relation Age of Onset  . Stroke Mother   . Atrial fibrillation Son   . Stroke Maternal Grandmother   . Diabetes Paternal Grandfather   . Arthritis Brother     Social History   Tobacco Use  . Smoking status: Never Smoker  . Smokeless tobacco: Never Used  Substance Use Topics  . Alcohol use: Yes    Alcohol/week: 1.2 oz    Types: 2 Glasses of wine per week  . Drug use: No    Medications: I have reviewed the patient's current medications. Allergies as of 07/31/2017      Reactions   Tetanus Toxoids       Medication List        Accurate as of 07/31/17  2:26 PM. Always use your most recent med list.          aspirin 325 MG tablet Take 325 mg by mouth daily.   CALCIUM 600 600 MG Tabs tablet Generic drug:  calcium carbonate Take 600 mg by mouth 2 (two) times daily with a meal.   D3-1000 1000 units  capsule Generic drug:  Cholecalciferol Take 1,000 Units by mouth daily.   famotidine 40 MG tablet Commonly known as:  PEPCID Take 40 mg by mouth as needed for heartburn or indigestion.   flecainide 100 MG tablet Commonly known as:  TAMBOCOR Take 150 mg by mouth 2 (two) times daily.   metoprolol tartrate 25 MG tablet Commonly known as:  LOPRESSOR Take 25 mg by mouth 2 (two) times daily.       ROS:  A comprehensive review of systems was negative except for: Cardiovascular: positive for a fib Gastrointestinal: positive for reflux symptoms Genitourinary: positive for frequency Musculoskeletal: positive for back pain and stiff joints dry skin  Blood pressure (!) 149/74, pulse 84, temperature 98.4 F (36.9 C), temperature source Temporal, resp. rate 18, weight 237 lb (107.5 kg). Physical Exam  Constitutional: She is oriented to person, place, and time. She appears well-developed and well-nourished. No distress.  HENT:  Head: Normocephalic and atraumatic.  Eyes: Pupils are equal, round, and reactive to light. EOM are normal.  Neck: Normal range of motion. Neck supple.  Cardiovascular: Normal rate and regular rhythm.  Pulmonary/Chest: Effort  normal and breath sounds normal.  Abdominal: Soft. She exhibits no distension. There is tenderness. There is no rebound and no guarding. A hernia is present.  Large ventral hernia, nonreducible, lower midline scar, no umbilicus due to large hernia, fascial defect not appreciated, omentum likely in the sac but skin thinned with blood vessels of the sac visualized  Musculoskeletal: Normal range of motion. She exhibits no edema.  Neurological: She is alert and oriented to person, place, and time.  Skin: Skin is warm and dry.  Psychiatric: She has a normal mood and affect. Her behavior is normal. Judgment and thought content normal.  Vitals reviewed.   Results: None   Assessment & Plan:  Amanda Mckay is a 72 y.o. female with a large  non-reducible ventral hernia at the level of the umbilicus but involving her prior ventral hernia.   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  -OR for open repair with mesh, opted for open repair due to the thinned redundant skin that will need to be excised to prevent from large seroma formation  -A fib but not on anticoagulation, only takes aspirin   All questions were answered to the satisfaction of the patient.  The risk and benefits of open ventral hernia repair with mesh were discussed including but not limited to bleeding, infection, risk of recurrence, need for using mesh, risk of injury to other organ and need for primary repair if any contamation.  After careful consideration, [name] has decided to proceed.    Amanda Mckay 07/31/2017, 2:26 PM

## 2017-08-04 ENCOUNTER — Ambulatory Visit (HOSPITAL_COMMUNITY): Payer: Medicare Other

## 2017-08-04 ENCOUNTER — Telehealth (HOSPITAL_COMMUNITY): Payer: Self-pay

## 2017-08-04 DIAGNOSIS — M25562 Pain in left knee: Secondary | ICD-10-CM | POA: Diagnosis not present

## 2017-08-04 DIAGNOSIS — M25662 Stiffness of left knee, not elsewhere classified: Secondary | ICD-10-CM

## 2017-08-04 DIAGNOSIS — R262 Difficulty in walking, not elsewhere classified: Secondary | ICD-10-CM

## 2017-08-04 NOTE — Patient Instructions (Signed)
Standing Single Leg Stance with Counter Support REPS: 10  SETS: 3  HOLD: 15 seconds or more  WEEKLY: 7x  DAILY: 1x Setup Begin in a standing upright position with your hands resting on a counter. Movement Lift one foot off the ground and maintain your balance in this position. Tip Make sure to maintain an upright posture and use the counter to help you balance as needed. STEP 1 STEP 2 Standing Tandem Balance with Counter Support REPS: 10  SETS: 3  HOLD: 15 seconds or more  WEEKLY: 7x  DAILY: 1x Setup Begin in a standing upright position with your hands resting on a counter. Movement Place one foot directly behind the other, so that you are in a heel-to-toe position. Maintain your balance in this position. Tip Make sure to maintain an upright posture and use the counter to help you balance as needed. Prepared by Debara Pickett [address] [place], [place] Access your exercises! [email or web address] [place] Your Access Code: [email or web address]

## 2017-08-04 NOTE — Therapy (Signed)
[place], [place], [address] Phone: [phone]   Fax:  [phone]  Physical Therapy Treatment/Discharge Summary  Patient Details  Name: Amanda Mckay MRN: [id number] Date of Birth: July 08, 1945 Referring Provider: Caren Macadam    Encounter Date: 08/04/2017   Progress Note Reporting Period 07/15/17 to 08/04/17  See note below for Objective Data and Assessment of Progress/Goals.    PHYSICAL THERAPY DISCHARGE SUMMARY  Visits from Start of Care: 14  Current functional level related to goals / functional outcomes: Re-assessment performed today and patient has met/partially met 3/3 short term goals and 3/3 long-term goals. She improved her 5x sit to stand test from 17 seconds to <10 seconds with no UE support from standard height chair. Her gait velocity during 3 MWT also improved to 1.06 m/s indicating she has improved community ambulation access and is at decreased risk of falling. Her greatest limitation remains limited flexion and extension of Lt knee, however she has made more improvements into extension. AROM for Lt knee was 8-102 degrees compared to 30-98 degrees at initial evaluation. She has reported feeling a major improvement in her daily functioning and feeling confident she can continue independently with the exercise provided to her in therapy. She has also been educated on benefit of participating in a regular gym routine/program and was provided a handout on local [employer]. She will be discharged after this session.   Remaining deficits: See below details   Education / Equipment: Educated on progress twoards goals and readiness for discharge. Educated on new balance exercises and on resources for local fitness centers.  Plan: Patient agrees to discharge.  Patient goals were met. Patient is being discharged due to meeting the stated rehab goals.  ?????       PT End of Session - 08/04/17 0819    Visit Number   14    Number of Visits  16    Date for PT Re-Evaluation  08/13/17 Mini-reassess 07/15/17    Authorization Type  [employer] medicare    Authorization Time Period  06/13/17-08/13/17    Authorization - Visit Number  7    Authorization - Number of Visits  10    PT Start Time  0820    PT Stop Time  0846    PT Time Calculation (min)  26 min    Activity Tolerance  Patient tolerated treatment well;No increased pain    Behavior During Therapy  WFL for tasks assessed/performed       Past Medical History:  Diagnosis Date  . Atrial fibrillation ([place])   . Iron excess     Past Surgical History:  Procedure Laterality Date  . CESAREAN SECTION    . TONSILLECTOMY      There were no vitals filed for this visit.  Subjective Assessment - 08/04/17 0821    Subjective  Patient reports she woke up last night with some pain in her back but that it is not related to her knee and states her knee has not been bothering her. She reports it is not in pain but sometimes feels stiff or tight. She reports she did her exercise this weekend and got back to more yard work with no problem.    Pertinent History  Pt reports having some knee pain several years ago, saw an orthopedist who diagnoses knee OA, took PT and DC "fully recovered" about 5-6 years ago. Started having pain again, stiffness, difficulty walking about 3 weeks ago. Pt recently moved here  from the [place] area.     How long can you sit comfortably?  Not limited     How long can you stand comfortably?  20-30 minutes    How long can you walk comfortably?  20-30 minutes    Patient Stated Goals  Be able to perform all leisure activity withoutlimitation, enjoy her new home and garden    Currently in Pain?  No/denies         [facility] PT Assessment - 08/04/17 0001      Assessment   Medical Diagnosis  Left knee Pain/stiffness    Referring Provider  Caren Macadam     Onset Date/Surgical Date  05/23/17    Hand Dominance  Right    Prior Therapy  about 5 years       Precautions   Precautions  None      Restrictions   Weight Bearing Restrictions  No      Prior Function   Level of Independence  Independent      Cognition   Overall Cognitive Status  Within Functional Limits for tasks assessed      ROM / Strength   AROM / PROM / Strength  AROM      AROM   AROM Assessment Site  Knee    Right/Left Knee  Left    Left Knee Extension  8    Left Knee Flexion  101      Transfers   Five time sit to stand comments   9.86 seconds without UE suppport from standard chair      Ambulation/Gait   Ambulation/Gait  Yes    Ambulation/Gait Assistance  7: Independent    Ambulation Distance (Feet)  628 Feet    Assistive device  None    Gait Pattern  Left flexed knee in stance;Decreased hip/knee flexion - right;Decreased hip/knee flexion - left;Decreased stride length;Decreased stance time - left    Ambulation Surface  Level    Gait velocity  1.06 m/s       Balance Exercises - 08/04/17 0859      Balance Exercises: Standing   Tandem Stance  Eyes open;Foam/compliant surface;4 reps;15 secs;20 secs alternating foot alignment (4 reps each)    SLS  Eyes open;Solid surface;4 reps;15 secs 4 reps each Bil LE        PT Education - 08/04/17 0837    Education provided  Yes    Education Details  Educated on progress twoards goals and readiness for discharge. Educated on new balance exercises and on resources for local fitness centers.    Person(s) Educated  Patient    Methods  Explanation;Handout    Comprehension  Verbalized understanding       PT Short Term Goals - 08/04/17 0962      PT SHORT TERM GOAL #1   Title  After 4 weeks patient will demonstrate improved activity tolerance AEB tolerance of 3MWT without increased pain, averaging gait speed >0.73ms.     Baseline  07/15/17 - 0.96 m/s    Status  Achieved      PT SHORT TERM GOAL #2   Title  After 4 weeks patient will demonstrate improved functional strength AEB 5xSTS in <18sec hands free from 17"  high chair.     Baseline  07/15/17 - 17.33 seconds    Status  Achieved      PT SHORT TERM GOAL #3   Title  After 4 weeks patient will demonstrate Left knee ROM >25-110 degrees    Baseline  08/04/17 - 8-101    Status  Partially Met        PT Long Term Goals - 08/04/17 2706      PT LONG TERM GOAL #1   Title  After 8 weeks patient will demonstrate improved activity tolerance AEB tolerance of 6MWT without increased pain, averaging gait speed >1.39ms.     Baseline  08/04/17 - 1.06 m/s with 3MWT    Status  Achieved      PT LONG TERM GOAL #2   Title  After 8 weeks patient will demonstrate improved functional strength AEB 5xSTS in <15sec hands free from 17" high chair.     Baseline  08/04/17 - 9.86 seconds    Status  Achieved      PT LONG TERM GOAL #3   Title  After 8 weeks patient will demonstrate Left knee ROM >17-115 degrees.     Baseline  08/04/17 - 8-101    Status  Partially Met        Plan - 08/04/17 0853    Clinical Impression Statement  Re-assessment performed today and patient has met/partially met 3/3 short term goals and 3/3 long-term goals. She improved her 5x sit to stand test from 17 seconds to <10 seconds with no UE support from standard height chair. Her gait velocity during 3 MWT also improved to 1.06 m/s indicating she has improved community ambulation access and is at decreased risk of falling. Her greatest limitation remains limited flexion and extension of Lt knee, however she has made more improvements into extension. AROM for Lt knee was 8-102 degrees compared to 30-98 degrees at initial evaluation. She has reported feeling a major improvement in her daily functioning and feeling confident she can continue independently with the exercise provided to her in therapy. She has also been educated on benefit of participating in a regular gym routine/program and was provided a handout on local [employer]. She will be discharged after this session.    Rehab Potential  Fair    Clinical  Impairments Affecting Rehab Potential  chronicity of problem, low prior level of activity     PT Frequency  2x / week    PT Duration  8 weeks    PT Treatment/Interventions  Electrical Stimulation;Cryotherapy;Moist Heat;Stair training;Functional mobility training;Therapeutic activities;Therapeutic exercise;Patient/family education;Balance training;Manual techniques;Passive range of motion;Visual/perceptual remediation/compensation    PT Next Visit Plan  discharge this session    PT Home Exercise Plan  Eval: bridge, supine/seated heel slide, supine/seated LAQ; 06/16/17 - seated/standing heel raises; 06/19/17: HS stretch and toe raises.  07/07/17 - HS stretch on step, quad stretch on step; 07/09/17 - prolong knee extesion stretch; 07/31/17 - contract/relax for hamstring/quad; 08/04/17 - SLS and tandem at counter    Consulted and Agree with Plan of Care  Patient       Patient will benefit from skilled therapeutic intervention in order to improve the following deficits and impairments:  Abnormal gait, Decreased balance, Decreased mobility, Difficulty walking, Increased muscle spasms, Decreased range of motion, Increased edema, Decreased knowledge of precautions, Decreased activity tolerance, Decreased strength, Impaired flexibility, Pain, Obesity  Visit Diagnosis: Acute pain of left knee  Stiffness of left knee, not elsewhere classified  Difficulty in walking, not elsewhere classified     Problem List Patient Active Problem List   Diagnosis Date Noted  . Ventral hernia without obstruction or gangrene 08/01/2017  . Mixed hyperlipidemia 02/27/2017  . Vitamin D deficiency 02/27/2017  . [place]itary [place] ([place] 02/27/[place]  . Atrial fibrillation ([place] 02/27/2017  Kipp Brood, PT, DPT Physical Therapist with [facility]  08/04/2017 9:01 AM    [place], [place], [address] Phone: [phone]    Fax:  [phone]  Name: Amanda Mckay MRN: [id number] Date of Birth: 1945/12/24

## 2017-08-04 NOTE — Telephone Encounter (Signed)
Patient was discharged from therapy

## 2017-08-07 ENCOUNTER — Encounter (HOSPITAL_COMMUNITY): Payer: Medicare Other

## 2017-08-08 ENCOUNTER — Inpatient Hospital Stay (HOSPITAL_COMMUNITY): Admission: RE | Admit: 2017-08-08 | Payer: Medicare Other | Source: Ambulatory Visit

## 2017-08-12 ENCOUNTER — Encounter (HOSPITAL_COMMUNITY): Payer: Medicare Other

## 2017-08-14 ENCOUNTER — Encounter (HOSPITAL_COMMUNITY): Payer: Medicare Other

## 2017-08-15 ENCOUNTER — Ambulatory Visit: Admit: 2017-08-15 | Payer: Medicare Other | Admitting: General Surgery

## 2017-08-15 SURGERY — REPAIR, HERNIA, VENTRAL
Anesthesia: General

## 2017-08-21 ENCOUNTER — Other Ambulatory Visit: Payer: Self-pay | Admitting: Cardiovascular Disease

## 2017-08-21 ENCOUNTER — Encounter: Payer: Self-pay | Admitting: Family Medicine

## 2017-08-22 ENCOUNTER — Encounter: Payer: Self-pay | Admitting: Family Medicine

## 2017-09-05 ENCOUNTER — Encounter: Payer: Self-pay | Admitting: Family Medicine

## 2017-10-16 ENCOUNTER — Ambulatory Visit: Payer: Medicare Other | Admitting: Family Medicine

## 2017-10-30 ENCOUNTER — Encounter: Payer: Self-pay | Admitting: Family Medicine

## 2017-10-30 ENCOUNTER — Ambulatory Visit: Payer: Medicare Other | Admitting: Family Medicine

## 2017-10-30 VITALS — BP 126/74 | HR 90 | Resp 16 | Ht 67.0 in | Wt 239.0 lb

## 2017-10-30 DIAGNOSIS — H6121 Impacted cerumen, right ear: Secondary | ICD-10-CM

## 2017-10-30 DIAGNOSIS — K439 Ventral hernia without obstruction or gangrene: Secondary | ICD-10-CM

## 2017-10-30 NOTE — Progress Notes (Signed)
Patient ID: Amanda Mckay, female    DOB: 09-07-1945, 72 y.o.   MRN: [id number]  Chief Complaint  Patient presents with  . Follow-up    3 months    Allergies Tetanus toxoids  Subjective:   Amanda Mckay is a 72 y.o. female who presents to [facility] today.  HPI Here for follow up. Has been doing well. Family has been in from [place] for a month. Did not sleep well with all the company but was happy to have them.  Patient reports that she has an upcoming appointment with her new primary care physician at [facility].  She reports she wanted to come in today to discuss the possible upcoming hernia repair surgery with Dr. Constance Haw.  She would like to discuss whether she needs the surgery or not.  In addition, she reports that she did not get a letter regarding the results of her Pap smear.  She also reports that she did not get the referral for the earwax removal.  She reports she would be agreeable to having the hernia surgery if it is necessary.  She denies any ear pain today.  She reports that her ear does feel full from time to time and she has had issues with earwax buildup in the past.  She reports she has completed the physical therapy and her legs and knees are feeling much better.  She is doing some exercise but not routinely.  She has not lost any weight.  She is still taking her medications and following up with cardiology as directed.  She denies any chest pain, shortness of breath, or palpitations.   Past Medical History:  Diagnosis Date  . Atrial fibrillation ([place])   . Iron excess     Past Surgical History:  Procedure Laterality Date  . CESAREAN SECTION    . TONSILLECTOMY      Family History  Problem Relation Age of Onset  . Stroke Mother   . Atrial fibrillation Son   . Stroke Maternal Grandmother   . Diabetes Paternal Grandfather   . Arthritis Brother      Social History   Socioeconomic History  . Marital status: Married    Spouse  name: Not on file  . Number of children: Not on file  . Years of education: Not on file  . Highest education level: Not on file  Occupational History  . Not on file  Social Needs  . Financial resource strain: Not on file  . Food insecurity:    Worry: Not on file    Inability: Not on file  . Transportation needs:    Medical: Not on file    Non-medical: Not on file  Tobacco Use  . Smoking status: Never Smoker  . Smokeless tobacco: Never Used  Substance and Sexual Activity  . Alcohol use: Yes    Alcohol/week: 2.0 standard drinks    Types: 2 Glasses of wine per week  . Drug use: No  . Sexual activity: Yes  Lifestyle  . Physical activity:    Days per week: Not on file    Minutes per session: Not on file  . Stress: Not on file  Relationships  . Social connections:    Talks on phone: Not on file    Gets together: Not on file    Attends religious service: Not on file    Active member of club or organization: Not on file    Attends meetings of clubs or organizations:  Not on file    Relationship status: Not on file  Other Topics Concern  . Not on file  Social History Narrative   Retired Copywriter, advertising.   Married.   Has children 3. Grandchildren, 2.    Grew up in [place], [place].   Married for over 31 years.    Eats all food groups.   Just moved to [place], [place] in 8/18.    Current Outpatient Medications on File Prior to Visit  Medication Sig Dispense Refill  . acetaminophen (TYLENOL) 325 MG tablet Take 650 mg by mouth 2 (two) times daily.    [name] aspirin 325 MG tablet Take 325 mg by mouth every evening.     . Calcium Carb-Cholecalciferol (CALCIUM 600+D3 PO) Take 1 tablet by mouth 2 (two) times daily.    . Cholecalciferol (D3-1000) 1000 units capsule Take 1,000 Units by mouth daily.    . famotidine (PEPCID) 20 MG tablet Take 20-40 mg by mouth 2 (two) times daily as needed for heartburn or indigestion.    . flecainide (TAMBOCOR) 100 MG tablet Take 150 mg by mouth 2 (two)  times daily.    . metoprolol tartrate (LOPRESSOR) 25 MG tablet TAKE ONE TABLET BY MOUTH TWICE A DAY 180 tablet 3   No current facility-administered medications on file prior to visit.     Review of Systems  Constitutional: Negative for activity change, appetite change and fever.  HENT: Negative for ear discharge, ear pain and rhinorrhea.        Patient reports that when her ear is full of wax she cannot hear as well out of it.  Has issues with the right ear and buildup of earwax.  Eyes: Negative for visual disturbance.  Respiratory: Negative for cough, chest tightness and shortness of breath.   Cardiovascular: Negative for chest pain, palpitations and leg swelling.  Gastrointestinal: Negative for abdominal pain, nausea and vomiting.  Genitourinary: Negative for dysuria, frequency and urgency.  Neurological: Negative for dizziness, syncope and light-headedness.  Hematological: Negative for adenopathy.     Objective:   BP 126/74 (BP Location: Left Arm, Patient Position: Sitting, Cuff Size: Large)   Pulse 90   Resp 16   Ht 5\' 7"  (1.702 m)   Wt 239 lb (108.4 kg)   SpO2 94%   BMI 37.43 kg/m   Physical Exam  Constitutional: She is oriented to person, place, and time. She appears well-developed and well-nourished. No distress.  HENT:  Head: Normocephalic and atraumatic.  Left Ear: Hearing, tympanic membrane, external ear and ear canal normal.  Minimal amount of cerumen obstructing left external ear canal and this was curetted.  Right ear canal completely obstructed with cerumen.  Unable to visualize tympanic membrane.  Eyes: Pupils are equal, round, and reactive to light.  Neck: Normal range of motion. Neck supple. No thyromegaly present.  Cardiovascular: Normal rate, regular rhythm and normal heart sounds.  Pulmonary/Chest: Effort normal and breath sounds normal. No respiratory distress.  Abdominal: Soft. Bowel sounds are normal.  Neurological: She is alert and oriented to person,  place, and time. No cranial nerve deficit.  Skin: Skin is warm and dry.  Psychiatric: She has a normal mood and affect. Her behavior is normal. Thought content normal.  Nursing note and vitals reviewed.    Assessment and Plan  1. Hernia of abdominal wall Today I reviewed Dr. Constance Haw note with patient in the office today.  We discussed the note and Dr. Constance Haw recommendations.  Patient would like to proceed with  the surgery.  She had previously wanted to do the surgery but the timeframe was not compatible with her summer plans.  Patient reports that she will call their office and set up an office visit with Dr. Constance Haw and proceed with the surgery.  Patient was counseled concerning worrisome signs and symptoms of hernia complications and if those occur to go to the emergency department.  2. Impacted cerumen of right ear Refer to ENT for cerumen removal.  Ear was lavaged and curetted today without satisfactory removal of earwax.  Ear care discussed with patient.  She will follow-up with ENT.  Patient was given a copy of the letter which was mailed in May following the return of her Pap smear results.  Her Pap smear was within normal limits.  There was no evidence of abnormalities present.  Patient was verbally given this information and a copy of letter was given today. No follow-ups on file.  Follow-up with new PCP as directed.  Keep scheduled visits with cardiology.  Call with any questions or concerns. Caren Macadam, MD 10/30/2017

## 2017-11-12 ENCOUNTER — Other Ambulatory Visit: Payer: Self-pay

## 2017-11-12 ENCOUNTER — Ambulatory Visit (INDEPENDENT_AMBULATORY_CARE_PROVIDER_SITE_OTHER): Payer: Medicare Other | Admitting: Physician Assistant

## 2017-11-12 ENCOUNTER — Encounter: Payer: Self-pay | Admitting: Physician Assistant

## 2017-11-12 VITALS — BP 118/70 | HR 75 | Temp 97.9°F | Resp 16 | Ht 65.25 in | Wt 242.0 lb

## 2017-11-12 DIAGNOSIS — I480 Paroxysmal atrial fibrillation: Secondary | ICD-10-CM | POA: Diagnosis not present

## 2017-11-12 DIAGNOSIS — E782 Mixed hyperlipidemia: Secondary | ICD-10-CM

## 2017-11-12 DIAGNOSIS — K439 Ventral hernia without obstruction or gangrene: Secondary | ICD-10-CM

## 2017-11-12 DIAGNOSIS — Z0000 Encounter for general adult medical examination without abnormal findings: Secondary | ICD-10-CM | POA: Diagnosis not present

## 2017-11-12 NOTE — Progress Notes (Signed)
Patient ID: Amanda Mckay MRN: [id number], DOB: [birth year], 72 y.o. Date of Encounter: @DATE @  Chief Complaint:  Chief Complaint  Patient presents with  . New Patient (Initial Visit)    HPI: 72 y.o. year old female  presents as a new patient to establish care.  Her husband is also seeing me today as a new patient to establish care as well. They are both very pleasant and seem well educated.  They moved to this area from [facility] in August 2018. They report that this is near [place] the [place]. Reports that they have children who live in [place]. She is a retired Copywriter, advertising.  After moving here they had established PCP with Dr. Mannie Mckay at [facility].  However, Amanda Mckay reports Dr. Mannie Mckay is now going to a different practice so that is why they are reestablishing care here.   ------- Known medical history:  --- She has a history of atrial fibrillation.  I have reviewed her cardiology note from 04/02/2017. Ported to cardiology that she was first diagnosed with atrial fibrillation approximately 5 years ago.  She was placed on Xarelto for short period of time but then that was stopped.  She had been maintained on aspirin.  She also was started on metoprolol and flecainide and been maintained on that since that time.  Cardiology note did note her family history.  Both her father and her son have a for atrial fibrillation.  Her son is now age 42 but was first diagnosed with atrial fibrillation at age 48.  He also noted that her mother, maternal aunt, maternal grandmother had all had strokes.  Cardiology visit 04/02/2017 EKG showed sinus rhythm.  At that visit he did calculate chads score.  Systemic anticoagulation indicated to reduce thromboembolic risk.  And to discontinue aspirin and start Eliquis 5 mg twice daily.  And to continue metoprolol and flecainide. Today patient reports that she did not change the aspirin to Eliquis.   She was concerned of risk of Eliquis.  She will follow-up this discussion / decision with cardiology.  -- Also has ventral hernia.  She has seen Amanda Mckay who is a Psychologist, sport and exercise in [place].  Patient states that Amanda Mckay "recommended surgery but said it was not urgent ".  -- Has history of hereditary hemochromatosis   -- Also noted that she had been seeing physical therapy for her left knee.  She states that the left knee is improved.  -- She did have CPE to update preventive care with Amanda Mckay 07/16/2017.     Past Medical History:  Diagnosis Date  . Arthritis   . Atrial fibrillation ([place])   . Cataract   . GERD (gastroesophageal reflux disease)   . Hemochromatosis   . Iron excess      Home Meds: Outpatient Medications Prior to Visit  Medication Sig Dispense Refill  . acetaminophen (TYLENOL) 325 MG tablet Take 650 mg by mouth 2 (two) times daily as needed.     [name] aspirin 325 MG tablet Take 325 mg by mouth every evening.     . Calcium Carb-Cholecalciferol (CALCIUM 600+D3 PO) Take 1 tablet by mouth 2 (two) times daily.    . Cholecalciferol (D3-1000) 1000 units capsule Take 1,000 Units by mouth daily.    . famotidine (PEPCID) 20 MG tablet Take 20-40 mg by mouth 2 (two) times daily as needed for heartburn or indigestion.    . flecainide (TAMBOCOR) 100 MG tablet Take 150 mg  by mouth 2 (two) times daily.    . metoprolol tartrate (LOPRESSOR) 25 MG tablet TAKE ONE TABLET BY MOUTH TWICE A DAY 180 tablet 3   No facility-administered medications prior to visit.     Allergies:  Allergies  Allergen Reactions  . Tetanus Toxoids Other (See Comments)    Reaction occurred more than 40 yrs ago--does not remember what occurred.    Social History   Socioeconomic History  . Marital status: Married    Spouse name: Not on file  . Number of children: Not on file  . Years of education: Not on file  . Highest education level: Not on file  Occupational History  . Not on file  Social  Needs  . Financial resource strain: Not on file  . Food insecurity:    Worry: Not on file    Inability: Not on file  . Transportation needs:    Medical: Not on file    Non-medical: Not on file  Tobacco Use  . Smoking status: Never Smoker  . Smokeless tobacco: Never Used  Substance and Sexual Activity  . Alcohol use: Yes    Alcohol/week: 2.0 standard drinks    Types: 2 Glasses of wine per week  . Drug use: No  . Sexual activity: Yes  Lifestyle  . Physical activity:    Days per week: Not on file    Minutes per session: Not on file  . Stress: Not on file  Relationships  . Social connections:    Talks on phone: Not on file    Gets together: Not on file    Attends religious service: Not on file    Active member of club or organization: Not on file    Attends meetings of clubs or organizations: Not on file    Relationship status: Not on file  . Intimate partner violence:    Fear of current or ex partner: Not on file    Emotionally abused: Not on file    Physically abused: Not on file    Forced sexual activity: Not on file  Other Topics Concern  . Not on file  Social History Narrative   Retired Copywriter, advertising.   Married.   Has children 3. Grandchildren, 2.    Grew up in [place], [place].   Married for over 31 years.    Eats all food groups.   Just moved to [place], [place] in 8/18.     Family History  Problem Relation Age of Onset  . Stroke Mother   . Atrial fibrillation Son   . Stroke Maternal Grandmother   . Diabetes Paternal Grandfather   . Arthritis Brother      Review of Systems:  See HPI for pertinent ROS. All other ROS negative.    Physical Exam: Blood pressure 118/70, pulse 75, temperature 97.9 F (36.6 C), temperature source Oral, resp. rate 16, height 5' 5.25" (1.657 m), weight 109.8 kg, SpO2 94 %., Body mass index is 39.96 kg/m. General: WF. Appears in no acute distress. Neck: Supple. No thyromegaly. No lymphadenopathy. No carotid bruits. Lungs:  Clear bilaterally to auscultation without wheezes, rales, or rhonchi. Breathing is unlabored. Heart: RRR with S1 S2. No murmurs, rubs, or gallops. Abdomen: 12 x 14cm hernia, very large; not reducible. Hernia with evidence of skin thinning and telangectasias present. Musculoskeletal:  Strength and tone normal for age. Extremities/Skin: Warm and dry. No clubbing or cyanosis. No edema. No rashes or suspicious lesions. Neuro: Alert and oriented X 3. Moves all extremities  spontaneously. Gait is normal. CNII-XII grossly in tact. Psych:  Responds to questions appropriately with a normal affect.     ASSESSMENT AND PLAN:  72 y.o. year old female with   1. Encounter for medical examination to establish care   2. Paroxysmal atrial fibrillation [facility]) This is now managed by cardiology. She was in normal sinus rhythm at the time of their visit They have recommended Eliquis but she has continued aspirin.  She will continue this conversation with cardiology. She is on flecainide and metoprolol.  3. Mixed hyperlipidemia Note dated 07/16/2017 states " she defers cholesterol medication at this time.  We discussed that statins decrease risk of cardiovascular complications from elevated cholesterol.  Her ASCVD cardiac risk is indicative of starting statin therapy.  She would like to initiate/continue diet and total lifestyle modifications at this time to improve her cholesterol.  Reports that she will return in 2 to 3 months to recheck and would consider therapy if not to goal.  Discussed that LDL goal less than 100.  4. Hereditary hemochromatosis ([place]) Monitor every 6 months.  - Iron, TIBC and Ferritin Panel  5. Ventral hernia without obstruction or gangrene I encouraged her to follow-up with surgeon and go ahead and have surgery for this and not to put off having this surgery.    Preventive Care Her last CPE with Dr. Mannie Mckay was 07/16/2017. She will schedule CPE with me for after 07/16/2017.  Can schedule  for early morning and come fasting to that visit.   Signed, [address] [place], [place], [facility] 11/12/2017 10:13 AM

## 2017-11-13 LAB — IRON,TIBC AND FERRITIN PANEL
%SAT: 53 % (calc) — ABNORMAL HIGH (ref 16–45)
FERRITIN: 275 ng/mL (ref 16–288)
Iron: 147 ug/dL (ref 45–160)
TIBC: 277 mcg/dL (calc) (ref 250–450)

## 2017-12-15 ENCOUNTER — Ambulatory Visit (INDEPENDENT_AMBULATORY_CARE_PROVIDER_SITE_OTHER): Payer: Medicare Other | Admitting: Otolaryngology

## 2017-12-15 DIAGNOSIS — H9011 Conductive hearing loss, unilateral, right ear, with unrestricted hearing on the contralateral side: Secondary | ICD-10-CM

## 2017-12-15 DIAGNOSIS — H6121 Impacted cerumen, right ear: Secondary | ICD-10-CM

## 2018-01-05 ENCOUNTER — Ambulatory Visit (INDEPENDENT_AMBULATORY_CARE_PROVIDER_SITE_OTHER): Payer: Medicare Other

## 2018-01-05 ENCOUNTER — Ambulatory Visit (INDEPENDENT_AMBULATORY_CARE_PROVIDER_SITE_OTHER): Payer: Medicare Other | Admitting: Otolaryngology

## 2018-01-05 DIAGNOSIS — H6121 Impacted cerumen, right ear: Secondary | ICD-10-CM

## 2018-01-05 DIAGNOSIS — Z23 Encounter for immunization: Secondary | ICD-10-CM | POA: Diagnosis not present

## 2018-01-05 NOTE — Progress Notes (Signed)
Patient was in office for high dose flu vaccine. Patient received vaccine in her left deltoid.Patient tolerated well.

## 2018-01-20 ENCOUNTER — Ambulatory Visit: Payer: Medicare Other | Admitting: General Surgery

## 2018-01-20 ENCOUNTER — Encounter: Payer: Self-pay | Admitting: General Surgery

## 2018-01-20 VITALS — BP 162/78 | HR 84 | Temp 98.0°F | Resp 20 | Wt 246.0 lb

## 2018-01-20 DIAGNOSIS — K439 Ventral hernia without obstruction or gangrene: Secondary | ICD-10-CM | POA: Diagnosis not present

## 2018-01-20 NOTE — Progress Notes (Signed)
[facility] History and Physical   Chief Complaint    Hernia      Amanda Mckay is a 72 y.o. female.  HPI: Amanda Mckay is a 72 yo who I previously saw 07/2017 for her ventral hernia after referral by Dr. Mannie Stabile. She says she chickened out and did not get the surgery due to concern about being able to spend time with her grandchildren over the summer. She has had the hernia for years, and it is associated with her vertical C section scar.  She says that it has continued to get larger over the years and is uncomfortable at times. She says she feels like she has some nausea/ indigestion, but is not sure if these are related to her hernia. She has never had the area get hard but it remains stuck out and is not reducible.  She had cologuard that was negative back in the spring, and has decided to proceed with her hernia repair.   She takes Aspirin for her A fib but is not on any coumadin or other blood thinner. Her last EKG in the system from 03/2017 is Sinus rhythm.   Past Medical History:  Diagnosis Date  . Arthritis   . Atrial fibrillation ([place])   . Cataract   . GERD (gastroesophageal reflux disease)   . Hemochromatosis   . Iron excess     Past Surgical History:  Procedure Laterality Date  . CESAREAN SECTION    . TONSILLECTOMY      Family History  Problem Relation Age of Onset  . Stroke Mother   . Atrial fibrillation Son   . Stroke Maternal Grandmother   . Diabetes Paternal Grandfather   . Arthritis Brother     Social History   Tobacco Use  . Smoking status: Never Smoker  . Smokeless tobacco: Never Used  Substance Use Topics  . Alcohol use: Yes    Alcohol/week: 2.0 standard drinks    Types: 2 Glasses of wine per week  . Drug use: No    Medications: I have reviewed the patient's current medications. Allergies as of 01/20/2018      Reactions   Tetanus Toxoids Other (See Comments)   Reaction occurred more than 40 yrs ago--does not remember what occurred.        Medication List        Accurate as of 01/20/18 10:35 AM. Always use your most recent med list.          acetaminophen 325 MG tablet Commonly known as:  TYLENOL Take 650 mg by mouth 2 (two) times daily as needed.   aspirin 325 MG tablet Take 325 mg by mouth every evening.   CALCIUM 600+D3 PO Take 1 tablet by mouth 2 (two) times daily.   D3-1000 1000 units capsule Generic drug:  Cholecalciferol Take 1,000 Units by mouth daily.   famotidine 20 MG tablet Commonly known as:  PEPCID Take 20-40 mg by mouth 2 (two) times daily as needed for heartburn or indigestion.   flecainide 100 MG tablet Commonly known as:  TAMBOCOR Take 150 mg by mouth 2 (two) times daily.   metoprolol tartrate 25 MG tablet Commonly known as:  LOPRESSOR TAKE ONE TABLET BY MOUTH TWICE A DAY        ROS:  A comprehensive review of systems was negative except for: Respiratory: positive for SOB with activity/ exertion Gastrointestinal: positive for nausea, reflux symptoms and hernia defect/ thinning skin over hernia  Blood pressure (!) 162/78, pulse 84,  temperature 98 F (36.7 C), temperature source Temporal, resp. rate 20, weight 246 lb (111.6 kg). Physical Exam  Constitutional: She is oriented to person, place, and time. She appears well-developed.  HENT:  Head: Normocephalic and atraumatic.  Eyes: Pupils are equal, round, and reactive to light. EOM are normal.  Neck: Normal range of motion. Neck supple.  Cardiovascular: Normal rate.  Pulmonary/Chest: Effort normal and breath sounds normal.  Abdominal: Soft. She exhibits no distension. There is tenderness. A hernia is present.  Ventral hernia, thinning skin, nonreducible, fascial defect difficult to appreciate, likely about 5cm total, tender over the hernia, no umbilicus  Musculoskeletal: Normal range of motion. She exhibits edema.  Minimal edema at ankles bilaterally  Neurological: She is alert and oriented to person, place, and time.   Skin: Skin is warm and dry.  Psychiatric: She has a normal mood and affect. Her behavior is normal. Judgment and thought content normal.  Vitals reviewed.     Results: None   Assessment & Plan:  Amanda Mckay is a 72 y.o. female with a known ventral hernia after a vertical C section. This is nonreducible and has thinning skin over the area. We discussed prior to do an open repair with mesh and to excise the skin to prevent seroma formation and to obtain better cosmesis.  A laparoscopic repair would leave a large skin defect with protruding skin, and would not be appropriate.   -Ventral hernia repair with mesh, open  -Hold aspirin 5 days prior  All questions were answered to the satisfaction of the patient.  The risk and benefits of open hernia repair with mesh were discussed including but not limited to bleeding, infection, risk of injury to bowel, risk of not being able to use mesh if this occurs, risk of recurrence.  After careful consideration, Amanda Mckay has decided to proceed.    Amanda Mckay 01/20/2018, 10:35 AM

## 2018-01-20 NOTE — H&P (Signed)
[facility] History and Physical      Chief Complaint    Hernia      Amanda Mckay is a 72 y.o. female.  HPI: Amanda Mckay is a 72 yo who I previously saw 07/2017 for her ventral hernia after referral by Dr. Mannie Stabile. She says she chickened out and did not get the surgery due to concern about being able to spend time with her grandchildren over the summer. She has had the hernia for years, and it is associated with her vertical C section scar.  She says that it has continued to get larger over the years and is uncomfortable at times. She says she feels like she has some nausea/ indigestion, but is not sure if these are related to her hernia. She has never had the area get hard but it remains stuck out and is not reducible.  She had cologuard that was negative back in the spring, and has decided to proceed with her hernia repair.   She takes Aspirin for her A fib but is not on any coumadin or other blood thinner. Her last EKG in the system from 03/2017 is Sinus rhythm.       Past Medical History:  Diagnosis Date  . Arthritis   . Atrial fibrillation ([place])   . Cataract   . GERD (gastroesophageal reflux disease)   . Hemochromatosis   . Iron excess          Past Surgical History:  Procedure Laterality Date  . CESAREAN SECTION    . TONSILLECTOMY           Family History  Problem Relation Age of Onset  . Stroke Mother   . Atrial fibrillation Son   . Stroke Maternal Grandmother   . Diabetes Paternal Grandfather   . Arthritis Brother     Social History        Tobacco Use  . Smoking status: Never Smoker  . Smokeless tobacco: Never Used  Substance Use Topics  . Alcohol use: Yes    Alcohol/week: 2.0 standard drinks    Types: 2 Glasses of wine per week  . Drug use: No    Medications: I have reviewed the patient's current medications.      Allergies as of 01/20/2018      Reactions   Tetanus Toxoids Other (See Comments)    Reaction occurred more than 40 yrs ago--does not remember what occurred.               Medication List            Accurate as of 01/20/18 10:35 AM. Always use your most recent med list.           acetaminophen 325 MG tablet Commonly known as:  TYLENOL Take 650 mg by mouth 2 (two) times daily as needed.   aspirin 325 MG tablet Take 325 mg by mouth every evening.   CALCIUM 600+D3 PO Take 1 tablet by mouth 2 (two) times daily.   D3-1000 1000 units capsule Generic drug:  Cholecalciferol Take 1,000 Units by mouth daily.   famotidine 20 MG tablet Commonly known as:  PEPCID Take 20-40 mg by mouth 2 (two) times daily as needed for heartburn or indigestion.   flecainide 100 MG tablet Commonly known as:  TAMBOCOR Take 150 mg by mouth 2 (two) times daily.   metoprolol tartrate 25 MG tablet Commonly known as:  LOPRESSOR TAKE ONE TABLET BY MOUTH TWICE A DAY  ROS:  A comprehensive review of systems was negative except for: Respiratory: positive for SOB with activity/ exertion Gastrointestinal: positive for nausea, reflux symptoms and hernia defect/ thinning skin over hernia  Blood pressure (!) 162/78, pulse 84, temperature 98 F (36.7 C), temperature source Temporal, resp. rate 20, weight 246 lb (111.6 kg). Physical Exam  Constitutional: She is oriented to person, place, and time. She appears well-developed.  HENT:  Head: Normocephalic and atraumatic.  Eyes: Pupils are equal, round, and reactive to light. EOM are normal.  Neck: Normal range of motion. Neck supple.  Cardiovascular: Normal rate.  Pulmonary/Chest: Effort normal and breath sounds normal.  Abdominal: Soft. She exhibits no distension. There is tenderness. A hernia is present.  Ventral hernia, thinning skin, nonreducible, fascial defect difficult to appreciate, likely about 5cm total, tender over the hernia, no umbilicus  Musculoskeletal: Normal range of motion. She exhibits edema.    Minimal edema at ankles bilaterally  Neurological: She is alert and oriented to person, place, and time.  Skin: Skin is warm and dry.  Psychiatric: She has a normal mood and affect. Her behavior is normal. Judgment and thought content normal.  Vitals reviewed.     Results: None   Assessment & Plan:  Amanda Mckay is a 72 y.o. female with a known ventral hernia after a vertical C section. This is nonreducible and has thinning skin over the area. We discussed prior to do an open repair with mesh and to excise the skin to prevent seroma formation and to obtain better cosmesis.  A laparoscopic repair would leave a large skin defect with protruding skin, and would not be appropriate.   -Ventral hernia repair with mesh, open  -Hold aspirin 5 days prior  All questions were answered to the satisfaction of the patient.  The risk and benefits of open hernia repair with mesh were discussed including but not limited to bleeding, infection, risk of injury to bowel, risk of not being able to use mesh if this occurs, risk of recurrence.  After careful consideration, Amanda Mckay has decided to proceed.    Virl Cagey 01/20/2018, 10:35 AM

## 2018-01-20 NOTE — Patient Instructions (Signed)
Amanda Mckay  01/20/2018     @PREFPERIOPPHARMACY @   Your procedure is scheduled on  01/26/2018 .  Report to [facility] at  [place].  Call this number if you have problems the morning of surgery:  [phone]   Remember:  Do not eat or drink after midnight.                         Take these medicines the morning of surgery with A SIP OF WATER  Pepcid, flacainide, metoprolol.    Do not wear jewelry, make-up or nail polish.  Do not wear lotions, powders, or perfumes, or deodorant.  Do not shave 48 hours prior to surgery.  Men may shave face and neck.  Do not bring valuables to the hospital.  [facility] is not responsible for any belongings or valuables.  Contacts, dentures or bridgework may not be worn into surgery.  Leave your suitcase in the car.  After surgery it may be brought to your room.  For patients admitted to the hospital, discharge time will be determined by your treatment team.  Patients discharged the day of surgery will not be allowed to drive home.   Name and phone number of your driver:   family Special instructions:  None  Please read over the following fact sheets that you were given. Anesthesia Post-op Instructions and Care and Recovery After Surgery       General Anesthesia, Adult General anesthesia is the use of medicines to make a person "go to sleep" (be unconscious) for a medical procedure. General anesthesia is often recommended when a procedure:  Is long.  Requires you to be still or in an unusual position.  Is major and can cause you to lose blood.  Is impossible to do without general anesthesia.  The medicines used for general anesthesia are called general anesthetics. In addition to making you sleep, the medicines:  Prevent pain.  Control your blood pressure.  Relax your muscles.  Tell a health care provider about:  Any allergies you have.  All medicines you are taking, including vitamins, herbs, eye  drops, creams, and over-the-counter medicines.  Any problems you or family members have had with anesthetic medicines.  Types of anesthetics you have had in the past.  Any bleeding disorders you have.  Any surgeries you have had.  Any medical conditions you have.  Any history of heart or lung conditions, such as heart failure, sleep apnea, or chronic obstructive pulmonary disease (COPD).  Whether you are pregnant or may be pregnant.  Whether you use tobacco, alcohol, marijuana, or street drugs.  Any history of Armed forces logistics/support/administrative officer.  Any history of depression or anxiety. What are the risks? Generally, this is a safe procedure. However, problems may occur, including:  Allergic reaction to anesthetics.  Lung and heart problems.  Inhaling food or liquids from your stomach into your lungs (aspiration).  Injury to nerves.  Waking up during your procedure and being unable to move (rare).  Extreme agitation or a state of mental confusion (delirium) when you wake up from the anesthetic.  Air in the bloodstream, which can lead to stroke.  These problems are more likely to develop if you are having a major surgery or if you have an advanced medical condition. You can prevent some of these complications by answering all of your health care provider's questions thoroughly and by following  all pre-procedure instructions. General anesthesia can cause side effects, including:  Nausea or vomiting  A sore throat from the breathing tube.  Feeling cold or shivery.  Feeling tired, washed out, or achy.  Sleepiness or drowsiness.  Confusion or agitation.  What happens before the procedure? Staying hydrated Follow instructions from your health care provider about hydration, which may include:  Up to 2 hours before the procedure - you may continue to drink clear liquids, such as water, clear fruit juice, black coffee, and plain tea.  Eating and drinking restrictions Follow instructions  from your health care provider about eating and drinking, which may include:  8 hours before the procedure - stop eating heavy meals or foods such as meat, fried foods, or fatty foods.  6 hours before the procedure - stop eating light meals or foods, such as toast or cereal.  6 hours before the procedure - stop drinking milk or drinks that contain milk.  2 hours before the procedure - stop drinking clear liquids.  Medicines  Ask your health care provider about: ? Changing or stopping your regular medicines. This is especially important if you are taking diabetes medicines or blood thinners. ? Taking medicines such as aspirin and ibuprofen. These medicines can thin your blood. Do not take these medicines before your procedure if your health care provider instructs you not to. ? Taking new dietary supplements or medicines. Do not take these during the week before your procedure unless your health care provider approves them.  If you are told to take a medicine or to continue taking a medicine on the day of the procedure, take the medicine with sips of water. General instructions   Ask if you will be going home the same day, the following day, or after a longer hospital stay. ? Plan to have someone take you home. ? Plan to have someone stay with you for the first 24 hours after you leave the hospital or clinic.  For 3-6 weeks before the procedure, try not to use any tobacco products, such as cigarettes, chewing tobacco, and e-cigarettes.  You may brush your teeth on the morning of the procedure, but make sure to spit out the toothpaste. What happens during the procedure?  You will be given anesthetics through a mask and through an IV tube in one of your veins.  You may receive medicine to help you relax (sedative).  As soon as you are asleep, a breathing tube may be used to help you breathe.  An anesthesia specialist will stay with you throughout the procedure. He or she will help  keep you comfortable and safe by continuing to give you medicines and adjusting the amount of medicine that you get. He or she will also watch your blood pressure, pulse, and oxygen levels to make sure that the anesthetics do not cause any problems.  If a breathing tube was used to help you breathe, it will be removed before you wake up. The procedure may vary among health care providers and hospitals. What happens after the procedure?  You will wake up, often slowly, after the procedure is complete, usually in a recovery area.  Your blood pressure, heart rate, breathing rate, and blood oxygen level will be monitored until the medicines you were given have worn off.  You may be given medicine to help you calm down if you feel anxious or agitated.  If you will be going home the same day, your health care provider may check to make  sure you can stand, drink, and urinate.  Your health care providers will treat your pain and side effects before you go home.  Do not drive for 24 hours if you received a sedative.  You may: ? Feel nauseous and vomit. ? Have a sore throat. ? Have mental slowness. ? Feel cold or shivery. ? Feel sleepy. ? Feel tired. ? Feel sore or achy, even in parts of your body where you did not have surgery. This information is not intended to replace advice given to you by your health care provider. Make sure you discuss any questions you have with your health care provider. Document Released: 06/11/2007 Document Revised: 08/15/2015 Document Reviewed: 02/16/2015 Elsevier Interactive Patient Education  2018 [place] Anesthesia, Adult, Care After These instructions provide you with information about caring for yourself after your procedure. Your health care provider may also give you more specific instructions. Your treatment has been planned according to current medical practices, but problems sometimes occur. Call your health care provider if you have any  problems or questions after your procedure. What can I expect after the procedure? After the procedure, it is common to have:  Vomiting.  A sore throat.  Mental slowness.  It is common to feel:  Nauseous.  Cold or shivery.  Sleepy.  Tired.  Sore or achy, even in parts of your body where you did not have surgery.  Follow these instructions at home: For at least 24 hours after the procedure:  Do not: ? Participate in activities where you could fall or become injured. ? Drive. ? Use heavy machinery. ? Drink alcohol. ? Take sleeping pills or medicines that cause drowsiness. ? Make important decisions or sign legal documents. ? Take care of children on your own.  Rest. Eating and drinking  If you vomit, drink water, juice, or soup when you can drink without vomiting.  Drink enough fluid to keep your urine clear or pale yellow.  Make sure you have little or no nausea before eating solid foods.  Follow the diet recommended by your health care provider. General instructions  Have a responsible adult stay with you until you are awake and alert.  Return to your normal activities as told by your health care provider. Ask your health care provider what activities are safe for you.  Take over-the-counter and prescription medicines only as told by your health care provider.  If you smoke, do not smoke without supervision.  Keep all follow-up visits as told by your health care provider. This is important. Contact a health care provider if:  You continue to have nausea or vomiting at home, and medicines are not helpful.  You cannot drink fluids or start eating again.  You cannot urinate after 8-12 hours.  You develop a skin rash.  You have fever.  You have increasing redness at the site of your procedure. Get help right away if:  You have difficulty breathing.  You have chest pain.  You have unexpected bleeding.  You feel that you are having a  life-threatening or urgent problem. This information is not intended to replace advice given to you by your health care provider. Make sure you discuss any questions you have with your health care provider. Document Released: 06/10/2000 Document Revised: 08/07/2015 Document Reviewed: 02/16/2015 Elsevier Interactive Patient Education  2018 [place] Repair, Adult Open hernia repair is a surgical procedure to fix a hernia. A hernia occurs when an internal organ or tissue pushes out  through a weak spot in the abdominal wall muscles. Hernias commonly occur in the groin and around the navel. Most hernias tend to get worse over time. Often, surgery is done to prevent the hernia from becoming bigger, uncomfortable, or an emergency. Emergency surgery may be needed if abdominal contents get stuck in the opening (incarcerated hernia) or the blood supply gets cut off (strangulated hernia). In an open repair, an incision is made in the abdomen to perform the surgery. Tell a health care provider about:  Any allergies you have.  All medicines you are taking, including vitamins, herbs, eye drops, creams, and over-the-counter medicines.  Any problems you or family members have had with anesthetic medicines.  Any blood or bone disorders you have.  Any surgeries you have had.  Any medical conditions you have, including any recent cold or flu symptoms.  Whether you are pregnant or may be pregnant. What are the risks? Generally, this is a safe procedure. However, problems may occur, including:  Long-lasting (chronic) pain.  Bleeding.  Infection.  Damage to the testicle. This can cause shrinking or swelling.  Damage to the bladder, blood vessels, intestine, or nerves near the hernia.  Trouble passing urine.  Allergic reactions to medicines.  Return of the hernia.  What happens before the procedure? Staying hydrated Follow instructions from your health care provider about  hydration, which may include:  Up to 2 hours before the procedure - you may continue to drink clear liquids, such as water, clear fruit juice, black coffee, and plain tea.  Eating and drinking restrictions Follow instructions from your health care provider about eating and drinking, which may include:  8 hours before the procedure - stop eating heavy meals or foods such as meat, fried foods, or fatty foods.  6 hours before the procedure - stop eating light meals or foods, such as toast or cereal.  6 hours before the procedure - stop drinking milk or drinks that contain milk.  2 hours before the procedure - stop drinking clear liquids.  Medicines  Ask your health care provider about: ? Changing or stopping your regular medicines. This is especially important if you are taking diabetes medicines or blood thinners. ? Taking medicines such as aspirin and ibuprofen. These medicines can thin your blood. Do not take these medicines before your procedure if your health care provider instructs you not to.  You may be given antibiotic medicine to help prevent infection. General instructions  You may have blood tests or imaging studies.  Ask your health care provider how your surgical site will be marked or identified.  If you smoke, do not smoke for at least 2 weeks before your procedure or for as long as told by your health care provider.  Let your health care provider know if you develop a cold or any infection before your surgery.  Plan to have someone take you home from the hospital or clinic.  If you will be going home right after the procedure, plan to have someone with you for 24 hours. What happens during the procedure?  To reduce your risk of infection: ? Your health care team will wash or sanitize their hands. ? Your skin will be washed with soap. ? Hair may be removed from the surgical area.  An IV tube will be inserted into one of your veins.  You will be given one or  more of the following: ? A medicine to help you relax (sedative). ? A medicine to numb the  area (local anesthetic). ? A medicine to make you fall asleep (general anesthetic).  Your surgeon will make an incision over the hernia.  The tissues of the hernia will be moved back into place.  The edges of the hernia may be stitched together.  The opening in the abdominal muscles will be closed with stitches (sutures). Or, your surgeon will place a mesh patch made of manmade (synthetic) material over the opening.  The incision will be closed.  A bandage (dressing) may be placed over the incision. The procedure may vary among health care providers and hospitals. What happens after the procedure?  Your blood pressure, heart rate, breathing rate, and blood oxygen level will be monitored until the medicines you were given have worn off.  You may be given medicine for pain.  Do not drive for 24 hours if you received a sedative. This information is not intended to replace advice given to you by your health care provider. Make sure you discuss any questions you have with your health care provider. Document Released: 08/28/2000 Document Revised: 09/22/2015 Document Reviewed: 08/16/2015 Elsevier Interactive Patient Education  2018 [place], Adult, Care After These instructions give you information about caring for yourself after your procedure. Your doctor may also give you more specific instructions. If you have problems or questions, contact your doctor. Follow these instructions at home: Surgical cut (incision) care   Follow instructions from your doctor about how to take care of your surgical cut area. Make sure you: ? Wash your hands with soap and water before you change your bandage (dressing). If you cannot use soap and water, use hand sanitizer. ? Change your bandage as told by your doctor. ? Leave stitches (sutures), skin glue, or skin tape (adhesive) strips in  place. They may need to stay in place for 2 weeks or longer. If tape strips get loose and curl up, you may trim the loose edges. Do not remove tape strips completely unless your doctor says it is okay.  Check your surgical cut every day for signs of infection. Check for: ? More redness, swelling, or pain. ? More fluid or blood. ? Warmth. ? Pus or a bad smell. Activity  Do not drive or use heavy machinery while taking prescription pain medicine. Do not drive until your doctor says it is okay.  Until your doctor says it is okay: ? Do not lift anything that is heavier than 10 lb (4.5 kg). ? Do not play contact sports.  Return to your normal activities as told by your doctor. Ask your doctor what activities are safe. General instructions  To prevent or treat having a hard time pooping (constipation) while you are taking prescription pain medicine, your doctor may recommend that you: ? Drink enough fluid to keep your pee (urine) clear or pale yellow. ? Take over-the-counter or prescription medicines. ? Eat foods that are high in fiber, such as fresh fruits and vegetables, whole grains, and beans. ? Limit foods that are high in fat and processed sugars, such as fried and sweet foods.  Take over-the-counter and prescription medicines only as told by your doctor.  Do not take baths, swim, or use a hot tub until your doctor says it is okay.  Keep all follow-up visits as told by your doctor. This is important. Contact a doctor if:  You develop a rash.  You have more redness, swelling, or pain around your surgical cut.  You have more fluid or blood coming from  your surgical cut.  Your surgical cut feels warm to the touch.  You have pus or a bad smell coming from your surgical cut.  You have a fever or chills.  You have blood in your poop (stool).  You have not pooped in 2-3 days.  Medicine does not help your pain. Get help right away if:  You have chest pain or you are short of  breath.  You feel light-headed.  You feel weak and dizzy (feel faint).  You have very bad pain.  You throw up (vomit) and your pain is worse. This information is not intended to replace advice given to you by your health care provider. Make sure you discuss any questions you have with your health care provider. Document Released: 03/25/2014 Document Revised: 09/22/2015 Document Reviewed: 08/16/2015 Elsevier Interactive Patient Education  [employer].

## 2018-01-21 ENCOUNTER — Encounter (HOSPITAL_COMMUNITY): Payer: Self-pay

## 2018-01-21 ENCOUNTER — Encounter (HOSPITAL_COMMUNITY)
Admission: RE | Admit: 2018-01-21 | Discharge: 2018-01-21 | Disposition: A | Discharge status: Home / Self Care | Payer: Medicare Other | Source: Ambulatory Visit | Attending: General Surgery | Admitting: General Surgery

## 2018-01-21 ENCOUNTER — Other Ambulatory Visit: Payer: Self-pay

## 2018-01-21 DIAGNOSIS — Z01812 Encounter for preprocedural laboratory examination: Secondary | ICD-10-CM | POA: Insufficient documentation

## 2018-01-21 HISTORY — DX: Cardiac arrhythmia, unspecified: I49.9

## 2018-01-21 LAB — BASIC METABOLIC PANEL
ANION GAP: 7 (ref 5–15)
BUN: 20 mg/dL (ref 8–23)
CALCIUM: 9 mg/dL (ref 8.9–10.3)
CHLORIDE: 109 mmol/L (ref 98–111)
CO2: 25 mmol/L (ref 22–32)
Creatinine, Ser: 0.77 mg/dL (ref 0.44–1.00)
GFR calc Af Amer: >60 mL/min (ref >60)
GFR calc non Af Amer: >60 mL/min (ref >60)
GLUCOSE: 100 mg/dL — AB (ref 70–99)
Potassium: 3.8 mmol/L (ref 3.5–5.1)
Sodium: 141 mmol/L (ref 135–145)

## 2018-01-21 LAB — CBC WITH DIFFERENTIAL/PLATELET
Abs Immature Granulocytes: 0.01 10*3/uL (ref 0.00–0.07)
Basophils Absolute: 0.1 10*3/uL (ref 0.0–0.1)
Basophils Relative: 1 %
EOS ABS: 0.3 10*3/uL (ref 0.0–0.5)
Eosinophils Relative: 5 %
HCT: 41.9 % (ref 36.0–46.0)
Hemoglobin: 13.7 g/dL (ref 12.0–15.0)
IMMATURE GRANULOCYTES: 0 %
Lymphocytes Relative: 23 %
Lymphs Abs: 1.2 10*3/uL (ref 0.7–4.0)
MCH: 31.1 pg (ref 26.0–34.0)
MCHC: 32.7 g/dL (ref 30.0–36.0)
MCV: 95.2 fL (ref 80.0–100.0)
MONOS PCT: 9 %
Monocytes Absolute: 0.5 10*3/uL (ref 0.1–1.0)
NEUTROS PCT: 62 %
Neutro Abs: 3.2 10*3/uL (ref 1.7–7.7)
Platelets: 186 10*3/uL (ref 150–400)
RBC: 4.4 MIL/uL (ref 3.87–5.11)
RDW: 11.8 % (ref 11.5–15.5)
WBC: 5.2 10*3/uL (ref 4.0–10.5)
nRBC: 0 % (ref 0.0–0.2)

## 2018-01-26 ENCOUNTER — Encounter (HOSPITAL_COMMUNITY)
Admission: RE | Disposition: A | Discharge status: Home / Self Care | Payer: Self-pay | Source: Ambulatory Visit | Attending: General Surgery

## 2018-01-26 ENCOUNTER — Ambulatory Visit (HOSPITAL_COMMUNITY)
Admission: RE | Admit: 2018-01-26 | Discharge: 2018-01-26 | Disposition: A | Discharge status: Home / Self Care | Payer: Medicare Other | Source: Ambulatory Visit | Attending: General Surgery | Admitting: General Surgery

## 2018-01-26 ENCOUNTER — Ambulatory Visit (HOSPITAL_COMMUNITY): Payer: Medicare Other | Admitting: Anesthesiology

## 2018-01-26 DIAGNOSIS — K219 Gastro-esophageal reflux disease without esophagitis: Secondary | ICD-10-CM | POA: Insufficient documentation

## 2018-01-26 DIAGNOSIS — I4891 Unspecified atrial fibrillation: Secondary | ICD-10-CM | POA: Insufficient documentation

## 2018-01-26 DIAGNOSIS — K439 Ventral hernia without obstruction or gangrene: Secondary | ICD-10-CM | POA: Diagnosis present

## 2018-01-26 DIAGNOSIS — Z7982 Long term (current) use of aspirin: Secondary | ICD-10-CM | POA: Diagnosis not present

## 2018-01-26 DIAGNOSIS — Z79899 Other long term (current) drug therapy: Secondary | ICD-10-CM | POA: Insufficient documentation

## 2018-01-26 DIAGNOSIS — K436 Other and unspecified ventral hernia with obstruction, without gangrene: Secondary | ICD-10-CM | POA: Diagnosis present

## 2018-01-26 DIAGNOSIS — M1990 Unspecified osteoarthritis, unspecified site: Secondary | ICD-10-CM | POA: Diagnosis not present

## 2018-01-26 DIAGNOSIS — Z823 Family history of stroke: Secondary | ICD-10-CM | POA: Insufficient documentation

## 2018-01-26 HISTORY — PX: VENTRAL HERNIA REPAIR: SHX424

## 2018-01-26 SURGERY — REPAIR, HERNIA, VENTRAL
Anesthesia: General | Site: Abdomen

## 2018-01-26 MED ORDER — FENTANYL CITRATE (PF) 100 MCG/2ML IJ SOLN
INTRAMUSCULAR | Status: AC
  Filled 2018-01-26: qty 2

## 2018-01-26 MED ORDER — EPHEDRINE SULFATE 50 MG/ML IJ SOLN
INTRAMUSCULAR | Status: DC | PRN
  Administered 2018-01-26: 10 mg via INTRAVENOUS

## 2018-01-26 MED ORDER — HYDROMORPHONE HCL 1 MG/ML IJ SOLN
0.2500 mg | INTRAMUSCULAR | Status: DC | PRN
  Administered 2018-01-26: 0.5 mg via INTRAVENOUS
  Filled 2018-01-26: qty 0.5

## 2018-01-26 MED ORDER — FENTANYL CITRATE (PF) 100 MCG/2ML IJ SOLN
INTRAMUSCULAR | Status: DC | PRN
  Administered 2018-01-26 (×2): 50 ug via INTRAVENOUS

## 2018-01-26 MED ORDER — MIDAZOLAM HCL 5 MG/5ML IJ SOLN
INTRAMUSCULAR | Status: DC | PRN
  Administered 2018-01-26: 2 mg via INTRAVENOUS

## 2018-01-26 MED ORDER — CEFAZOLIN SODIUM-DEXTROSE 2-4 GM/100ML-% IV SOLN
INTRAVENOUS | Status: AC
  Filled 2018-01-26: qty 100

## 2018-01-26 MED ORDER — PROPOFOL 10 MG/ML IV BOLUS
INTRAVENOUS | Status: DC | PRN
  Administered 2018-01-26: 160 mg via INTRAVENOUS

## 2018-01-26 MED ORDER — LIDOCAINE HCL 1 % IJ SOLN
INTRAMUSCULAR | Status: DC | PRN
  Administered 2018-01-26: 50 mg via INTRADERMAL

## 2018-01-26 MED ORDER — KETOROLAC TROMETHAMINE 30 MG/ML IJ SOLN
30.0000 mg | Freq: Once | INTRAMUSCULAR | Status: AC
  Administered 2018-01-26: 30 mg via INTRAVENOUS
  Filled 2018-01-26: qty 1

## 2018-01-26 MED ORDER — EPHEDRINE 5 MG/ML INJ
INTRAVENOUS | Status: AC
  Filled 2018-01-26: qty 10

## 2018-01-26 MED ORDER — CHLORHEXIDINE GLUCONATE CLOTH 2 % EX PADS: 6.0000 | MEDICATED_PAD | Freq: Once | CUTANEOUS | Status: DC

## 2018-01-26 MED ORDER — BUPIVACAINE LIPOSOME 1.3 % IJ SUSP
INTRAMUSCULAR | Status: DC | PRN
  Administered 2018-01-26: 20 mL

## 2018-01-26 MED ORDER — ONDANSETRON HCL 4 MG/2ML IJ SOLN
INTRAMUSCULAR | Status: DC | PRN
  Administered 2018-01-26: 4 mg via INTRAVENOUS

## 2018-01-26 MED ORDER — SODIUM CHLORIDE 0.9% FLUSH
INTRAVENOUS | Status: AC
  Filled 2018-01-26: qty 20

## 2018-01-26 MED ORDER — MEPERIDINE HCL 50 MG/ML IJ SOLN: 6.2500 mg | INTRAMUSCULAR | Status: DC | PRN

## 2018-01-26 MED ORDER — LACTATED RINGERS IV SOLN
INTRAVENOUS | Status: DC
  Administered 2018-01-26: 11:00:00 via INTRAVENOUS

## 2018-01-26 MED ORDER — DOCUSATE SODIUM 100 MG PO CAPS: 100.0000 mg | ORAL_CAPSULE | Freq: Two times a day (BID) | ORAL | 2 refills | Status: DC

## 2018-01-26 MED ORDER — BUPIVACAINE LIPOSOME 1.3 % IJ SUSP
INTRAMUSCULAR | Status: AC
  Filled 2018-01-26: qty 20

## 2018-01-26 MED ORDER — SEVOFLURANE IN SOLN
RESPIRATORY_TRACT | Status: AC
  Filled 2018-01-26: qty 250

## 2018-01-26 MED ORDER — HYDROCODONE-ACETAMINOPHEN 7.5-325 MG PO TABS: 1.0000 | ORAL_TABLET | Freq: Once | ORAL | Status: DC | PRN

## 2018-01-26 MED ORDER — ONDANSETRON HCL 4 MG/2ML IJ SOLN
INTRAMUSCULAR | Status: AC
  Filled 2018-01-26: qty 2

## 2018-01-26 MED ORDER — OXYCODONE HCL 5 MG PO TABS: 5.0000 mg | ORAL_TABLET | ORAL | 0 refills | Status: DC | PRN

## 2018-01-26 MED ORDER — CEFAZOLIN SODIUM-DEXTROSE 2-4 GM/100ML-% IV SOLN
2.0000 g | INTRAVENOUS | Status: AC
  Administered 2018-01-26: 2 g via INTRAVENOUS

## 2018-01-26 MED ORDER — LACTATED RINGERS IV SOLN
INTRAVENOUS | Status: DC
  Administered 2018-01-26: 09:00:00 via INTRAVENOUS

## 2018-01-26 MED ORDER — 0.9 % SODIUM CHLORIDE (POUR BTL) OPTIME
TOPICAL | Status: DC | PRN
  Administered 2018-01-26: 1000 mL

## 2018-01-26 MED ORDER — MIDAZOLAM HCL 2 MG/2ML IJ SOLN
INTRAMUSCULAR | Status: AC
  Filled 2018-01-26: qty 2

## 2018-01-26 MED ORDER — PROMETHAZINE HCL 25 MG/ML IJ SOLN
6.2500 mg | INTRAMUSCULAR | Status: DC | PRN
  Administered 2018-01-26: 12.5 mg via INTRAVENOUS
  Filled 2018-01-26: qty 1

## 2018-01-26 SURGICAL SUPPLY — 30 items
CHLORAPREP W/TINT 26ML (MISCELLANEOUS) ×3 IMPLANT
CLOTH BEACON ORANGE TIMEOUT ST (SAFETY) ×3 IMPLANT
COVER LIGHT HANDLE STERIS (MISCELLANEOUS) ×6 IMPLANT
DRSG OPSITE POSTOP 4X10 (GAUZE/BANDAGES/DRESSINGS) ×3 IMPLANT
ELECT REM PT RETURN 9FT ADLT (ELECTROSURGICAL) ×3
ELECTRODE REM PT RTRN 9FT ADLT (ELECTROSURGICAL) ×1 IMPLANT
GAUZE SPONGE 4X4 12PLY STRL (GAUZE/BANDAGES/DRESSINGS) ×3 IMPLANT
GLOVE BIO SURGEON STRL SZ 6.5 (GLOVE) ×2 IMPLANT
GLOVE BIO SURGEONS STRL SZ 6.5 (GLOVE) ×1
GLOVE BIOGEL PI IND STRL 6.5 (GLOVE) ×2 IMPLANT
GLOVE BIOGEL PI IND STRL 7.0 (GLOVE) ×2 IMPLANT
GLOVE BIOGEL PI INDICATOR 6.5 (GLOVE) ×4
GLOVE BIOGEL PI INDICATOR 7.0 (GLOVE) ×4
GLOVE SURG SS PI 6.5 STRL IVOR (GLOVE) ×3 IMPLANT
GLOVE SURG SS PI 7.5 STRL IVOR (GLOVE) ×3 IMPLANT
GOWN STRL REUS W/TWL LRG LVL3 (GOWN DISPOSABLE) ×6 IMPLANT
INST SET MAJOR GENERAL (KITS) ×3 IMPLANT
KIT TURNOVER KIT A (KITS) ×3 IMPLANT
LIGASURE IMPACT 36 18CM CVD LR (INSTRUMENTS) ×3 IMPLANT
MANIFOLD NEPTUNE II (INSTRUMENTS) ×3 IMPLANT
MESH VENTRALEX ST 8CM LRG (Mesh General) ×3 IMPLANT
NEEDLE HYPO 25X1 1.5 SAFETY (NEEDLE) ×3 IMPLANT
NS IRRIG 1000ML POUR BTL (IV SOLUTION) ×3 IMPLANT
PACK ABDOMINAL MAJOR (CUSTOM PROCEDURE TRAY) ×3 IMPLANT
PAD ARMBOARD 7.5X6 YLW CONV (MISCELLANEOUS) ×3 IMPLANT
SET BASIN LINEN APH (SET/KITS/TRAYS/PACK) ×3 IMPLANT
STAPLER VISISTAT (STAPLE) ×3 IMPLANT
SUT ETHIBOND NAB MO 7 #0 18IN (SUTURE) ×6 IMPLANT
SUT VIC AB 2-0 CT2 27 (SUTURE) ×9 IMPLANT
SYR 20CC LL (SYRINGE) ×3 IMPLANT

## 2018-01-26 NOTE — Interval H&P Note (Signed)
History and Physical Interval Note:  01/26/2018 8:29 AM  Amanda Mckay  has presented today for surgery, with the diagnosis of ventral hernia  The various methods of treatment have been discussed with the patient and family. After consideration of risks, benefits and other options for treatment, the patient has consented to  Procedure(s): HERNIA REPAIR VENTRAL ADULT WITH MESH (N/A) as a surgical intervention .  The patient's history has been reviewed, patient examined, no change in status, stable for surgery.  I have reviewed the patient's chart and labs.  Questions were answered to the patient's satisfaction.    No major changes. No questions.  Discussed with family.  Virl Cagey

## 2018-01-26 NOTE — Transfer of Care (Signed)
Immediate Anesthesia Transfer of Care Note  Patient: Amanda Mckay  Procedure(s) Performed: HERNIA REPAIR VENTRAL ADULT WITH MESH (N/A Abdomen)  Patient Location: PACU  Anesthesia Type:General  Level of Consciousness: awake and patient cooperative  Airway & Oxygen Therapy: Patient Spontanous Breathing  Post-op Assessment: Report given to RN, Post -op Vital signs reviewed and stable and Patient moving all extremities  Post vital signs: Reviewed and stable  Last Vitals:  Vitals Value Taken Time  BP    Temp    Pulse 71 01/26/2018 10:31 AM  Resp    SpO2 93 % 01/26/2018 10:31 AM  Vitals shown include unvalidated device data.  Last Pain:  Vitals:   01/26/18 0829  TempSrc: Oral  PainSc: 0-No pain      Patients Stated Pain Goal: 5 ([id number])  Complications: No apparent anesthesia complications

## 2018-01-26 NOTE — Anesthesia Postprocedure Evaluation (Signed)
Anesthesia Post Note  Patient: Amanda Mckay  Procedure(s) Performed: HERNIA REPAIR VENTRAL ADULT WITH MESH (N/A )  Patient location during evaluation: PACU Anesthesia Type: General Level of consciousness: awake and patient cooperative Pain management: pain level controlled Vital Signs Assessment: post-procedure vital signs reviewed and stable Respiratory status: spontaneous breathing, nonlabored ventilation and respiratory function stable Cardiovascular status: blood pressure returned to baseline Postop Assessment: no apparent nausea or vomiting Anesthetic complications: no     Last Vitals:  Vitals:   01/26/18 0829  BP: 126/74  Pulse: 83  Resp: 20  Temp: 36.8 C  SpO2: 97%    Last Pain:  Vitals:   01/26/18 0829  TempSrc: Oral  PainSc: 0-No pain                 Marky Buresh J

## 2018-01-26 NOTE — Discharge Instructions (Signed)
Discharge Instructions: Leave the dressing on until 11/13. You can then remove it and start to shower. Expect some drainage from the incision. Shower per your regular routine. You can cover the incision with a clean dry dressing /gauze daily as desired.  Take tylenol and ibuprofen as needed for pain control, alternating every 4-6 hours.  Take Roxicodone for breakthrough pain. Take colace for constipation related to narcotic pain medication. Do not pick at the staples.  Where your abdominal binder with activity. You can keep it off when resting/ sitting or sleeping. No heavy lifting > 10 lbs, no excessive squatting, bending, pushing, pulling for the next 6-8 weeks.  Roll on your side when getting in and out of bed to prevent strain on your abdomen.     Open Hernia Repair, Adult, Care After These instructions give you information about caring for yourself after your procedure. Your doctor may also give you more specific instructions. If you have problems or questions, contact your doctor. Follow these instructions at home: Surgical cut (incision) care   Follow instructions from your doctor about how to take care of your surgical cut area. Make sure you: ? Wash your hands with soap and water before you change your bandage (dressing). If you cannot use soap and water, use hand sanitizer. ? Change your bandage as told by your doctor. ? Leave stitches (sutures), skin glue, or skin tape (adhesive) strips in place. They may need to stay in place for 2 weeks or longer. If tape strips get loose and curl up, you may trim the loose edges. Do not remove tape strips completely unless your doctor says it is okay.  Check your surgical cut every day for signs of infection. Check for: ? More redness, swelling, or pain. ? More fluid or blood. ? Warmth. ? Pus or a bad smell. Activity  Do not drive or use heavy machinery while taking prescription pain medicine. Do not drive until your doctor says it is  okay.  Until your doctor says it is okay: ? Do not lift anything that is heavier than 10 lb (4.5 kg). ? Do not play contact sports.  Return to your normal activities as told by your doctor. Ask your doctor what activities are safe. General instructions  To prevent or treat having a hard time pooping (constipation) while you are taking prescription pain medicine, your doctor may recommend that you: ? Drink enough fluid to keep your pee (urine) clear or pale yellow. ? Take over-the-counter or prescription medicines. ? Eat foods that are high in fiber, such as fresh fruits and vegetables, whole grains, and beans. ? Limit foods that are high in fat and processed sugars, such as fried and sweet foods.  Take over-the-counter and prescription medicines only as told by your doctor.  Do not take baths, swim, or use a hot tub until your doctor says it is okay.  Keep all follow-up visits as told by your doctor. This is important. Contact a doctor if:  You develop a rash.  You have more redness, swelling, or pain around your surgical cut.  You have more fluid or blood coming from your surgical cut.  Your surgical cut feels warm to the touch.  You have pus or a bad smell coming from your surgical cut.  You have a fever or chills.  You have blood in your poop (stool).  You have not pooped in 2-3 days.  Medicine does not help your pain. Get help right away if:  You  have chest pain or you are short of breath.  You feel light-headed.  You feel weak and dizzy (feel faint).  You have very bad pain.  You throw up (vomit) and your pain is worse. This information is not intended to replace advice given to you by your health care provider. Make sure you discuss any questions you have with your health care provider. Document Released: 03/25/2014 Document Revised: 09/22/2015 Document Reviewed: 08/16/2015 Elsevier Interactive Patient Education  [employer].

## 2018-01-26 NOTE — Anesthesia Preprocedure Evaluation (Signed)
Anesthesia Evaluation    Airway Mallampati: II       Dental  (+) Teeth Intact, Dental Advidsory Given   Pulmonary    breath sounds clear to auscultation       Cardiovascular + dysrhythmias Atrial Fibrillation  Rhythm:irregular     Neuro/Psych    GI/Hepatic GERD  Medicated,  Endo/Other  Morbid obesity  Renal/GU      Musculoskeletal   Abdominal   Peds  Hematology   Anesthesia Other Findings   Reproductive/Obstetrics                             Anesthesia Physical Anesthesia Plan  ASA: III  Anesthesia Plan: General   Post-op Pain Management:    Induction:   PONV Risk Score and Plan:   Airway Management Planned:   Additional Equipment:   Intra-op Plan:   Post-operative Plan:   Informed Consent:   Plan Discussed with: Anesthesiologist  Anesthesia Plan Comments:         Anesthesia Quick Evaluation

## 2018-01-26 NOTE — Op Note (Signed)
[facility] Operative Note  01/26/18  Preoperative Diagnosis:  Incarcerated Ventral hernia    Postoperative Diagnosis: Same   Procedure(s) Performed: Ventral hernia repair with mesh (Ventralex 8cm)   Surgeon: Lanell Matar. Constance Haw, MD   Assistants: Aviva Signs, MD     Anesthesia: General endotracheal   Anesthesiologist: Dr. Herbie Drape     Specimens: None    Estimated Blood Loss: Minimal   Blood Replacement: None    Complications: None   Wound Class: Clean    Operative Indications: Amanda Mckay is a 72 yo who has had a hernia for multiple years that has bee getting worse overtime. She has dealt with it, but now feels that it is getting larger and more uncomfortable. The skin over the hernia is also very thinned and is getting thinner. She now has opted to have this repaired. We discussed the risk and benefits of surgery including but not limited to bleeding, infection, use of mesh, risk of recurrence.   Findings: Omentum in ventral hernia, small mouth 2  Cm    Procedure: The patient was taken to the operating room and placed supine. General endotracheal anesthesia was induced. Intravenous antibiotics were  administered per protocol.  An orogastric tube positioned to decompress the stomach. The abdomen was prepared and draped in the usual sterile fashion.   An incision was made around the protruding hernia and carried down into the subcutaneous tissue with cautery.  The mouth of the hernia defect was small at about 2 cm, and we were able to get around the hernia sac completely to define the fascial defect.  The sac was then opened, and noted to only contain omentum. This omentum was chronically incarcerated and adherent to the hernia sac so it was ligated with a Ligasure device.  The remaining omentum was placed into the defect, and the fascia was palpated intraabdominal and no additional defects were noted.  The fascia was very thinned and attenuated.  Given the  fascial defect with the attenuation, a 8cm Ventralex hernia patch was placed intraabdominally and secured with 0 Ethibond sutures.  The hernia defect was then closed primarily with 0 Ethibond suture in an interrupted fashion.    The skin overlying the hernia was thinned and was going to necrosis. This was excised. The edges of the incision was undermined slightly to allow for laxity to bring the edges to the midline.  Subcutaneous and deep dermal 2- Vicryl interrupted sutures were placed to close the space and bring together the skin edges.  The skin was then closed with staples.  A sterile honeycomb dressing was applied.   Dr. Arnoldo Mckay was assisting throughout the procedure and was present for the critical portions of the case.   Final inspection revealed acceptable hemostasis. All counts were correct at the end of the case. The patient was awakened from anesthesia and extubate without complication.  The patient went to the PACU in stable condition.   Amanda Labrum, MD [facility] [address] [place], [place] [address] [phone] (office)

## 2018-01-26 NOTE — Anesthesia Procedure Notes (Signed)
Procedure Name: LMA Insertion Date/Time: 01/26/2018 9:24 AM Performed by: Charmaine Downs, CRNA Pre-anesthesia Checklist: Patient identified, Patient being monitored, Emergency Drugs available, Timeout performed and Suction available Patient Re-evaluated:Patient Re-evaluated prior to induction Oxygen Delivery Method: Circle System Utilized Preoxygenation: Pre-oxygenation with 100% oxygen Induction Type: IV induction Ventilation: Mask ventilation without difficulty LMA: LMA inserted LMA Size: 4.0 Number of attempts: 1 Placement Confirmation: positive ETCO2 and breath sounds checked- equal and bilateral Tube secured with: Tape Dental Injury: Teeth and Oropharynx as per pre-operative assessment

## 2018-01-26 NOTE — Anesthesia Postprocedure Evaluation (Signed)
Anesthesia Post Note  Patient: Amanda Mckay  Procedure(s) Performed: HERNIA REPAIR VENTRAL ADULT WITH MESH (N/A Abdomen)  Patient location during evaluation: PACU Anesthesia Type: General Level of consciousness: awake and patient cooperative Pain management: pain level controlled Vital Signs Assessment: post-procedure vital signs reviewed and stable Respiratory status: spontaneous breathing, nonlabored ventilation and respiratory function stable Cardiovascular status: blood pressure returned to baseline Postop Assessment: no apparent nausea or vomiting Anesthetic complications: no     Last Vitals:  Vitals:   01/26/18 0829 01/26/18 1032  BP: 126/74   Pulse: 83   Resp: 20   Temp: 36.8 C (P) 36.6 C  SpO2: 97%     Last Pain:  Vitals:   01/26/18 0829  TempSrc: Oral  PainSc: 0-No pain                 Libi Corso J

## 2018-01-27 ENCOUNTER — Encounter (HOSPITAL_COMMUNITY): Payer: Self-pay | Admitting: General Surgery

## 2018-01-29 ENCOUNTER — Telehealth: Payer: Self-pay | Admitting: Emergency Medicine

## 2018-01-29 NOTE — Telephone Encounter (Signed)
Patient called and stated she has not had a bowel movement in 3 days and she is having abdominal pain, She also stated she has not taken any pain medication.

## 2018-02-10 ENCOUNTER — Ambulatory Visit (INDEPENDENT_AMBULATORY_CARE_PROVIDER_SITE_OTHER): Payer: Self-pay | Admitting: General Surgery

## 2018-02-10 ENCOUNTER — Encounter: Payer: Self-pay | Admitting: General Surgery

## 2018-02-10 VITALS — BP 127/85 | HR 87 | Temp 97.5°F | Resp 18 | Wt 240.6 lb

## 2018-02-10 DIAGNOSIS — K439 Ventral hernia without obstruction or gangrene: Secondary | ICD-10-CM

## 2018-02-10 NOTE — Patient Instructions (Addendum)
Do not lift > 10 lbs, perform excessive bending, pushing, pulling, squatting for 6-8 weeks after surgery.  The activity restrictions and the abdominal binder are to help you heal and prevent recurrence of your hernia.  Do not place lotions or balms on your incision. Bacitracin is ok to put on the incision. The steristrips will fall off in a few days, you can remove once starting to peel.

## 2018-02-10 NOTE — Progress Notes (Signed)
[facility] Note   HPI:  72 y.o. Female presents to clinic for post-op follow-up evaluation after open hernia repair with mesh. She had some issues with constipation and had issues getting in touch with the office immediately post op. She took some fleets enema and this improved. She is now doing great. She never took any narcotics.   Review of Systems:  No pain No fevers or chills  All other review of systems: otherwise negative   Vital Signs:  BP 127/85 (BP Location: Left Arm, Patient Position: Sitting, Cuff Size: Large)   Pulse 87   Temp (!) 97.5 F (36.4 C)   Resp 18   Wt 240 lb 9.6 oz (109.1 kg)   BMI 38.83 kg/m    Physical Exam:  Physical Exam  Cardiovascular: Normal rate.  Pulmonary/Chest: Effort normal.  Abdominal: Soft. She exhibits no distension.  Healed incision, staple removed, minor irritation from the staples but no extending erythema or drainage, some induration    Assessment:   72 y.o. yo Female s/p open hernia repair with mesh. Doing well.  Plan:  Do not lift > 10 lbs, perform excessive bending, pushing, pulling, squatting for 6-8 weeks after surgery.  The activity restrictions and the abdominal binder are to help you heal and prevent recurrence of your hernia.  Do not place lotions or balms on your incision. Bacitracin is ok to put on the incision. The steristrips will fall off in a few days, you can remove once starting to peel.  Follow up PRN  All of the above recommendations were discussed with the patient, and all of patient's questions were answered to her expressed satisfaction.  Curlene Labrum, MD [facility] [address] [place], [place] [address] [phone] (office)

## 2018-03-10 ENCOUNTER — Telehealth: Payer: Self-pay | Admitting: Emergency Medicine

## 2018-03-10 NOTE — Telephone Encounter (Signed)
erro  neous encounter

## 2018-03-16 ENCOUNTER — Other Ambulatory Visit: Payer: Self-pay | Admitting: Cardiovascular Disease

## 2018-05-13 ENCOUNTER — Encounter: Payer: Self-pay | Admitting: Orthopedic Surgery

## 2018-05-13 ENCOUNTER — Ambulatory Visit (INDEPENDENT_AMBULATORY_CARE_PROVIDER_SITE_OTHER): Payer: Medicare Other

## 2018-05-13 ENCOUNTER — Ambulatory Visit: Payer: Medicare Other | Admitting: Orthopedic Surgery

## 2018-05-13 VITALS — BP 111/64 | HR 86 | Ht 62.0 in | Wt 236.0 lb

## 2018-05-13 DIAGNOSIS — M1710 Unilateral primary osteoarthritis, unspecified knee: Secondary | ICD-10-CM

## 2018-05-13 DIAGNOSIS — M25561 Pain in right knee: Secondary | ICD-10-CM

## 2018-05-13 DIAGNOSIS — M1711 Unilateral primary osteoarthritis, right knee: Secondary | ICD-10-CM

## 2018-05-13 DIAGNOSIS — G8929 Other chronic pain: Secondary | ICD-10-CM

## 2018-05-13 MED ORDER — DICLOFENAC POTASSIUM 50 MG PO TABS: 50.0000 mg | ORAL_TABLET | Freq: Two times a day (BID) | ORAL | 3 refills | Status: DC

## 2018-05-13 NOTE — Progress Notes (Addendum)
[facility] NEW PATIENT OFFICE VISIT  Chief Complaint  Patient presents with  . Knee Pain    Right knee pain, no injury.    73 year old female presents with history of osteoarthritis of the left knee and progressive arthritis over the last month in the right knee with diffuse pain which localizes to the medial joint line.  She has associated some locking and catching occasionally but no swelling.  When she is having severe pain is a 10 out of 10 it can be dull achy sharp and burning.  She denies any trauma  She does note use of a walking aid x1 month with prior treatment over-the-counter Aspercreme and Tylenol which is not effective     Review of Systems  Cardiovascular:       History of atrial fibrillation no symptoms  Musculoskeletal: Positive for joint pain.  All other systems reviewed and are negative.    Past Medical History:  Diagnosis Date  . Arthritis   . Atrial fibrillation ([place])   . Cataract   . Dysrhythmia    AFib  . GERD (gastroesophageal reflux disease)   . Hemochromatosis   . Iron excess     Past Surgical History:  Procedure Laterality Date  . CESAREAN SECTION    . TONSILLECTOMY    . VENTRAL HERNIA REPAIR N/A 01/26/2018   Procedure: HERNIA REPAIR VENTRAL ADULT WITH MESH;  Surgeon: Virl Cagey, MD;  Location: [facility];  Service: General;  Laterality: N/A;    Family History  Problem Relation Age of Onset  . Stroke Mother   . Atrial fibrillation Son   . Stroke Maternal Grandmother   . Diabetes Paternal Grandfather   . Arthritis Brother    Social History   Tobacco Use  . Smoking status: Never Smoker  . Smokeless tobacco: Never Used  Substance Use Topics  . Alcohol use: Yes    Alcohol/week: 2.0 standard drinks    Types: 2 Glasses of wine per week  . Drug use: No    Allergies  Allergen Reactions  . Tetanus Toxoids Other (See Comments)    Reaction occurred more than 40 yrs ago--does not remember what occurred.    Current Meds  Medication  Sig  . acetaminophen (TYLENOL) 325 MG tablet Take 650 mg by mouth 2 (two) times daily as needed for moderate pain or headache.   [name] aspirin 325 MG tablet Take 325 mg by mouth every evening.   . Calcium Carb-Cholecalciferol (CALCIUM 600+D3 PO) Take 1 tablet by mouth 2 (two) times daily.  . Cholecalciferol (D3-1000) 1000 units capsule Take 1,000 Units by mouth daily.  . famotidine (PEPCID) 20 MG tablet Take 20 mg by mouth 2 (two) times daily as needed for heartburn or indigestion.   . flecainide (TAMBOCOR) 100 MG tablet TAKE ONE-HALF TABLET BY MOUTH TWO TIMES DAILY.  . metoprolol tartrate (LOPRESSOR) 25 MG tablet TAKE ONE TABLET BY MOUTH TWICE A DAY  . trolamine salicylate (ASPERCREME) 10 % cream Apply 1 application topically daily as needed for muscle pain.    BP 111/64   Pulse 86   Ht 5\' 2"  (1.575 m)   Wt 236 lb (107 kg)   BMI 43.16 kg/m   Physical Exam Vitals signs reviewed.  Constitutional:      Appearance: Normal appearance. She is well-developed.  Musculoskeletal:     Right knee: She exhibits no effusion.     Left knee: She exhibits no effusion.  Neurological:     Mental Status: She is alert  and oriented to person, place, and time.  Psychiatric:        Attention and Perception: Attention normal.        Mood and Affect: Mood and affect normal.        Speech: Speech normal.        Behavior: Behavior normal.        Thought Content: Thought content normal.        Judgment: Judgment normal.     Right Knee Exam   Muscle Strength  The patient has normal right knee strength.  Tenderness  The patient is experiencing tenderness in the medial joint line.  Range of Motion  Extension:  5 normal  Flexion:  120 normal   Tests  McMurray:  Medial - negative Lateral - negative Varus: negative Valgus: negative Drawer:  Anterior - negative    Posterior - negative  Other  Erythema: absent Scars: absent Sensation: normal Pulse: present Swelling: none Effusion: no effusion  present   Left Knee Exam   Muscle Strength  The patient has normal left knee strength.  Tenderness  The patient is experiencing tenderness in the medial joint line.  Range of Motion  Extension:  5 normal  Flexion:  90 normal   Tests  McMurray:  Medial - negative Lateral - negative Varus: negative Valgus: negative Drawer:  Anterior - negative     Posterior - negative  Other  Erythema: absent Scars: absent Sensation: normal Pulse: present Swelling: none Effusion: no effusion present        MEDICAL DECISION SECTION  Xrays were done at [facility]  My independent reading of xrays:  See separate dictation varus knee with moderate arthritis mild secondary bone changes  Encounter Diagnoses  Name Primary?  . Chronic pain of right knee   . Primary localized osteoarthritis of knee Yes    PLAN: (Rx., injectx, surgery, frx, mri/ct) Injection  Procedure note right knee injection verbal consent was obtained to inject right knee joint  Timeout was completed to confirm the site of injection  The medications used were 40 mg of Depo-Medrol and 1% lidocaine 3 cc  Anesthesia was provided by ethyl chloride and the skin was prepped with alcohol.  After cleaning the skin with alcohol a 20-gauge needle was used to inject the right knee joint. There were no complications. A sterile bandage was applied.  Start arthritis medication OA brace FU 6 MONTHS   Meds ordered this encounter  Medications  . diclofenac (CATAFLAM) 50 MG tablet    Sig: Take 1 tablet (50 mg total) by mouth 2 (two) times daily.    Dispense:  90 tablet    Refill:  3    [name], MD  05/13/2018 10:08 AM

## 2018-05-13 NOTE — Patient Instructions (Signed)
Start arthritis medicine 50 mg diclofenac twice a day  Knee exercises daily  Wear brace for strenuous activity

## 2018-08-13 ENCOUNTER — Ambulatory Visit: Payer: Medicare Other | Admitting: Physician Assistant

## 2018-08-13 ENCOUNTER — Ambulatory Visit: Payer: Medicare Other | Admitting: Family Medicine

## 2018-08-19 ENCOUNTER — Other Ambulatory Visit: Payer: Self-pay | Admitting: Cardiovascular Disease

## 2018-11-02 ENCOUNTER — Other Ambulatory Visit: Payer: Self-pay | Admitting: Orthopedic Surgery

## 2018-11-02 DIAGNOSIS — G8929 Other chronic pain: Secondary | ICD-10-CM

## 2018-11-11 ENCOUNTER — Telehealth: Payer: Self-pay | Admitting: Radiology

## 2018-11-11 ENCOUNTER — Encounter: Payer: Self-pay | Admitting: Orthopedic Surgery

## 2018-11-11 ENCOUNTER — Telehealth: Payer: Self-pay

## 2018-11-11 ENCOUNTER — Other Ambulatory Visit: Payer: Self-pay

## 2018-11-11 ENCOUNTER — Ambulatory Visit (INDEPENDENT_AMBULATORY_CARE_PROVIDER_SITE_OTHER): Payer: Medicare Other | Admitting: Orthopedic Surgery

## 2018-11-11 VITALS — BP 152/65 | HR 81 | Temp 97.3°F | Ht 62.0 in | Wt 240.0 lb

## 2018-11-11 DIAGNOSIS — M1710 Unilateral primary osteoarthritis, unspecified knee: Secondary | ICD-10-CM

## 2018-11-11 DIAGNOSIS — S83241D Other tear of medial meniscus, current injury, right knee, subsequent encounter: Secondary | ICD-10-CM

## 2018-11-11 NOTE — Telephone Encounter (Signed)
Noted,

## 2018-11-11 NOTE — Patient Instructions (Signed)
You have been scheduled for an MRI scan We will call your insurance company to do a precertification to get the MRI covered You will receive a phone call regarding the date of the scan 

## 2018-11-11 NOTE — Telephone Encounter (Signed)
I called patient she should expect a call from [facility] to schedule the MRI scan.

## 2018-11-11 NOTE — Addendum Note (Signed)
Addended byCandice Camp on: 11/11/2018 11:52 AM   Modules accepted: Orders

## 2018-11-11 NOTE — Progress Notes (Signed)
Progress Note   Patient ID: Amanda Mckay, female   DOB: 11-22-1945, 73 y.o.   MRN: [id number]   Chief Complaint  Patient presents with  . Knee Pain    right     Encounter Diagnoses  Name Primary?  . Primary localized osteoarthritis of knee Yes  . Acute medial meniscus tear of right knee, subsequent encounter     73 year old female presents back with continued pain right knee problem  She was treated with Cataflam knee injection knee brace.  She says her pain is under control but she is having frequent giving way symptoms and trouble with side to side and pivoting maneuvers  73 year old female presents with history of osteoarthritis of the left knee and progressive arthritis over the last month in the right knee with diffuse pain which localizes to the medial joint line.  She has associated some locking and catching occasionally but no swelling.  When she is having severe pain is a 10 out of 10 it can be dull achy sharp and burning.   She denies any trauma   She does note use of a walking aid x1 month with prior treatment over-the-counter Aspercreme and Tylenol which is not effective  No change from 2/26 Review of Systems  Cardiovascular:       History of atrial fibrillation no symptoms  Musculoskeletal: Positive for joint pain.  All other systems reviewed and are negative.     BP (!) 152/65   Pulse 81   Temp (!) 97.3 F (36.3 C)   Ht 5\' 2"  (1.575 m)   Wt 240 lb (108.9 kg)   BMI 43.90 kg/m   Physical Exam Vitals signs and nursing note reviewed.  Constitutional:      Appearance: Normal appearance.  Musculoskeletal:     Comments: [place] still requiring the use of a cane.  She does have medial joint line tenderness and pain with rotation of the knee and McMurray's maneuver her range of motion is somewhat diminished and she still has a limp with positive McMurray sign  Neurological:     Mental Status: She is alert and oriented to person, place, and time.  Psychiatric:         Mood and Affect: Mood normal.      Medical decisions:  (Established problem worse, x-ray ,physical therapy, over-the-counter medicines, read outside film or summarize x-ray)  Data  Imaging:   Prior x-ray shows varus osteoarthritis  Encounter Diagnoses  Name Primary?  . Primary localized osteoarthritis of knee Yes  . Acute medial meniscus tear of right knee, subsequent encounter     PLAN:   Recommend MRI right knee  Call patient with results  Prepare for surgery    [name], MD 11/11/2018 9:09 AM

## 2018-11-11 NOTE — Telephone Encounter (Signed)
Patient left message on vm that she wanted to change her MRI site from [facility] to [facility] [place].  Please call and advise  [phone]

## 2018-11-17 ENCOUNTER — Encounter: Payer: Self-pay | Admitting: Family Medicine

## 2018-11-17 ENCOUNTER — Ambulatory Visit (INDEPENDENT_AMBULATORY_CARE_PROVIDER_SITE_OTHER): Payer: Medicare Other | Admitting: Family Medicine

## 2018-11-17 ENCOUNTER — Other Ambulatory Visit: Payer: Self-pay

## 2018-11-17 ENCOUNTER — Ambulatory Visit (HOSPITAL_COMMUNITY): Payer: Medicare Other

## 2018-11-17 DIAGNOSIS — R6889 Other general symptoms and signs: Secondary | ICD-10-CM | POA: Diagnosis not present

## 2018-11-17 DIAGNOSIS — R21 Rash and other nonspecific skin eruption: Secondary | ICD-10-CM | POA: Diagnosis not present

## 2018-11-17 DIAGNOSIS — Z20822 Contact with and (suspected) exposure to covid-19: Secondary | ICD-10-CM

## 2018-11-17 DIAGNOSIS — B349 Viral infection, unspecified: Secondary | ICD-10-CM | POA: Diagnosis not present

## 2018-11-17 NOTE — Progress Notes (Signed)
Virtual Visit via Telephone Note  I connected with Amanda Mckay on 11/17/18 at 9:29am by telephone and verified that I am speaking with the correct person using two identifiers.      Pt location: at home   Physician location:  In office, [facility], Vic Blackbird MD     On call: patient and physician   I discussed the limitations, risks, security and privacy concerns of performing an evaluation and management service by telephone and the availability of in person appointments. I also discussed with the patient that there may be a patient responsible charge related to this service. The patient expressed understanding and agreed to proceed.   History of Present Illness:  This past Sunday, she felt chills before bedtime, around 1am had severe chills and multiple episodes of vomiting, then dry heaves. Was able to get back to bed, but felt fatigued past 2 days. She was able to eat small amount yesterday. Last night started again with child around 4:30am. She had headache with it and took 2 tylenol. She had a few episodes of loose stools.  Occ mild hacking cough, no nasal congestion, sore throat , no SOB Feels okay right now. No recent rave Has had temp 98.22F last night She has been quarentine  she has also noticed some redness with peeling between toes,was concerned about covid toes, no black or blue toes, normal feeling except itching between toes  Observations/Objective: NAD noted over the phone  Assessment and Plan: Viral illness- improved nausea, able to tolerate fluids, but still with age , chills, aches will swab for covid, treat symptoms with OTC meds  Unable to visualize feet, sound more fungal, treat with topical anti-fungal and cortisone for itching   Follow Up Instructions:    I discussed the assessment and treatment plan with the patient. The patient was provided an opportunity to ask questions and all were answered. The patient agreed with the plan and  demonstrated an understanding of the instructions.   The patient was advised to call back or seek an in-person evaluation if the symptoms worsen or if the condition fails to improve as anticipated.  I provided  11 minutes of non-face-to-face time during this encounter. End time : 9:40am  Vic Blackbird, MD

## 2018-11-18 ENCOUNTER — Other Ambulatory Visit: Payer: Self-pay

## 2018-11-18 ENCOUNTER — Emergency Department (HOSPITAL_COMMUNITY): Payer: Medicare Other

## 2018-11-18 ENCOUNTER — Encounter (HOSPITAL_COMMUNITY): Payer: Self-pay | Admitting: Emergency Medicine

## 2018-11-18 ENCOUNTER — Observation Stay (HOSPITAL_COMMUNITY)
Admission: EM | Admit: 2018-11-18 | Discharge: 2018-11-19 | Disposition: A | Discharge status: Home / Self Care | Payer: Medicare Other | Attending: Internal Medicine | Admitting: Internal Medicine

## 2018-11-18 DIAGNOSIS — M79605 Pain in left leg: Secondary | ICD-10-CM | POA: Diagnosis present

## 2018-11-18 DIAGNOSIS — K219 Gastro-esophageal reflux disease without esophagitis: Secondary | ICD-10-CM | POA: Insufficient documentation

## 2018-11-18 DIAGNOSIS — E669 Obesity, unspecified: Secondary | ICD-10-CM | POA: Diagnosis not present

## 2018-11-18 DIAGNOSIS — I482 Chronic atrial fibrillation, unspecified: Secondary | ICD-10-CM

## 2018-11-18 DIAGNOSIS — I5032 Chronic diastolic (congestive) heart failure: Secondary | ICD-10-CM | POA: Diagnosis not present

## 2018-11-18 DIAGNOSIS — I10 Essential (primary) hypertension: Secondary | ICD-10-CM

## 2018-11-18 DIAGNOSIS — E876 Hypokalemia: Secondary | ICD-10-CM | POA: Insufficient documentation

## 2018-11-18 DIAGNOSIS — Z7982 Long term (current) use of aspirin: Secondary | ICD-10-CM | POA: Diagnosis not present

## 2018-11-18 DIAGNOSIS — Z20828 Contact with and (suspected) exposure to other viral communicable diseases: Secondary | ICD-10-CM | POA: Diagnosis not present

## 2018-11-18 DIAGNOSIS — R6510 Systemic inflammatory response syndrome (SIRS) of non-infectious origin without acute organ dysfunction: Secondary | ICD-10-CM | POA: Insufficient documentation

## 2018-11-18 DIAGNOSIS — L03116 Cellulitis of left lower limb: Principal | ICD-10-CM

## 2018-11-18 LAB — CBC WITH DIFFERENTIAL/PLATELET
Abs Immature Granulocytes: 0.03 10*3/uL (ref 0.00–0.07)
Basophils Absolute: 0 10*3/uL (ref 0.0–0.1)
Basophils Relative: 0 %
Eosinophils Absolute: 0.4 10*3/uL (ref 0.0–0.5)
Eosinophils Relative: 5 %
HCT: 40.9 % (ref 36.0–46.0)
Hemoglobin: 13 g/dL (ref 12.0–15.0)
Immature Granulocytes: 0 %
Lymphocytes Relative: 16 %
Lymphs Abs: 1.2 10*3/uL (ref 0.7–4.0)
MCH: 31.6 pg (ref 26.0–34.0)
MCHC: 31.8 g/dL (ref 30.0–36.0)
MCV: 99.3 fL (ref 80.0–100.0)
Monocytes Absolute: 0.7 10*3/uL (ref 0.1–1.0)
Monocytes Relative: 9 %
Neutro Abs: 5.4 10*3/uL (ref 1.7–7.7)
Neutrophils Relative %: 70 %
Platelets: 179 10*3/uL (ref 150–400)
RBC: 4.12 MIL/uL (ref 3.87–5.11)
RDW: 12.2 % (ref 11.5–15.5)
WBC: 7.7 10*3/uL (ref 4.0–10.5)
nRBC: 0 % (ref 0.0–0.2)

## 2018-11-18 LAB — SARS CORONAVIRUS 2 BY RT PCR (HOSPITAL ORDER, PERFORMED IN ~~LOC~~ HOSPITAL LAB): SARS Coronavirus 2: NEGATIVE

## 2018-11-18 LAB — BASIC METABOLIC PANEL
Anion gap: 9 (ref 5–15)
BUN: 18 mg/dL (ref 8–23)
CO2: 27 mmol/L (ref 22–32)
Calcium: 9.2 mg/dL (ref 8.9–10.3)
Chloride: 105 mmol/L (ref 98–111)
Creatinine, Ser: 0.74 mg/dL (ref 0.44–1.00)
GFR calc Af Amer: >60 mL/min (ref >60)
GFR calc non Af Amer: >60 mL/min (ref >60)
Glucose, Bld: 100 mg/dL — ABNORMAL HIGH (ref 70–99)
Potassium: 3.3 mmol/L — ABNORMAL LOW (ref 3.5–5.1)
Sodium: 141 mmol/L (ref 135–145)

## 2018-11-18 LAB — NOVEL CORONAVIRUS, NAA: SARS-CoV-2, NAA: NOT DETECTED

## 2018-11-18 LAB — LACTIC ACID, PLASMA: Lactic Acid, Venous: 0.9 mmol/L (ref 0.5–1.9)

## 2018-11-18 MED ORDER — SODIUM CHLORIDE 0.9 % IV SOLN: 1.0000 g | INTRAVENOUS | Status: DC

## 2018-11-18 MED ORDER — ACETAMINOPHEN 325 MG PO TABS
650.0000 mg | ORAL_TABLET | Freq: Four times a day (QID) | ORAL | Status: DC | PRN
  Administered 2018-11-19: 650 mg via ORAL
  Filled 2018-11-18: qty 2

## 2018-11-18 MED ORDER — ONDANSETRON HCL 4 MG PO TABS: 4.0000 mg | ORAL_TABLET | Freq: Four times a day (QID) | ORAL | Status: DC | PRN

## 2018-11-18 MED ORDER — ACETAMINOPHEN 650 MG RE SUPP: 650.0000 mg | Freq: Four times a day (QID) | RECTAL | Status: DC | PRN

## 2018-11-18 MED ORDER — CEFAZOLIN SODIUM-DEXTROSE 1-4 GM/50ML-% IV SOLN
1.0000 g | Freq: Once | INTRAVENOUS | Status: AC
  Administered 2018-11-18: 21:00:00 1 g via INTRAVENOUS
  Filled 2018-11-18 (×2): qty 50

## 2018-11-18 MED ORDER — ENOXAPARIN SODIUM 60 MG/0.6ML ~~LOC~~ SOLN
0.5000 mg/kg | SUBCUTANEOUS | Status: DC
  Administered 2018-11-19: 55 mg via SUBCUTANEOUS
  Filled 2018-11-18: qty 0.6

## 2018-11-18 MED ORDER — SODIUM CHLORIDE 0.9 % IV SOLN
2.0000 g | INTRAVENOUS | Status: DC
  Administered 2018-11-19: 2 g via INTRAVENOUS
  Filled 2018-11-18: qty 20

## 2018-11-18 MED ORDER — ONDANSETRON HCL 4 MG/2ML IJ SOLN: 4.0000 mg | Freq: Four times a day (QID) | INTRAMUSCULAR | Status: DC | PRN

## 2018-11-18 MED ORDER — POTASSIUM CHLORIDE CRYS ER 20 MEQ PO TBCR
40.0000 meq | EXTENDED_RELEASE_TABLET | Freq: Two times a day (BID) | ORAL | Status: AC
  Administered 2018-11-18 – 2018-11-19 (×2): 40 meq via ORAL
  Filled 2018-11-18 (×2): qty 2

## 2018-11-18 MED ORDER — POLYETHYLENE GLYCOL 3350 17 G PO PACK: 17.0000 g | PACK | Freq: Every day | ORAL | Status: DC | PRN

## 2018-11-18 MED ORDER — SODIUM CHLORIDE 0.9 % IV BOLUS
500.0000 mL | Freq: Once | INTRAVENOUS | Status: AC
  Administered 2018-11-18: 500 mL via INTRAVENOUS

## 2018-11-18 NOTE — ED Triage Notes (Signed)
Patient reports infection in her L leg that started 3 days ago. Leg edematous and red. Patient febrile.

## 2018-11-18 NOTE — ED Notes (Signed)
ED TO INPATIENT HANDOFF REPORT  ED Nurse Name and Phone #: Antony Blackbird RN [phone]  S Name/Age/Gender Lelon Mast 73 y.o. female Room/Bed: APA06/APA06  Code Status   Code Status: Not on file  Home/SNF/Other Home Patient oriented to: self, place, time and situation Is this baseline? Yes   Triage Complete: Triage complete  Chief Complaint Leg pain   Triage Note Patient reports infection in her L leg that started 3 days ago. Leg edematous and red. Patient febrile.   Allergies Allergies  Allergen Reactions  . Tetanus Toxoids Other (See Comments)    Reaction occurred more than 40 yrs ago--does not remember what occurred.    Level of Care/Admitting Diagnosis ED Disposition    ED Disposition Condition [facility] Area: [facility] [id number]  Level of Care: Telemetry [5]  Covid Evaluation: Asymptomatic Screening Protocol (No Symptoms)  Diagnosis: Cellulitis of left lower extremity [id number]  Admitting Physician: Bethena Roys [id number]  Attending Physician: Bethena Roys Nessa.Cuff  PT Class (Do Not Modify): Observation [104]  PT Acc Code (Do Not Modify): Observation [10022]       B Medical/Surgery History Past Medical History:  Diagnosis Date  . Arthritis   . Atrial fibrillation ([place])   . Cataract   . Dysrhythmia    AFib  . GERD (gastroesophageal reflux disease)   . Hemochromatosis   . Iron excess    Past Surgical History:  Procedure Laterality Date  . CESAREAN SECTION    . TONSILLECTOMY    . VENTRAL HERNIA REPAIR N/A 01/26/2018   Procedure: HERNIA REPAIR VENTRAL ADULT WITH MESH;  Surgeon: Virl Cagey, MD;  Location: [facility];  Service: General;  Laterality: N/A;     A IV Location/Drains/Wounds Patient Lines/Drains/Airways Status   Active Line/Drains/Airways    Name:   Placement date:   Placement time:   Site:   Days:   Peripheral IV 11/18/18 Left Antecubital   11/18/18    2045    Antecubital   less than 1   Incision  (Closed) 01/26/18 Abdomen Other (Comment)   01/26/18    1021     296          Intake/Output Last 24 hours No intake or output data in the 24 hours ending 11/18/18 2217  Labs/Imaging Results for orders placed or performed during the hospital encounter of 11/18/18 (from the past 48 hour(s))  CBC with Differential     Status: None   Collection Time: 11/18/18  4:58 PM  Result Value Ref Range   WBC 7.7 4.0 - 10.5 K/uL   RBC 4.12 3.87 - 5.11 MIL/uL   Hemoglobin 13.0 12.0 - 15.0 g/dL   HCT 40.9 36.0 - 46.0 %   MCV 99.3 80.0 - 100.0 fL   MCH 31.6 26.0 - 34.0 pg   MCHC 31.8 30.0 - 36.0 g/dL   RDW 12.2 11.5 - 15.5 %   Platelets 179 150 - 400 K/uL   nRBC 0.0 0.0 - 0.2 %   Neutrophils Relative % 70 %   Neutro Abs 5.4 1.7 - 7.7 K/uL   Lymphocytes Relative 16 %   Lymphs Abs 1.2 0.7 - 4.0 K/uL   Monocytes Relative 9 %   Monocytes Absolute 0.7 0.1 - 1.0 K/uL   Eosinophils Relative 5 %   Eosinophils Absolute 0.4 0.0 - 0.5 K/uL   Basophils Relative 0 %   Basophils Absolute 0.0 0.0 - 0.1 K/uL   Immature Granulocytes 0 %  Abs Immature Granulocytes 0.03 0.00 - 0.07 K/uL    Comment: Performed at [facility], [address], [place], [place] [id number]  Basic metabolic panel     Status: Abnormal   Collection Time: 11/18/18  4:58 PM  Result Value Ref Range   Sodium 141 135 - 145 mmol/L   Potassium 3.3 (L) 3.5 - 5.1 mmol/L   Chloride 105 98 - 111 mmol/L   CO2 27 22 - 32 mmol/L   Glucose, Bld 100 (H) 70 - 99 mg/dL   BUN 18 8 - 23 mg/dL   Creatinine, Ser 0.74 0.44 - 1.00 mg/dL   Calcium 9.2 8.9 - 10.3 mg/dL   GFR calc non Af Amer >60 >60 mL/min   GFR calc Af Amer >60 >60 mL/min   Anion gap 9 5 - 15    Comment: Performed at [facility], [address], [place], [place] [address]  Lactic acid, plasma     Status: None   Collection Time: 11/18/18  4:58 PM  Result Value Ref Range   Lactic Acid, Venous 0.9 0.5 - 1.9 mmol/L    Comment: Performed at [facility], [address] [place],  [place] [address]  SARS Coronavirus 2 [facility] order, Performed in [facility] hospital lab) Nasopharyngeal Nasopharyngeal Swab     Status: None   Collection Time: 11/18/18  8:48 PM   Specimen: Nasopharyngeal Swab  Result Value Ref Range   SARS Coronavirus 2 NEGATIVE NEGATIVE    Comment: (NOTE) If result is NEGATIVE SARS-CoV-2 target nucleic acids are NOT DETECTED. The SARS-CoV-2 RNA is generally detectable in upper and lower  respiratory specimens during the acute phase of infection. The lowest  concentration of SARS-CoV-2 viral copies this assay can detect is 250  copies / mL. A negative result does not preclude SARS-CoV-2 infection  and should not be used as the sole basis for treatment or other  patient management decisions.  A negative result may occur with  improper specimen collection / handling, submission of specimen other  than nasopharyngeal swab, presence of viral mutation(s) within the  areas targeted by this assay, and inadequate number of viral copies  (<250 copies / mL). A negative result must be combined with clinical  observations, patient history, and epidemiological information. If result is POSITIVE SARS-CoV-2 target nucleic acids are DETECTED. The SARS-CoV-2 RNA is generally detectable in upper and lower  respiratory specimens dur ing the acute phase of infection.  Positive  results are indicative of active infection with SARS-CoV-2.  Clinical  correlation with patient history and other diagnostic information is  necessary to determine patient infection status.  Positive results do  not rule out bacterial infection or co-infection with other viruses. If result is PRESUMPTIVE POSTIVE SARS-CoV-2 nucleic acids MAY BE PRESENT.   A presumptive positive result was obtained on the submitted specimen  and confirmed on repeat testing.  While 2019 novel coronavirus  (SARS-CoV-2) nucleic acids may be present in the submitted sample  additional confirmatory testing may be  necessary for epidemiological  and / or clinical management purposes  to differentiate between  SARS-CoV-2 and other Sarbecovirus currently known to infect humans.  If clinically indicated additional testing with an alternate test  methodology [phone]) is advised. The SARS-CoV-2 RNA is generally  detectable in upper and lower respiratory sp ecimens during the acute  phase of infection. The expected result is Negative. Fact Sheet for Patients:  StrictlyIdeas.no Fact Sheet for Healthcare Providers: BankingDealers.co.za This test is not yet approved or  cleared by the [place] and has been authorized for detection and/or diagnosis of SARS-CoV-2 by FDA under an Emergency Use Authorization (EUA).  This EUA will remain in effect (meaning this test can be used) for the duration of the COVID-19 declaration under Section 564(b)(1) of the Act, 21 U.S.C. section 360bbb-3(b)(1), unless the authorization is terminated or revoked sooner. Performed at [facility], [address] [place], [place] [address]    US Venous Img Lower Unilateral Left  Result Date: 11/18/2018 CLINICAL DATA:  73 year old female with left lower extremity redness and swelling. EXAM: LEFT LOWER EXTREMITY VENOUS DOPPLER ULTRASOUND TECHNIQUE: Gray-scale sonography with graded compression, as well as color Doppler and duplex ultrasound were performed to evaluate the lower extremity deep venous systems from the level of the common femoral vein and including the common femoral, femoral, profunda femoral, popliteal and calf veins including the posterior tibial, peroneal and gastrocnemius veins when visible. The superficial great saphenous vein was also interrogated. Spectral Doppler was utilized to evaluate flow at rest and with distal augmentation maneuvers in the common femoral, femoral and popliteal veins. COMPARISON:  None. FINDINGS: Contralateral Common Femoral Vein: Respiratory  phasicity is normal and symmetric with the symptomatic side. No evidence of thrombus. Normal compressibility. Common Femoral Vein: No evidence of thrombus. Normal compressibility, respiratory phasicity and response to augmentation. Saphenofemoral Junction: No evidence of thrombus. Normal compressibility and flow on color Doppler imaging. Profunda Femoral Vein: No evidence of thrombus. Normal compressibility and flow on color Doppler imaging. Femoral Vein: No evidence of thrombus. Normal compressibility, respiratory phasicity and response to augmentation. Popliteal Vein: No evidence of thrombus. Normal compressibility, respiratory phasicity and response to augmentation. Calf Veins: No evidence of thrombus. Normal compressibility and flow on color Doppler imaging. Superficial Great Saphenous Vein: No evidence of thrombus. Normal compressibility. Venous Reflux:  None. Other Findings:  None. IMPRESSION: No evidence of deep venous thrombosis. Electronically Signed   By: Jacqulynn Cadet M.D.   On: 11/18/2018 17:06    Pending Labs Unresulted Labs (From admission, onward)    Start     Ordered   11/18/18 2028  Culture, blood (routine x 2)  BLOOD CULTURE X 2,   STAT     11/18/18 2027   Signed and Held  Basic metabolic panel  Tomorrow morning,   R     Signed and Held          Vitals/Pain Today's Vitals   11/18/18 1553 11/18/18 1554 11/18/18 2104 11/18/18 2130  BP: 106/81  (!) 152/76 (!) 149/82  Pulse: (!) 115  94 (!) 101  Resp: (!) 22  19 17   Temp: 100 F (37.8 C)     TempSrc: Oral     SpO2: 95%  98% 100%  Weight:  108.9 kg    Height:  5\' 6"  (1.676 m)      Isolation Precautions No active isolations  Medications Medications  potassium chloride SA (K-DUR) CR tablet 40 mEq (40 mEq Oral Given 11/18/18 2155)  ceFAZolin (ANCEF) IVPB 1 g/50 mL premix (0 g Intravenous Stopped 11/18/18 2133)  sodium chloride 0.9 % bolus 500 mL (500 mLs Intravenous New Bag/Given 11/18/18 2102)    Mobility walks with  device Moderate fall risk   Focused Assessments    R Recommendations: See Admitting Provider Note  Report given to:   Additional Notes:

## 2018-11-18 NOTE — H&P (Addendum)
History and Physical    Amanda Mckay [id number] DOB: 1945-05-11 DOA: 11/18/2018  PCP: Alycia Rossetti, MD   Patient coming from: Home  I have personally briefly reviewed patient's old medical records in [place]  Chief Complaint: Left lower extremity swelling redness pain.  HPI: Amanda Mckay is a 73 y.o. female with medical history significant for atrial fibrillation, hemochromatosis, dyslipidemia, presented to the ED with complaints of pain redness and swelling in her left leg.  Patient has a small wound between her third and fourth toe and fourth and fifth toe which she thought was athlete's foot.  Yesterday morning she noticed some redness and swelling involving her foot, and by today the redness or swelling had extended towards her knee.  She reports chills, low-grade fevers ~ 99.  She reports multiple episodes of vomiting 3 days ago, but that has resolved.  No abdominal pain, no loose stools.  She denies cough or difficulty breathing..  She had complete test done as an outpatient but this is still pending.  ED Course:  Temp 100.  Heart rate initially 115.  WBC 7.7.  Normal lactic acid 0.9.  Lower extremity venous ultrasound negative.  EKG shows sinus rhythm.  Patient was started on IV cefazolin hospitalist to admit for cellulitis.  Review of Systems: As per HPI all other systems reviewed and negative.  Past Medical History:  Diagnosis Date   Arthritis    Atrial fibrillation ([place])    Cataract    Dysrhythmia    AFib   GERD (gastroesophageal reflux disease)    Hemochromatosis    Iron excess     Past Surgical History:  Procedure Laterality Date   CESAREAN SECTION     TONSILLECTOMY     VENTRAL HERNIA REPAIR N/A 01/26/2018   Procedure: HERNIA REPAIR VENTRAL ADULT WITH MESH;  Surgeon: Virl Cagey, MD;  Location: [facility];  Service: General;  Laterality: N/A;     reports that she has never smoked. She has never used smokeless tobacco. She reports current  alcohol use of about 2.0 standard drinks of alcohol per week. She reports that she does not use drugs.  Allergies  Allergen Reactions   Tetanus Toxoids Other (See Comments)    Reaction occurred more than 40 yrs ago--does not remember what occurred.    Family History  Problem Relation Age of Onset   Stroke Mother    Atrial fibrillation Son    Stroke Maternal Grandmother    Diabetes Paternal Grandfather    Arthritis Brother     Prior to Admission medications   Medication Sig Start Date End Date Taking? Authorizing Provider  acetaminophen (TYLENOL) 325 MG tablet Take 650 mg by mouth 2 (two) times daily as needed for moderate pain or headache.    Yes [provider]  aspirin 325 MG tablet Take 325 mg by mouth every evening.    Yes [provider]  Calcium Carb-Cholecalciferol (CALCIUM 600+D3 PO) Take 1 tablet by mouth 2 (two) times daily.   Yes [provider]  Cholecalciferol (D3-1000) 1000 units capsule Take 1,000 Units by mouth daily.   Yes [provider]  famotidine (PEPCID) 20 MG tablet Take 20 mg by mouth at bedtime. *May take additional tablet as needed   Yes [provider]  flecainide (TAMBOCOR) 100 MG tablet TAKE ONE-HALF TABLET BY MOUTH TWO TIMES DAILY. Patient taking differently: Take 50 mg by mouth 2 (two) times daily.  03/16/18  Yes Herminio Commons, MD  metoprolol  tartrate (LOPRESSOR) 25 MG tablet TAKE ONE TABLET BY MOUTH TWICE A DAY Patient taking differently: Take 25 mg by mouth 2 (two) times daily.  08/20/18  Yes Herminio Commons, MD  trolamine salicylate (ASPERCREME) 10 % cream Apply 1 application topically daily as needed for muscle pain.   Yes [provider]  diclofenac (CATAFLAM) 50 MG tablet TAKE ONE TABLET BY MOUTH TWICE DAILY. TAKE WITH FOOD Patient not taking: Reported on 11/18/2018 11/04/18   Carole Civil, MD    Physical Exam: Vitals:   11/18/18 1553 11/18/18 1554 11/18/18 2104 11/18/18  2130  BP: 106/81  (!) 152/76 (!) 149/82  Pulse: (!) 115  94 (!) 101  Resp: (!) 22  19 17   Temp: 100 F (37.8 C)     TempSrc: Oral     SpO2: 95%  98% 100%  Weight:  108.9 kg    Height:  5\' 6"  (1.676 m)      Constitutional: NAD, calm, comfortable Vitals:   11/18/18 1553 11/18/18 1554 11/18/18 2104 11/18/18 2130  BP: 106/81  (!) 152/76 (!) 149/82  Pulse: (!) 115  94 (!) 101  Resp: (!) 22  19 17   Temp: 100 F (37.8 C)     TempSrc: Oral     SpO2: 95%  98% 100%  Weight:  108.9 kg    Height:  5\' 6"  (1.676 m)     Eyes: PERRL, lids and conjunctivae normal ENMT: Mucous membranes are moist. Posterior pharynx clear of any exudate or lesions.Normal dentition.  Neck: normal, supple, no masses, no thyromegaly Respiratory: clear to auscultation bilaterally, no wheezing, no crackles. Normal respiratory effort. No accessory muscle use.  Cardiovascular: Regular rate and rhythm, no murmurs / rubs / gallops. No extremity edema. 2+ pedal pulses.   Abdomen: no tenderness, no masses palpated. No hepatosplenomegaly. Bowel sounds positive.  Musculoskeletal: no clubbing / cyanosis. No joint deformity upper and lower extremities. Good ROM, no contractures. Normal muscle tone.  Skin: Small wound between third and fourth toe and fourth and fifth toe on the left.  Wounds are superficial, no drainage, no surrounding erythema.  Erythema extends from her ankles towards her knees on her left lower extremity. Neurologic: CN 2-12 grossly intact. Sensation intact, DTR normal. Strength 5/5 in all 4.  Psychiatric: Normal judgment and insight. Alert and oriented x 3. Normal mood.          Labs on Admission: I have personally reviewed following labs and imaging studies  CBC: Recent Labs  Lab 11/18/18 1658  WBC 7.7  NEUTROABS 5.4  HGB 13.0  HCT 40.9  MCV 99.3  PLT 0000000   Basic Metabolic Panel: Recent Labs  Lab 11/18/18 1658  NA 141  K 3.3*  CL 105  CO2 27  GLUCOSE 100*  BUN 18  CREATININE 0.74    CALCIUM 9.2    Radiological Exams on Admission: US Venous Img Lower Unilateral Left  Result Date: 11/18/2018 CLINICAL DATA:  73 year old female with left lower extremity redness and swelling. EXAM: LEFT LOWER EXTREMITY VENOUS DOPPLER ULTRASOUND TECHNIQUE: Gray-scale sonography with graded compression, as well as color Doppler and duplex ultrasound were performed to evaluate the lower extremity deep venous systems from the level of the common femoral vein and including the common femoral, femoral, profunda femoral, popliteal and calf veins including the posterior tibial, peroneal and gastrocnemius veins when visible. The superficial great saphenous vein was also interrogated. Spectral Doppler was utilized to evaluate flow at rest and with distal augmentation maneuvers  in the common femoral, femoral and popliteal veins. COMPARISON:  None. FINDINGS: Contralateral Common Femoral Vein: Respiratory phasicity is normal and symmetric with the symptomatic side. No evidence of thrombus. Normal compressibility. Common Femoral Vein: No evidence of thrombus. Normal compressibility, respiratory phasicity and response to augmentation. Saphenofemoral Junction: No evidence of thrombus. Normal compressibility and flow on color Doppler imaging. Profunda Femoral Vein: No evidence of thrombus. Normal compressibility and flow on color Doppler imaging. Femoral Vein: No evidence of thrombus. Normal compressibility, respiratory phasicity and response to augmentation. Popliteal Vein: No evidence of thrombus. Normal compressibility, respiratory phasicity and response to augmentation. Calf Veins: No evidence of thrombus. Normal compressibility and flow on color Doppler imaging. Superficial Great Saphenous Vein: No evidence of thrombus. Normal compressibility. Venous Reflux:  None. Other Findings:  None. IMPRESSION: No evidence of deep venous thrombosis. Electronically Signed   By: Jacqulynn Cadet M.D.   On: 11/18/2018 17:06    EKG:  Independently reviewed.  Sinus rhythm rate 95.  QTc 435.  No significant change from prior.  Assessment/Plan Active Problems:   Cellulitis of left lower extremity    Cellulitis of left lower extremity- temperature 100, heart rate 115.  WBC 7.7.  Rules out for sepsis.  With normal lactic acid 0.9.  Venous ultrasound negative for DVT.  IV cefazolin started in ED. -Will use IV ceftriaxone 2 g daily- adjust for weight.  Hypokalemia-mild.  K- 3.3.  Likely from vomiting. -Replete. - BMP a.m  Atrial fibrillation-rate controlled.  Currently in sinus rhythm.  Reports compliance with flecainide and metoprolol.  Aspirin 325 mg daily.  She tells me anticoagulation was discussed cardiologist, but she had been on aspirin from her prior provider and done well with it so she opted at this time not to switch to anticoagulation. -Resume home flecainide and metoprolol -Resume home aspirin.  Hemochromatosis-hemoglobin stable at 13.   DVT prophylaxis: Lovenox Code Status: Full code Family Communication: Family at bedside spouse Disposition Plan: 1 to 2 days Consults called: None Admission status: Obs, telemetry   Bethena Roys MD [facility]  11/18/2018, 10:59 PM

## 2018-11-18 NOTE — ED Provider Notes (Signed)
[facility] EMERGENCY DEPARTMENT Provider Note   CSN: [id number] Arrival date & time: 11/18/18  1540     History   Chief Complaint Chief Complaint  Patient presents with   Leg Pain    HPI Amanda Mckay is a 73 y.o. female.  She has a history of A. fib and is on an aspirin for that.  She is complaining of 2 or 3 days of pain in her left foot with a small ulceration underneath.  Starting yesterday she noticed some itchiness and pain on the top of the foot and today she saw the redness extending up to about her knee.  She is felt feverish and cold and a little nauseous and had some dry heaves.  No prior history of cellulitis.  She told her PCP yesterday and they order a Covid test which is still pending.  No cough no shortness of breath no sore throat     The history is provided by the patient.  Leg Pain Location:  Leg, ankle, foot and toe Time since incident:  3 days Injury: no   Leg location:  L lower leg Pain details:    Quality:  Throbbing   Severity:  Moderate   Onset quality:  Gradual   Timing:  Constant   Progression:  Worsening Chronicity:  New Relieved by:  Nothing Worsened by:  Nothing Ineffective treatments:  None tried Associated symptoms: fever, itching and swelling   Associated symptoms: no numbness and no tingling     Past Medical History:  Diagnosis Date   Arthritis    Atrial fibrillation (HCC)    Cataract    Dysrhythmia    AFib   GERD (gastroesophageal reflux disease)    Hemochromatosis    Iron excess     Patient Active Problem List   Diagnosis Date Noted   Ventral hernia without obstruction or gangrene 08/01/2017   Mixed hyperlipidemia 02/27/2017   Vitamin D deficiency 02/27/2017   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 Authorizing Provider  acetaminophen (TYLENOL) 325 MG tablet Take 650 mg by mouth 2 (two) times daily as needed for moderate pain or headache.     [provider]  aspirin 325 MG tablet Take 325 mg by mouth every evening.     [provider]  Calcium Carb-Cholecalciferol (CALCIUM 600+D3 PO) Take 1 tablet by mouth 2 (two) times daily.    [provider]  Cholecalciferol (D3-1000) 1000 units capsule Take 1,000 Units by mouth daily.    [provider]  diclofenac (CATAFLAM) 50 MG tablet TAKE ONE  TABLET BY MOUTH TWICE DAILY. TAKE WITH FOOD 11/04/18   Carole Civil, MD  famotidine (PEPCID) 20 MG tablet Take 20 mg by mouth 2 (two) times daily as needed for heartburn or indigestion.     [provider]  flecainide (TAMBOCOR) 100 MG tablet TAKE ONE-HALF TABLET BY MOUTH TWO TIMES DAILY. 03/16/18   Herminio Commons, MD  metoprolol tartrate (LOPRESSOR) 25 MG tablet TAKE ONE TABLET BY MOUTH TWICE A DAY 08/20/18   Herminio Commons, MD  trolamine salicylate (ASPERCREME) 10 % cream Apply 1 application topically daily as needed for muscle pain.    [provider]    Family History Family History  Problem Relation Age of Onset   Stroke Mother    Atrial fibrillation Son    Stroke Maternal Grandmother    Diabetes Paternal Grandfather    Arthritis Brother     Social History Social History   Tobacco Use   Smoking status: Never Smoker   Smokeless tobacco: Never Used  Substance Use Topics   Alcohol use: Yes    Alcohol/week: 2.0 standard  drinks    Types: 2 Glasses of wine per week   Drug use: No     Allergies   Tetanus toxoids   Review of Systems Review of Systems  Constitutional: Positive for chills and fever.  HENT: Negative for sore throat.   Eyes: Negative for visual disturbance.  Respiratory: Negative for shortness of breath.   Cardiovascular: Negative for chest pain.  Gastrointestinal: Positive for nausea. Negative for abdominal pain.  Genitourinary: Negative for dysuria.  Skin: Positive for itching, rash and wound.  Neurological: Negative for headaches.     Physical Exam Updated Vital Signs BP 106/81 (BP Location: Right Arm)    Pulse (!) 115    Temp 100 F (37.8 C) (Oral)    Resp (!) 22    Ht 5\' 6"  (1.676 m)    Wt 108.9 kg    SpO2 95%    BMI 38.74 kg/m   Physical Exam Vitals signs and nursing note reviewed.  Constitutional:      General: She is not in acute distress.    Appearance: She is well-developed.  HENT:     Head: Normocephalic and atraumatic.  Eyes:     Conjunctiva/sclera: Conjunctivae normal.  Neck:     Musculoskeletal: Neck supple.  Cardiovascular:     Rate and Rhythm: Tachycardia present. Rhythm irregular.     Heart sounds: No murmur.  Pulmonary:     Effort: Pulmonary effort is normal. No respiratory distress.     Breath sounds: Normal breath sounds.  Abdominal:     Palpations: Abdomen is soft.     Tenderness: There is no abdominal tenderness.  Musculoskeletal: Normal range of motion.        General: Tenderness present.     Left lower leg: Edema present.     Comments: Left lower extremity she is got approximately 1 cm superficial ulceration underneath her third toe.  There is not much redness on the dorsum of the foot but she has redness and swelling from her ankle up to her knee is warm to touch and tender.  Skin:    General: Skin is warm and dry.     Capillary Refill: Capillary refill takes less than 2 seconds.  Neurological:     General: No focal deficit present.      Mental Status: She is alert and oriented to person, place, and time.  ED Treatments / Results  Labs (all labs ordered are listed, but only abnormal results are displayed) Labs Reviewed  BASIC METABOLIC PANEL - Abnormal; Notable for the following components:      Result Value   Potassium 3.3 (*)    Glucose, Bld 100 (*)    All other components within normal limits  CULTURE, BLOOD (ROUTINE X 2)  CULTURE, BLOOD (ROUTINE X 2)  SARS CORONAVIRUS 2 (HOSPITAL ORDER, [facility])  CBC WITH DIFFERENTIAL/PLATELET  LACTIC ACID, PLASMA    EKG None  Radiology US Venous Img Lower Unilateral Left  Result Date: 11/18/2018 CLINICAL DATA:  73 year old female with left lower extremity redness and swelling. EXAM: LEFT LOWER EXTREMITY VENOUS DOPPLER ULTRASOUND TECHNIQUE: Gray-scale sonography with graded compression, as well as color Doppler and duplex ultrasound were performed to evaluate the lower extremity deep venous systems from the level of the common femoral vein and including the common femoral, femoral, profunda femoral, popliteal and calf veins including the posterior tibial, peroneal and gastrocnemius veins when visible. The superficial great saphenous vein was also interrogated. Spectral Doppler was utilized to evaluate flow at rest and with distal augmentation maneuvers in the common femoral, femoral and popliteal veins. COMPARISON:  None. FINDINGS: Contralateral Common Femoral Vein: Respiratory phasicity is normal and symmetric with the symptomatic side. No evidence of thrombus. Normal compressibility. Common Femoral Vein: No evidence of thrombus. Normal compressibility, respiratory phasicity and response to augmentation. Saphenofemoral Junction: No evidence of thrombus. Normal compressibility and flow on color Doppler imaging. Profunda Femoral Vein: No evidence of thrombus. Normal compressibility and flow on color Doppler imaging. Femoral Vein: No evidence of thrombus.  Normal compressibility, respiratory phasicity and response to augmentation. Popliteal Vein: No evidence of thrombus. Normal compressibility, respiratory phasicity and response to augmentation. Calf Veins: No evidence of thrombus. Normal compressibility and flow on color Doppler imaging. Superficial Great Saphenous Vein: No evidence of thrombus. Normal compressibility. Venous Reflux:  None. Other Findings:  None. IMPRESSION: No evidence of deep venous thrombosis. Electronically Signed   By: Jacqulynn Cadet M.D.   On: 11/18/2018 17:06    Procedures Procedures (including critical care time)  Medications Ordered in ED Medications  ceFAZolin (ANCEF) IVPB 1 g/50 mL premix (has no administration in time range)  sodium chloride 0.9 % bolus 500 mL (has no administration in time range)     Initial Impression / Assessment and Plan / ED Course  I have reviewed the triage vital signs and the nursing notes.  Pertinent labs & imaging results that were available during my care of the patient were reviewed by me and considered in my medical decision making (see chart for details).  Clinical Course as of Nov 18 940  Wed Nov 18, 2018  2048 Patient here with quickly worsening cellulitis of her left lower extremity with an underlying ulcer underneath one 1 of her toes.  Differential includes cellulitis, lymphangitis, DVT.  Duplex is negative.  I have ordered her some IV antibiotics.  She does not have a white count or an elevated lactic acid although does have a low-grade fever here and is tachycardic.  History of A. fib.  Will need admission.   [MB]  2110 Discussed with Dr. Denton Brick who will evaluate the patient for admission.   [MB]  2111 EKG is normal sinus rhythm rate of 95 no acute ST-T changes.   [MB]  2154 ECG shows normal sinus rhythm no acute ST-T changes.   [MB]    Clinical Course User  Index [MB] Hayden Rasmussen, MD      CHA2DS2/VAS Stroke Risk Points  Current as of 29 minutes ago     2  >= 2 Points: High Risk  1 - 1.99 Points: Medium Risk  0 Points: Low Risk    This is the only CHA2DS2/VAS Stroke Risk Points available for the past  year.: Last Change: N/A     Details    This score determines the patient's risk of having a stroke if the  patient has atrial fibrillation.       Points Metrics  0 Has Congestive Heart Failure:  No    Current as of 29 minutes ago  0 Has Vascular Disease:  No    Current as of 29 minutes ago  0 Has Hypertension:  No    Current as of 29 minutes ago  1 Age:  71    Current as of 29 minutes ago  0 Has Diabetes:  No    Current as of 29 minutes ago  0 Had Stroke:  No  Had TIA:  No  Had thromboembolism:  No    Current as of 29 minutes ago  1 Female:  Yes    Current as of 29 minutes ago             Final Clinical Impressions(s) / ED Diagnoses   Final diagnoses:  Cellulitis of leg, left  SIRS (systemic inflammatory response syndrome) [facility])    ED Discharge Orders    None       Hayden Rasmussen, MD 11/19/18 0945

## 2018-11-19 DIAGNOSIS — I482 Chronic atrial fibrillation, unspecified: Secondary | ICD-10-CM

## 2018-11-19 DIAGNOSIS — L03116 Cellulitis of left lower limb: Secondary | ICD-10-CM | POA: Diagnosis not present

## 2018-11-19 DIAGNOSIS — I5032 Chronic diastolic (congestive) heart failure: Secondary | ICD-10-CM | POA: Diagnosis not present

## 2018-11-19 DIAGNOSIS — I10 Essential (primary) hypertension: Secondary | ICD-10-CM | POA: Diagnosis not present

## 2018-11-19 LAB — BASIC METABOLIC PANEL
Anion gap: 7 (ref 5–15)
BUN: 13 mg/dL (ref 8–23)
CO2: 25 mmol/L (ref 22–32)
Calcium: 8.4 mg/dL — ABNORMAL LOW (ref 8.9–10.3)
Chloride: 107 mmol/L (ref 98–111)
Creatinine, Ser: 0.7 mg/dL (ref 0.44–1.00)
GFR calc Af Amer: >60 mL/min (ref >60)
GFR calc non Af Amer: >60 mL/min (ref >60)
Glucose, Bld: 103 mg/dL — ABNORMAL HIGH (ref 70–99)
Potassium: 3.8 mmol/L (ref 3.5–5.1)
Sodium: 139 mmol/L (ref 135–145)

## 2018-11-19 MED ORDER — CEPHALEXIN 500 MG PO CAPS: 500.0000 mg | ORAL_CAPSULE | Freq: Three times a day (TID) | ORAL | 0 refills | Status: AC

## 2018-11-19 NOTE — Discharge Summary (Signed)
Physician Discharge Summary  Amanda Mckay [id number] DOB: April 16, 1945 DOA: 11/18/2018  PCP: Alycia Rossetti, MD  Admit date: 11/18/2018 Discharge date: 11/19/2018  Time spent: 30 minutes  Recommendations for Outpatient Follow-up:  1. Reassess patient left lower extremity to assure complete resolution of cellulitis. 2. Repeat basic metabolic panel to follow electrolytes and renal function.   Discharge Diagnoses:  Active Problems:   Atrial fibrillation, chronic   Cellulitis of leg, left   Essential hypertension   Chronic diastolic HF (heart failure) (HCC) Hypokalemia Class II obesity Gastroesophageal reflux disease.  Discharge Condition: Stable and improved.  Patient discharged home with instruction to follow-up with PCP in 10 days.  Diet recommendation: Heart healthy and low calorie diet.  Filed Weights   11/18/18 1554 11/18/18 2323  Weight: 108.9 kg 111 kg    History of present illness:  As per H&P written by Dr. Denton Brick on 11/18/2018 73 y.o. female with medical history significant for atrial fibrillation, hemochromatosis, dyslipidemia, presented to the ED with complaints of pain redness and swelling in her left leg.  Patient has a small wound between her third and fourth toe and fourth and fifth toe which she thought was athlete's foot.  Yesterday morning she noticed some redness and swelling involving her foot, and by today the redness or swelling had extended towards her knee.  She reports chills, low-grade fevers ~ 99.  She reports multiple episodes of vomiting 3 days ago, but that has resolved.  No abdominal pain, no loose stools.  She denies cough or difficulty breathing..  She had complete test done as an outpatient but this is still pending.  ED Course:  Temp 100.  Heart rate initially 115.  WBC 7.7.  Normal lactic acid 0.9.  Lower extremity venous ultrasound negative.  EKG shows sinus rhythm. Patient was started on IV cefazolin hospitalist to admit for  cellulitis.  Hospital Course:  Left lower extremity cellulitis -Afebrile and with normal WBCs at discharge -No nausea or vomiting. -Patient received 24 of IV antibiotics and has been discharge on Keflex with intention to complete another 10 days. -Patient advised to keep her leg elevated as much as possible and to follow-up with her PCP in 10 days.  Chronic atrial fibrillation -Continue flecainide and metoprolol -Rate control at discharge -Continue full dose aspirin. -Continue outpatient follow-up with cardiology service.  Hypokalemia -In the setting of GI losses prior to admission -Currently no further nausea, vomiting or diarrhea -Patient denies abdominal pain -Electrolytes has been repleted -Repeat basic metabolic panel follow-up visit to reassess stability.  Class 2 obesity -Low calorie diet, portion control and increased physical activity has been discussed with patient. -Body mass index is 39.5 kg/m.  Chronic diastolic heart failure -Compensated -Advised to follow low-sodium diet and to watch her weight on daily basis. -Continue outpatient follow-up with cardiology service.  Gastroesophageal reflux disease -Continue famotidine.  Procedures:  Lower extremity Dopplers: Demonstrating no acute deep vein thrombosis on her left lower extremity.  Consultations:  None  Discharge Exam: Vitals:   11/18/18 2323 11/19/18 0445  BP: 137/62 126/68  Pulse: (!) 118 100  Resp: 16 16  Temp: 99 F (37.2 C)   SpO2: 97% 94%    General: Afebrile, no chest pain, no shortness of breath, no nausea, no vomiting.  Still with some redness in her left lower extremity, with but decreased pain and erythematous changes. Cardiovascular: Rate control, no rubs, no gallops, no JVD on exam. Respiratory: Good air movement bilaterally, no using accessory muscles; good  O2 sat on room air. Abdomen: Obese, soft, nontender, nondistended, positive bowel sounds. Extremities: Right lower extremity  with trace edema appreciated no cyanosis, no clubbing, no erythematous changes.  Left lower extremity still with some erythema and slightly higher swelling on examination.  Reports no pain on palpation.  No open wounds on exam.  Discharge Instructions  Discharge Instructions    (HEART FAILURE PATIENTS) Call MD:  Anytime you have any of the following symptoms: 1) 3 pound weight gain in 24 hours or 5 pounds in 1 week 2) shortness of breath, with or without a dry hacking cough 3) swelling in the hands, feet or stomach 4) if you have to sleep on extra pillows at night in order to breathe.   Complete by: As directed    Diet - low sodium heart healthy   Complete by: As directed    Discharge instructions   Complete by: As directed    Take medications as prescribed Maintain left lower extremity elevated on sole cost Maintain adequate hydration Arrange follow-up with PCP in 10 days. Do not forget to take antibiotics around food to minimize GI upset symptoms.     Allergies as of 11/19/2018      Reactions   Tetanus Toxoids Other (See Comments)   Reaction occurred more than 40 yrs ago--does not remember what occurred.      Medication List    STOP taking these medications   diclofenac 50 MG tablet Commonly known as: CATAFLAM     TAKE these medications   acetaminophen 325 MG tablet Commonly known as: TYLENOL Take 650 mg by mouth 2 (two) times daily as needed for moderate pain or headache.   aspirin 325 MG tablet Take 325 mg by mouth every evening.   CALCIUM 600+D3 PO Take 1 tablet by mouth 2 (two) times daily.   cephALEXin 500 MG capsule Commonly known as: KEFLEX Take 1 capsule (500 mg total) by mouth 3 (three) times daily for 10 days.   D3-1000 25 MCG (1000 UT) capsule Generic drug: Cholecalciferol Take 1,000 Units by mouth daily.   famotidine 20 MG tablet Commonly known as: PEPCID Take 20 mg by mouth at bedtime. *May take additional tablet as needed   flecainide 100 MG  tablet Commonly known as: TAMBOCOR TAKE ONE-HALF TABLET BY MOUTH TWO TIMES DAILY. What changed: See the new instructions.   metoprolol tartrate 25 MG tablet Commonly known as: LOPRESSOR TAKE ONE TABLET BY MOUTH TWICE A DAY   trolamine salicylate 10 % cream Commonly known as: ASPERCREME Apply 1 application topically daily as needed for muscle pain.      Allergies  Allergen Reactions  . Tetanus Toxoids Other (See Comments)    Reaction occurred more than 40 yrs ago--does not remember what occurred.   Follow-up Information    [facility], Modena Nunnery, MD. Schedule an appointment as soon as possible for a visit in 10 day(s).   Specialty: Family Medicine Contact information: [address] [place] [address] [phone]           The results of significant diagnostics from this hospitalization (including imaging, microbiology, ancillary and laboratory) are listed below for reference.    Significant Diagnostic Studies: US Venous Img Lower Unilateral Left  Result Date: 11/18/2018 CLINICAL DATA:  73 year old female with left lower extremity redness and swelling. EXAM: LEFT LOWER EXTREMITY VENOUS DOPPLER ULTRASOUND TECHNIQUE: Gray-scale sonography with graded compression, as well as color Doppler and duplex ultrasound were performed to evaluate the lower extremity  deep venous systems from the level of the common femoral vein and including the common femoral, femoral, profunda femoral, popliteal and calf veins including the posterior tibial, peroneal and gastrocnemius veins when visible. The superficial great saphenous vein was also interrogated. Spectral Doppler was utilized to evaluate flow at rest and with distal augmentation maneuvers in the common femoral, femoral and popliteal veins. COMPARISON:  None. FINDINGS: Contralateral Common Femoral Vein: Respiratory phasicity is normal and symmetric with the symptomatic side. No evidence of thrombus. Normal compressibility. Common Femoral  Vein: No evidence of thrombus. Normal compressibility, respiratory phasicity and response to augmentation. Saphenofemoral Junction: No evidence of thrombus. Normal compressibility and flow on color Doppler imaging. Profunda Femoral Vein: No evidence of thrombus. Normal compressibility and flow on color Doppler imaging. Femoral Vein: No evidence of thrombus. Normal compressibility, respiratory phasicity and response to augmentation. Popliteal Vein: No evidence of thrombus. Normal compressibility, respiratory phasicity and response to augmentation. Calf Veins: No evidence of thrombus. Normal compressibility and flow on color Doppler imaging. Superficial Great Saphenous Vein: No evidence of thrombus. Normal compressibility. Venous Reflux:  None. Other Findings:  None. IMPRESSION: No evidence of deep venous thrombosis. Electronically Signed   By: Jacqulynn Cadet M.D.   On: 11/18/2018 17:06   Microbiology: Recent Results (from the past 240 hour(s))  Novel Coronavirus, NAA ([facility])     Status: None   Collection Time: 11/17/18 10:00 AM   Specimen: Oropharyngeal(OP) collection in vial transport medium  Result Value Ref Range Status   SARS-CoV-2, NAA Not Detected Not Detected Final    Comment: Testing was performed using the cobas(R) SARS-CoV-2 test. This nucleic acid amplification test was developed and its perfomance characteristics determined by [employer]. Nucleic acid amplification tests include PCR and TMA. This test has not been FDA cleared or approved. This test has been authorized by FDA under an Emergency Use Authorization (EUA). This test is only authorized for the duration of time the declaration that circumstances exist justifying the authorization of the emergency use of in vitro diagnostic tests for detection of SARS-CoV-2 virus and/or diagnosis of COVID-19 infection under section 564(b)(1) of the Act, 21 U.S.C. [id number]) (1), unless the authorization is terminated or  revoked sooner. When diagnostic testing is negative, the possibility of a false negative result should be considered in the context of a patient's recent exposures and the presence of clinical signs and symptoms consistent with COVID-19. An individual without symptoms o f COVID-19 and who is not shedding SARS-CoV-2 virus would expect to have a negative (not detected) result in this assay.   Culture, blood (routine x 2)     Status: None (Preliminary result)   Collection Time: 11/18/18  8:45 PM   Specimen: BLOOD  Result Value Ref Range Status   Specimen Description BLOOD LEFT ANTECUBITAL  Final   Special Requests   Final    BOTTLES DRAWN AEROBIC AND ANAEROBIC Blood Culture results may not be optimal due to an excessive volume of blood received in culture bottles   Culture   Final    NO GROWTH < 12 HOURS Performed at [facility], [address] [place], [place] [address]    Report Status PENDING  Incomplete  Culture, blood (routine x 2)     Status: None (Preliminary result)   Collection Time: 11/18/18  8:48 PM   Specimen: BLOOD  Result Value Ref Range Status   Specimen Description BLOOD RIGHT ANTECUBITAL  Final   Special Requests   Final    BOTTLES DRAWN AEROBIC  AND ANAEROBIC Blood Culture adequate volume   Culture   Final    NO GROWTH < 12 HOURS Performed at [facility], [address] [place], [place] [address]    Report Status PENDING  Incomplete  SARS Coronavirus 2 [facility] order, Performed in [facility] hospital lab) Nasopharyngeal Nasopharyngeal Swab     Status: None   Collection Time: 11/18/18  8:48 PM   Specimen: Nasopharyngeal Swab  Result Value Ref Range Status   SARS Coronavirus 2 NEGATIVE NEGATIVE Final    Comment: (NOTE) If result is NEGATIVE SARS-CoV-2 target nucleic acids are NOT DETECTED. The SARS-CoV-2 RNA is generally detectable in upper and lower  respiratory specimens during the acute phase of infection. The lowest  concentration of SARS-CoV-2 viral  copies this assay can detect is 250  copies / mL. A negative result does not preclude SARS-CoV-2 infection  and should not be used as the sole basis for treatment or other  patient management decisions.  A negative result may occur with  improper specimen collection / handling, submission of specimen other  than nasopharyngeal swab, presence of viral mutation(s) within the  areas targeted by this assay, and inadequate number of viral copies  (<250 copies / mL). A negative result must be combined with clinical  observations, patient history, and epidemiological information. If result is POSITIVE SARS-CoV-2 target nucleic acids are DETECTED. The SARS-CoV-2 RNA is generally detectable in upper and lower  respiratory specimens dur ing the acute phase of infection.  Positive  results are indicative of active infection with SARS-CoV-2.  Clinical  correlation with patient history and other diagnostic information is  necessary to determine patient infection status.  Positive results do  not rule out bacterial infection or co-infection with other viruses. If result is PRESUMPTIVE POSTIVE SARS-CoV-2 nucleic acids MAY BE PRESENT.   A presumptive positive result was obtained on the submitted specimen  and confirmed on repeat testing.  While 2019 novel coronavirus  (SARS-CoV-2) nucleic acids may be present in the submitted sample  additional confirmatory testing may be necessary for epidemiological  and / or clinical management purposes  to differentiate between  SARS-CoV-2 and other Sarbecovirus currently known to infect humans.  If clinically indicated additional testing with an alternate test  methodology [phone]) is advised. The SARS-CoV-2 RNA is generally  detectable in upper and lower respiratory sp ecimens during the acute  phase of infection. The expected result is Negative. Fact Sheet for Patients:  StrictlyIdeas.no Fact Sheet for Healthcare  Providers: BankingDealers.co.za This test is not yet approved or cleared by the [place] FDA and has been authorized for detection and/or diagnosis of SARS-CoV-2 by FDA under an Emergency Use Authorization (EUA).  This EUA will remain in effect (meaning this test can be used) for the duration of the COVID-19 declaration under Section 564(b)(1) of the Act, 21 U.S.C. section 360bbb-3(b)(1), unless the authorization is terminated or revoked sooner. Performed at [facility], [address] [place], [place] [address]      Labs: Basic Metabolic Panel: Recent Labs  Lab 11/18/18 1658 11/19/18 0508  NA 141 139  K 3.3* 3.8  CL 105 107  CO2 27 25  GLUCOSE 100* 103*  BUN 18 13  CREATININE 0.74 0.70  CALCIUM 9.2 8.4*   CBC: Recent Labs  Lab 11/18/18 1658  WBC 7.7  NEUTROABS 5.4  HGB 13.0  HCT 40.9  MCV 99.3  PLT 179    Signed:  Barton Dubois MD.  [facility] 11/19/2018, 12:02 PM

## 2018-11-23 LAB — CULTURE, BLOOD (ROUTINE X 2)
Culture: NO GROWTH
Culture: NO GROWTH
Special Requests: ADEQUATE

## 2018-11-30 ENCOUNTER — Other Ambulatory Visit: Payer: Self-pay

## 2018-12-01 ENCOUNTER — Encounter: Payer: Self-pay | Admitting: Family Medicine

## 2018-12-01 ENCOUNTER — Other Ambulatory Visit (HOSPITAL_COMMUNITY): Payer: Medicare Other

## 2018-12-01 ENCOUNTER — Ambulatory Visit (INDEPENDENT_AMBULATORY_CARE_PROVIDER_SITE_OTHER): Payer: Medicare Other | Admitting: Family Medicine

## 2018-12-01 VITALS — BP 128/70 | HR 72 | Temp 98.3°F | Resp 14 | Ht 62.0 in | Wt 239.0 lb

## 2018-12-01 DIAGNOSIS — I482 Chronic atrial fibrillation, unspecified: Secondary | ICD-10-CM

## 2018-12-01 DIAGNOSIS — I10 Essential (primary) hypertension: Secondary | ICD-10-CM | POA: Diagnosis not present

## 2018-12-01 DIAGNOSIS — L84 Corns and callosities: Secondary | ICD-10-CM

## 2018-12-01 DIAGNOSIS — R609 Edema, unspecified: Secondary | ICD-10-CM | POA: Diagnosis not present

## 2018-12-01 DIAGNOSIS — L03116 Cellulitis of left lower limb: Secondary | ICD-10-CM | POA: Diagnosis not present

## 2018-12-01 DIAGNOSIS — R600 Localized edema: Secondary | ICD-10-CM | POA: Insufficient documentation

## 2018-12-01 DIAGNOSIS — Q845 Enlarged and hypertrophic nails: Secondary | ICD-10-CM

## 2018-12-01 MED ORDER — DOXYCYCLINE HYCLATE 100 MG PO TABS: 100.0000 mg | ORAL_TABLET | Freq: Two times a day (BID) | ORAL | 0 refills | Status: DC

## 2018-12-01 MED ORDER — FUROSEMIDE 20 MG PO TABS: 20.0000 mg | ORAL_TABLET | Freq: Every day | ORAL | 0 refills | Status: DC | PRN

## 2018-12-01 NOTE — Assessment & Plan Note (Signed)
Cellulitis of left lower extremity with swelling.  Amanda Mckay change her to doxycycline antibiotics 1 tablet twice a day will also give her Lasix 20 mg to take for a week see if this helps with the edema there is no underlying blood clot.  She does not have any significant pain but symptoms overall have not improved.  We will also get her set up with podiatry for the hypertrophic nails and tinea.

## 2018-12-01 NOTE — Patient Instructions (Addendum)
Take new antibiotic doxycycline Use lasix  Elevate your leg  Okay to use tylenol  Try the topical voltaren Podiatry refill  F/U as previous for CPE

## 2018-12-01 NOTE — Progress Notes (Signed)
Subjective:    Patient ID: Amanda Mckay, female    DOB: [birth year], 73 y.o.   MRN: [id number]  Patient presents for Hospital F/U (cellulitis)  Patient here for hospital follow-up.  She had a telephone visit for possible Covid symptoms on 9/1 that time she also mentions some redness between her toes.  Advised her to use topical antifungal. This actually spread and she was seen in the emergency room few days later with concern for cellulitis.  She was admitted overnight to be watched.  She was given IV cefazolin initially in the hospital and then discharged home with Keflex for 10 days. [place] neg for DVT He remained afebrile within the hospital had normal white blood cells at discharge.  Has underlying atrial fibrillation there was no problems with her rate control during her admission she continued on flecainide and metoprolol as well as aspirin  She did have some concern about her chart stating she had diastolic heart failure I do not see an echo do not see this in the last cardiology note not sure what this information came from.  Burning her leg she still has redness and swelling of her left lower extremity improved some with antibiotics but has not completely cleared.  She does have some cracks in the skin and some maceration between the toes as well as thick toenails.     Review Of Systems:  GEN- denies fatigue, fever, weight loss,weakness, recent illness HEENT- denies eye drainage, change in vision, nasal discharge, CVS- denies chest pain, palpitations RESP- denies SOB, cough, wheeze ABD- denies N/V, change in stools, abd pain GU- denies dysuria, hematuria, dribbling, incontinence MSK- denies joint pain, muscle aches, injury Neuro- denies headache, dizziness, syncope, seizure activity       Objective:    BP 128/70   Pulse 72   Temp 98.3 F (36.8 C) (Oral)   Resp 14   Ht 5\' 2"  (1.575 m)   Wt 239 lb (108.4 kg)   SpO2 97%   BMI 43.71 kg/m  GEN- NAD, alert and oriented  x3 HEENT- PERRL, EOMI, non injected sclera, pink conjunctiva, MMM, oropharynx clear Neck- Supple, no JVD CVS- RRR, no murmur RESP-CTAB ABD-NABS,soft,NT,ND EXT- + non pitting edema of LLE with erythema to mid shin, mild swellign over toes, maceration between toes left foot, callus, thick nails, legs NT Pulses- Radial, DP- 2+        Assessment & Plan:      Problem List Items Addressed This Visit      Unprioritized   Atrial fibrillation, chronic    Paroxysmal atrial fibrillation she is on aspirin therapy.      Relevant Medications   furosemide (LASIX) 20 MG tablet   Cellulitis of leg, left    Cellulitis of left lower extremity with swelling.  Emina change her to doxycycline antibiotics 1 tablet twice a day will also give her Lasix 20 mg to take for a week see if this helps with the edema there is no underlying blood clot.  She does not have any significant pain but symptoms overall have not improved.  We will also get her set up with podiatry for the hypertrophic nails and tinea.      Essential hypertension - Primary   Relevant Medications   furosemide (LASIX) 20 MG tablet   Peripheral edema    Other Visit Diagnoses    Enlarged and hypertrophic nails       Relevant Orders   Ambulatory referral to Podiatry   Pre-ulcerative calluses  Relevant Orders   Ambulatory referral to Podiatry      Note: This dictation was prepared with Dragon dictation along with smaller phrase technology. Any transcriptional errors that result from this process are unintentional.

## 2018-12-01 NOTE — Assessment & Plan Note (Signed)
Paroxysmal atrial fibrillation she is on aspirin therapy.

## 2018-12-07 ENCOUNTER — Ambulatory Visit (INDEPENDENT_AMBULATORY_CARE_PROVIDER_SITE_OTHER): Payer: Medicare Other | Admitting: Podiatry

## 2018-12-07 ENCOUNTER — Encounter: Payer: Self-pay | Admitting: Podiatry

## 2018-12-07 ENCOUNTER — Other Ambulatory Visit: Payer: Self-pay

## 2018-12-07 DIAGNOSIS — M79674 Pain in right toe(s): Secondary | ICD-10-CM | POA: Diagnosis not present

## 2018-12-07 DIAGNOSIS — B351 Tinea unguium: Secondary | ICD-10-CM | POA: Insufficient documentation

## 2018-12-07 DIAGNOSIS — M79675 Pain in left toe(s): Secondary | ICD-10-CM | POA: Diagnosis not present

## 2018-12-07 NOTE — Progress Notes (Signed)
This patient presents to the office with chief complaint of long thick painful ingrowing toenails both feet.  She states these nails are painful walking and wearing her shoes.  Patient states she has difficulty cutting her nails due to arthritis.  She is presently been treated by her PCP for cellulitis in her left leg.  She she has been taking doxycycline by mouth.  She states that she did have redness and swelling noted in the third and fourth toes on the left foot prior to taking antibiotics.  She presents the office today for evaluation and treatment of her long painful nails.     Podiatric Exam: Vascular: dorsalis pedis and posterior tibial pulses are weakly  palpable bilateral. Capillary return is immediate. Temperature gradient is WNL. Skin turgor WNL  Sensorium: Normal Semmes Weinstein monofilament test. Normal tactile sensation bilaterally. Nail Exam: Pt has thick disfigured discolored nails with subungual debris noted bilateral entire nail hallux through fifth toenails.  Pincer nails noted  B/l. Ulcer Exam: There is no evidence of ulcer or pre-ulcerative changes or infection. Orthopedic Exam: Muscle tone and strength are WNL. No limitations in general ROM. No crepitus or effusions noted. Foot type and digits show no abnormalities. Bony prominences are unremarkable. Skin: No Porokeratosis. No infection or ulcers.  Healing skin noted in sulcus 3,4 toe left foot.  Diagnosis:  Onychomycosis,  Pincer nails  B/L.  , Pain in right toe, pain in left toes  Treatment & Plan Procedures and Treatment: Consent by patient was obtained for treatment procedures.   Debridement of mycotic and hypertrophic toenails, 1 through 5 bilateral and clearing of subungual debris. No ulceration, no infection noted.  Return Visit-Office Procedure: Patient instructed to return to the office for a follow up visit 3 months for continued evaluation and treatment.    Gardiner Barefoot DPM

## 2018-12-25 ENCOUNTER — Encounter: Payer: Medicare Other | Admitting: Family Medicine

## 2018-12-28 ENCOUNTER — Ambulatory Visit (INDEPENDENT_AMBULATORY_CARE_PROVIDER_SITE_OTHER): Payer: Medicare Other | Admitting: Family Medicine

## 2018-12-28 ENCOUNTER — Other Ambulatory Visit: Payer: Self-pay

## 2018-12-28 VITALS — BP 114/70 | HR 94 | Temp 98.2°F | Resp 18 | Ht 62.0 in | Wt 238.0 lb

## 2018-12-28 DIAGNOSIS — L03116 Cellulitis of left lower limb: Secondary | ICD-10-CM

## 2018-12-28 DIAGNOSIS — I872 Venous insufficiency (chronic) (peripheral): Secondary | ICD-10-CM

## 2018-12-28 DIAGNOSIS — B353 Tinea pedis: Secondary | ICD-10-CM | POA: Diagnosis not present

## 2018-12-28 MED ORDER — CLOTRIMAZOLE 1 % EX CREA: 1.0000 "application " | TOPICAL_CREAM | Freq: Two times a day (BID) | CUTANEOUS | 1 refills | Status: AC

## 2018-12-28 MED ORDER — CEPHALEXIN 500 MG PO CAPS: 500.0000 mg | ORAL_CAPSULE | Freq: Three times a day (TID) | ORAL | 0 refills | Status: DC

## 2018-12-28 MED ORDER — TERBINAFINE HCL 250 MG PO TABS: 250.0000 mg | ORAL_TABLET | Freq: Every day | ORAL | 0 refills | Status: DC

## 2018-12-28 NOTE — Progress Notes (Signed)
Subjective:    Patient ID: Amanda Mckay, female    DOB: 13-Nov-1945, 73 y.o.   MRN: [id number]  HPI Patient was admitted to the hospital in September with cellulitis.  She was discharged home on Keflex.  Shortly thereafter she had recurrent cellulitis in the left leg and this was treated with doxycycline.  On both occasions the cellulitis improved however it has quickly returned.  Now the patient has pain and swelling in her left anterior shin.  The skin there is erythematous, warm to the touch, and tender.  Patient believes that the portal of entry may be a fungal infection in between the toes on her left foot.  In the webspace between the fourth and fifth toes and between the third and fourth toes, the skin is macerated erythematous with fissures and cracks due to athlete's foot.  There is a blister on the plantar aspect of the fourth MTP joint this roughly 4 to 5 mm in diameter.  She also has pitting edema in the left leg that is trace up to the level of the mid shin.  This edema is not present in the right leg.  I believe that venous insufficiency could be causing portals of entry for the bacteria as well coupled with the fungal infection in the foot.  I believe all this could be causing microscopic openings allowing cellulitis to occur Past Medical History:  Diagnosis Date  . Arthritis   . Atrial fibrillation ([place])   . Cataract   . Dysrhythmia    AFib  . GERD (gastroesophageal reflux disease)   . Hemochromatosis   . Iron excess    Past Surgical History:  Procedure Laterality Date  . CESAREAN SECTION    . TONSILLECTOMY    . VENTRAL HERNIA REPAIR N/A 01/26/2018   Procedure: HERNIA REPAIR VENTRAL ADULT WITH MESH;  Surgeon: Virl Cagey, MD;  Location: [facility];  Service: General;  Laterality: N/A;   Current Outpatient Medications on File Prior to Visit  Medication Sig Dispense Refill  . acetaminophen (TYLENOL) 325 MG tablet Take 650 mg by mouth 2 (two) times daily as needed for  moderate pain or headache.     [name] aspirin 325 MG tablet Take 325 mg by mouth every evening.     . Calcium Carb-Cholecalciferol (CALCIUM 600+D3 PO) Take 1 tablet by mouth 2 (two) times daily.    . Cholecalciferol (D3-1000) 1000 units capsule Take 1,000 Units by mouth daily.    [name] doxycycline (VIBRA-TABS) 100 MG tablet Take 1 tablet (100 mg total) by mouth 2 (two) times daily. 14 tablet 0  . famotidine (PEPCID) 20 MG tablet Take 20 mg by mouth at bedtime. *May take additional tablet as needed    . flecainide (TAMBOCOR) 100 MG tablet TAKE ONE-HALF TABLET BY MOUTH TWO TIMES DAILY. (Patient taking differently: Take 50 mg by mouth 2 (two) times daily. ) 90 tablet 3  . furosemide (LASIX) 20 MG tablet Take 1 tablet (20 mg total) by mouth daily as needed. 30 tablet 0  . metoprolol tartrate (LOPRESSOR) 25 MG tablet TAKE ONE TABLET BY MOUTH TWICE A DAY (Patient taking differently: Take 25 mg by mouth 2 (two) times daily. ) 180 tablet 3  . trolamine salicylate (ASPERCREME) 10 % cream Apply 1 application topically daily as needed for muscle pain.     No current facility-administered medications on file prior to visit.    Allergies  Allergen Reactions  . Tetanus Toxoids Other (See Comments)    Reaction  occurred more than 40 yrs ago--does not remember what occurred.   Social History   Socioeconomic History  . Marital status: Married    Spouse name: Not on file  . Number of children: Not on file  . Years of education: Not on file  . Highest education level: Not on file  Occupational History  . Not on file  Social Needs  . Financial resource strain: Not on file  . Food insecurity    Worry: Not on file    Inability: Not on file  . Transportation needs    Medical: Not on file    Non-medical: Not on file  Tobacco Use  . Smoking status: Never Smoker  . Smokeless tobacco: Never Used  Substance and Sexual Activity  . Alcohol use: Yes    Alcohol/week: 2.0 standard drinks    Types: 2 Glasses of wine  per week  . Drug use: No  . Sexual activity: Yes  Lifestyle  . Physical activity    Days per week: Not on file    Minutes per session: Not on file  . Stress: Not on file  Relationships  . Social Herbalist on phone: Not on file    Gets together: Not on file    Attends religious service: Not on file    Active member of club or organization: Not on file    Attends meetings of clubs or organizations: Not on file    Relationship status: Not on file  . Intimate partner violence    Fear of current or ex partner: Not on file    Emotionally abused: Not on file    Physically abused: Not on file    Forced sexual activity: Not on file  Other Topics Concern  . Not on file  Social History Narrative   Retired Copywriter, advertising.   Married.   Has children 3. Grandchildren, 2.    Grew up in [place], [place].   Married for over 31 years.    Eats all food groups.   Just moved to [place], [place] in 8/18.      Review of Systems  All other systems reviewed and are negative.      Objective:   Physical Exam Constitutional:      Appearance: Normal appearance. She is obese.  Cardiovascular:     Rate and Rhythm: Normal rate and regular rhythm.     Heart sounds: Normal heart sounds.  Pulmonary:     Effort: Pulmonary effort is normal. No respiratory distress.     Breath sounds: Normal breath sounds. No wheezing, rhonchi or rales.  Musculoskeletal:     Left lower leg: She exhibits tenderness and swelling. Edema present.       Legs:     Left foot: Deformity present.       Feet:  Neurological:     Mental Status: She is alert.           Assessment & Plan:  Tinea pedis of left foot  Cellulitis of left lower extremity  Venous insufficiency of left leg  As I explained to the patient, we need to treat the cellulitis with Keflex 500 mg p.o. 3 times daily for 10 days.  However we also need to address the portals of entry that allow for the recurrent cellulitis.  I believe the  patient has 2 portals of entry.  I believe the erythema in the webspaces between her toes with the fissures due to athlete's foot could be a  portal of entry.  I also believe that the swelling in her leg due to chronic venous insufficiency and varicose veins could be another cause or portal of entry.  Therefore I recommended Lamisil 250 mg p.o. daily for 7 days to treat the athlete's foot in between her toes.  She can also use Lotrimin cream twice daily to treat that rash as well.  I also recommended the patient wear compression hose to help reduce the swelling in her legs.  Reassess with her primary care physician later this week

## 2018-12-30 ENCOUNTER — Encounter: Payer: Self-pay | Admitting: Family Medicine

## 2018-12-30 ENCOUNTER — Ambulatory Visit (INDEPENDENT_AMBULATORY_CARE_PROVIDER_SITE_OTHER): Payer: Medicare Other | Admitting: Family Medicine

## 2018-12-30 ENCOUNTER — Other Ambulatory Visit: Payer: Self-pay

## 2018-12-30 VITALS — BP 128/70 | HR 80 | Temp 98.5°F | Resp 14 | Ht 62.0 in | Wt 239.0 lb

## 2018-12-30 DIAGNOSIS — Z0000 Encounter for general adult medical examination without abnormal findings: Secondary | ICD-10-CM | POA: Diagnosis not present

## 2018-12-30 DIAGNOSIS — R609 Edema, unspecified: Secondary | ICD-10-CM

## 2018-12-30 DIAGNOSIS — Z23 Encounter for immunization: Secondary | ICD-10-CM

## 2018-12-30 DIAGNOSIS — E559 Vitamin D deficiency, unspecified: Secondary | ICD-10-CM

## 2018-12-30 DIAGNOSIS — I10 Essential (primary) hypertension: Secondary | ICD-10-CM

## 2018-12-30 DIAGNOSIS — I5032 Chronic diastolic (congestive) heart failure: Secondary | ICD-10-CM

## 2018-12-30 DIAGNOSIS — E782 Mixed hyperlipidemia: Secondary | ICD-10-CM

## 2018-12-30 DIAGNOSIS — L03116 Cellulitis of left lower limb: Secondary | ICD-10-CM

## 2018-12-30 MED ORDER — DOCUSATE SODIUM 100 MG PO CAPS: 100.0000 mg | ORAL_CAPSULE | Freq: Two times a day (BID) | ORAL | 2 refills | Status: DC

## 2018-12-30 MED ORDER — FUROSEMIDE 20 MG PO TABS: 20.0000 mg | ORAL_TABLET | Freq: Every day | ORAL | 2 refills | Status: DC | PRN

## 2018-12-30 NOTE — Progress Notes (Signed)
Patient ID: Amanda Mckay, female   DOB: Dec 22, 1945, 73 y.o.   MRN: [id number] Subjective:   Patient presents for Medicare Annual/Subsequent preventive examination.    PAF -  On eliquis   Heriditary Hemochromatisis monitoring iron stores and hemoglobin she has not had any difficulty many years.  Her liver function is also been normal.   Recurrent cellulitis she was seen by my partner 2 days ago.  She had a large blister on the bottom of her left foot she has known tinea pedis she started to have redness spread up the leg so she came in as this is same like she had cellulitis on last month.  She was started on Keflex the redness is already improved and the swelling started to go down.  The blister broke yesterday she is using terbinafine by mouth as well as Lamisil cream between the other macerated toes.  He asked about compression hose.  She also asked if she should proceed with her MRI of her right knee she was seen by orthopedics Dr. Braulio Conte in [place] secondary to worsening osteoarthritis of the knee.  Has been wearing her knee brace.  I did have her stop the diclofenac secondary to her heart failure renal failure and her cellulitis   Review Past Medical/Family/Social: per EMR    Risk Factors  Current exercise habits: none Dietary issues discussed:Yes  Cardiac risk factors: Obesity (BMI >= 30 kg/m2). CHF,HTN  Depression Screen  (Note: if answer to either of the following is "Yes", , a more complete depression screening is indicated)  Over the past two weeks, have you felt down, depressed or hopeless? No Over the past two weeks, have you felt little interest or pleasure in doing things? No Have you lost interest or pleasure in daily life? No Do you often feel hopeless? No Do you cry easily over simple problems? No   Activities of Daily Living  In your present state of health, do you have any difficulty performing the following activities?:  Driving? No  Managing money? No  Feeding  yourself? No  Getting from bed to chair? No  Climbing a flight of stairs? No  Preparing food and eating?: No  Bathing or showering? No  Getting dressed: No  Getting to the toilet? No  Using the toilet:No  Moving around from place to place: No  In the past year have you fallen or had a near fall?:No  Are you sexually active? No  Do you have more than one partner? No   Hearing Difficulties: No  Do you often ask people to speak up or repeat themselves? No  Do you experience ringing or noises in your ears? No Do you have difficulty understanding soft or whispered voices? No  Do you feel that you have a problem with memory? No Do you often misplace items? No  Do you feel safe at home? Yes  Cognitive Testing  Alert? Yes Normal Appearance?Yes  Oriented to person? Yes Place? Yes  Time? Yes  Recall of three objects? Yes  Can perform simple calculations? Yes  Displays appropriate judgment?Yes  Can read the correct time from a watch face?Yes   List the Names of Other Physician/Practitioners you currently use:  [facility]  Orthopedics  Dermatology   Screening Tests / Date Cologaurd-   UTD                  Zostavax  Mammogram UTD 2019, will do every 2 years  Influenza Vaccine  UTD Tetanus/tdap  GEN- denies fatigue, fever, weight loss,weakness, recent illness HEENT- denies eye drainage, change in vision, nasal discharge, CVS- denies chest pain, palpitations RESP- denies SOB, cough, wheeze ABD- denies N/V, change in stools, abd pain GU- denies dysuria, hematuria, dribbling, incontinence MSK- denies joint pain, muscle aches, injury Neuro- denies headache, dizziness, syncope, seizure activity  Physical: vitals reviewed  GEN- NAD, alert and oriented x3 HEENT- PERRL, EOMI, non injected sclera, pink conjunctiva, MMM, oropharynx clear, TM clear no effusion  Neck- Supple, no thryomegaly, no JVD  CVS- RRR, no murmur RESP-CTAB ABD-NABS,soft,NT,ND EXT- + non pitting edema of  LLE with  Mild erythema to mid shin, , maceration between toes left foot, callus, thick nails, legs , flattened blister at base of 4-5th digits   Pulses- Radial, DP- palpated   Assessment:    Annual wellness medicare exam   Plan:    During the course of the visit the patient was educated and counseled about appropriate screening and preventive services including:   Depression/audit C/Fall screen negative  PAF- currently controlled, BP controlled, f/u cardiology   Immunizations- Flu shot given, obtain records from [facility] for previous shots    Mammogram every 2 years, declines Bone Density was on fosamax in past does not like SE of the medication, does not wish to treat any bone disorder  OA knee- recommend she f/u ortho and have MRI done, can use topical NSAID, tylenol, sever pain ultram  CHF- currently compenated, lasix prn for edema, also given script for compression hose  Cellulitis LLE- improved, continue Keflex, Lamisil, much improved already    Hereditary Hemochromatosis check iron studies   Discussed advanced directives handout given  Pt FULL CODE at this time         Diet review for nutrition referral? Yes ____ Not Indicated __x__  Patient Instructions (the written plan) was given to the patient.  Medicare Attestation  I have personally reviewed:  The patient's medical and social history  Their use of alcohol, tobacco or illicit drugs  Their current medications and supplements  The patient's functional ability including ADLs,fall risks, home safety risks, cognitive, and hearing and visual impairment  Diet and physical activities  Evidence for depression or mood disorders  The patient's weight, height, BMI, and visual acuity have been recorded in the chart. I have made referrals, counseling, and provided education to the patient based on review of the above and I have provided the patient with a written personalized care plan for preventive  services.

## 2018-12-30 NOTE — Patient Instructions (Addendum)
We will call with lab results Release of records - [place]  Compression hose- take to [facility]  Take lasix for a week, then go back to as needed  Flu shot given For arthritis use the voltaren gel or the tylenol ( acetaminophen) due to your heart history, other meds, these options are safer F/U 4 months

## 2018-12-31 LAB — CBC WITH DIFFERENTIAL/PLATELET
Absolute Monocytes: 638 cells/uL (ref 200–950)
Basophils Absolute: 38 cells/uL (ref 0–200)
Basophils Relative: 0.5 %
Eosinophils Absolute: 458 cells/uL (ref 15–500)
Eosinophils Relative: 6.1 %
HCT: 41.5 % (ref 35.0–45.0)
Hemoglobin: 13.9 g/dL (ref 11.7–15.5)
Lymphs Abs: 1485 cells/uL (ref 850–3900)
MCH: 31.8 pg (ref 27.0–33.0)
MCHC: 33.5 g/dL (ref 32.0–36.0)
MCV: 95 fL (ref 80.0–100.0)
MPV: 10.1 fL (ref 7.5–12.5)
Monocytes Relative: 8.5 %
Neutro Abs: 4883 cells/uL (ref 1500–7800)
Neutrophils Relative %: 65.1 %
Platelets: 222 10*3/uL (ref 140–400)
RBC: 4.37 10*6/uL (ref 3.80–5.10)
RDW: 12.3 % (ref 11.0–15.0)
Total Lymphocyte: 19.8 %
WBC: 7.5 10*3/uL (ref 3.8–10.8)

## 2018-12-31 LAB — LIPID PANEL
Cholesterol: 206 mg/dL — ABNORMAL HIGH (ref <200)
HDL: 43 mg/dL — ABNORMAL LOW (ref >=50)
LDL Cholesterol (Calc): 127 mg/dL (calc) — ABNORMAL HIGH
Non-HDL Cholesterol (Calc): 163 mg/dL (calc) — ABNORMAL HIGH (ref <130)
Total CHOL/HDL Ratio: 4.8 (calc) (ref <5.0)
Triglycerides: 226 mg/dL — ABNORMAL HIGH (ref <150)

## 2018-12-31 LAB — COMPREHENSIVE METABOLIC PANEL
AG Ratio: 1.7 (calc) (ref 1.0–2.5)
ALT: 12 U/L (ref 6–29)
AST: 14 U/L (ref 10–35)
Albumin: 4 g/dL (ref 3.6–5.1)
Alkaline phosphatase (APISO): 70 U/L (ref 37–153)
BUN: 23 mg/dL (ref 7–25)
CO2: 28 mmol/L (ref 20–32)
Calcium: 9.5 mg/dL (ref 8.6–10.4)
Chloride: 105 mmol/L (ref 98–110)
Creat: 0.75 mg/dL (ref 0.60–0.93)
Globulin: 2.4 g/dL (calc) (ref 1.9–3.7)
Glucose, Bld: 91 mg/dL (ref 65–99)
Potassium: 4.1 mmol/L (ref 3.5–5.3)
Sodium: 141 mmol/L (ref 135–146)
Total Bilirubin: 0.5 mg/dL (ref 0.2–1.2)
Total Protein: 6.4 g/dL (ref 6.1–8.1)

## 2018-12-31 LAB — IRON,TIBC AND FERRITIN PANEL
%SAT: 44 % (calc) (ref 16–45)
Ferritin: 249 ng/mL (ref 16–288)
Iron: 112 ug/dL (ref 45–160)
TIBC: 257 mcg/dL (calc) (ref 250–450)

## 2018-12-31 LAB — VITAMIN D 25 HYDROXY (VIT D DEFICIENCY, FRACTURES): Vit D, 25-Hydroxy: 46 ng/mL (ref 30–100)

## 2019-01-04 ENCOUNTER — Ambulatory Visit (INDEPENDENT_AMBULATORY_CARE_PROVIDER_SITE_OTHER): Payer: Medicare Other | Admitting: Otolaryngology

## 2019-01-04 ENCOUNTER — Encounter (INDEPENDENT_AMBULATORY_CARE_PROVIDER_SITE_OTHER): Payer: Self-pay

## 2019-01-04 ENCOUNTER — Other Ambulatory Visit: Payer: Self-pay

## 2019-01-06 ENCOUNTER — Ambulatory Visit (HOSPITAL_COMMUNITY)
Admission: RE | Admit: 2019-01-06 | Discharge: 2019-01-06 | Disposition: A | Discharge status: Home / Self Care | Payer: Medicare Other | Source: Ambulatory Visit | Attending: Orthopedic Surgery | Admitting: Orthopedic Surgery

## 2019-01-06 ENCOUNTER — Other Ambulatory Visit: Payer: Self-pay

## 2019-01-06 DIAGNOSIS — S83241D Other tear of medial meniscus, current injury, right knee, subsequent encounter: Secondary | ICD-10-CM | POA: Insufficient documentation

## 2019-01-06 DIAGNOSIS — M1710 Unilateral primary osteoarthritis, unspecified knee: Secondary | ICD-10-CM | POA: Insufficient documentation

## 2019-01-11 ENCOUNTER — Telehealth: Payer: Self-pay | Admitting: Orthopedic Surgery

## 2019-01-11 NOTE — Telephone Encounter (Signed)
IMPRESSION: Advanced osteoarthritis about the knee is worst in the medial compartment.   Fracture of the weight-bearing subchondral bone plate of the medial femoral condyle appears acute or early subacute.   Large radial tear posterior horn of the medial meniscus with a gap between meniscal fragments of 1.2 cm. The body of the medial meniscus is severely degenerated and extruded peripherally.   Focal radial tear free edge of the body of the lateral meniscus.     Electronically Signed   By: Inge Rise M.D.   On: 01/07/2019 09:02   Offered tka / arthroscopy    She wants to talk and think about

## 2019-01-12 ENCOUNTER — Other Ambulatory Visit: Payer: Self-pay | Admitting: Orthopedic Surgery

## 2019-01-12 ENCOUNTER — Telehealth: Payer: Self-pay | Admitting: Orthopedic Surgery

## 2019-01-12 NOTE — Telephone Encounter (Signed)
Put in orders for SARK with medial and lateral meniscectomy Order for walker on your desk to sign, she will pick up I told her she would need it for at least a week after surgery.

## 2019-01-12 NOTE — Telephone Encounter (Signed)
I will call her to give her times.  He has allowed her choice, she chose arthroscopy

## 2019-01-12 NOTE — Telephone Encounter (Signed)
Patient called, states received call from Dr Aline Brochure last evening with her MRI results. States discussed surgery, and has decided to move forward with arthroscopy procedure, rather than total knee surgery. Please advise.

## 2019-01-14 ENCOUNTER — Other Ambulatory Visit: Payer: Self-pay | Admitting: Orthopedic Surgery

## 2019-01-18 ENCOUNTER — Telehealth: Payer: Self-pay | Admitting: Radiology

## 2019-01-18 NOTE — Telephone Encounter (Signed)
I spoke to her, advised, she is fine with this date. Message sent to [facility]

## 2019-01-18 NOTE — Telephone Encounter (Signed)
Left message for patient to call me back, will need to RS her surgery due to family emergency with Dr Aline Brochure, will put on Nov 12th.if this day is okay for her.

## 2019-01-19 ENCOUNTER — Other Ambulatory Visit: Payer: Self-pay

## 2019-01-19 ENCOUNTER — Encounter (HOSPITAL_COMMUNITY): Admission: RE | Admit: 2019-01-19 | Payer: Medicare Other | Source: Ambulatory Visit

## 2019-01-19 ENCOUNTER — Other Ambulatory Visit (HOSPITAL_COMMUNITY)
Admission: RE | Admit: 2019-01-19 | Discharge: 2019-01-19 | Disposition: A | Discharge status: Home / Self Care | Payer: Medicare Other | Source: Ambulatory Visit | Attending: Orthopedic Surgery | Admitting: Orthopedic Surgery

## 2019-01-25 ENCOUNTER — Encounter (HOSPITAL_COMMUNITY): Payer: Self-pay

## 2019-01-26 ENCOUNTER — Other Ambulatory Visit (HOSPITAL_COMMUNITY)
Admission: RE | Admit: 2019-01-26 | Discharge: 2019-01-26 | Disposition: A | Discharge status: Home / Self Care | Payer: Medicare Other | Source: Ambulatory Visit | Attending: Orthopedic Surgery | Admitting: Orthopedic Surgery

## 2019-01-26 ENCOUNTER — Encounter (HOSPITAL_COMMUNITY)
Admission: RE | Admit: 2019-01-26 | Discharge: 2019-01-26 | Disposition: A | Discharge status: Home / Self Care | Payer: Medicare Other | Source: Ambulatory Visit | Attending: Orthopedic Surgery | Admitting: Orthopedic Surgery

## 2019-01-26 ENCOUNTER — Other Ambulatory Visit: Payer: Self-pay

## 2019-01-26 DIAGNOSIS — I10 Essential (primary) hypertension: Secondary | ICD-10-CM | POA: Diagnosis not present

## 2019-01-26 DIAGNOSIS — X58XXXA Exposure to other specified factors, initial encounter: Secondary | ICD-10-CM | POA: Diagnosis not present

## 2019-01-26 DIAGNOSIS — S83241A Other tear of medial meniscus, current injury, right knee, initial encounter: Secondary | ICD-10-CM | POA: Diagnosis not present

## 2019-01-26 DIAGNOSIS — Z01812 Encounter for preprocedural laboratory examination: Secondary | ICD-10-CM | POA: Insufficient documentation

## 2019-01-26 DIAGNOSIS — Z79899 Other long term (current) drug therapy: Secondary | ICD-10-CM | POA: Diagnosis not present

## 2019-01-26 DIAGNOSIS — Z20828 Contact with and (suspected) exposure to other viral communicable diseases: Secondary | ICD-10-CM | POA: Insufficient documentation

## 2019-01-26 DIAGNOSIS — Z7982 Long term (current) use of aspirin: Secondary | ICD-10-CM | POA: Diagnosis not present

## 2019-01-26 DIAGNOSIS — I4891 Unspecified atrial fibrillation: Secondary | ICD-10-CM | POA: Diagnosis not present

## 2019-01-26 DIAGNOSIS — K219 Gastro-esophageal reflux disease without esophagitis: Secondary | ICD-10-CM | POA: Diagnosis not present

## 2019-01-26 LAB — SARS CORONAVIRUS 2 (TAT 6-24 HRS): SARS Coronavirus 2: NEGATIVE

## 2019-01-27 NOTE — H&P (Signed)
Progress Note    Patient ID: Amanda Mckay, female   DOB: Apr 12, 1945, 74 y.o.   MRN: [id number]         Chief Complaint  Patient presents with  . Knee Pain      right          Encounter Diagnoses  Name Primary?  . Primary localized osteoarthritis of knee Yes  . Acute medial meniscus tear of right knee, subsequent encounter       73 year old female presents back with continued pain right knee problem   She was treated with Cataflam knee injection knee brace.  She says her pain is under control but she is having frequent giving way symptoms and trouble with side to side and pivoting maneuvers   73 year old female presents with history of osteoarthritis of the left knee and progressive arthritis over the last month in the right knee with diffuse pain which localizes to the medial joint line.  She has associated some locking and catching occasionally but no swelling.  When she is having severe pain is a 10 out of 10 it can be dull achy sharp and burning.   She denies any trauma   She does note use of a walking aid x1 month with prior treatment over-the-counter Aspercreme and Tylenol which is not effective   No change from 2/26 Review of Systems  Cardiovascular:       History of atrial fibrillation no symptoms  Musculoskeletal: Positive for joint pain.  All other systems reviewed and are negative.         BP (!) 152/65   Pulse 81   Temp (!) 97.3 F (36.3 C)   Ht 5\' 2"  (1.575 m)   Wt 240 lb (108.9 kg)   BMI 43.90 kg/m    Physical Exam Vitals signs and nursing note reviewed.  Constitutional:      Appearance: Normal appearance.  Musculoskeletal:     Comments: Amanda Mckay still requiring the use of a cane.  She does have medial joint line tenderness and pain with rotation of the knee and McMurray's maneuver her range of motion is somewhat diminished and she still has a limp with positive McMurray sign  Neurological:     Mental Status: She is alert and oriented to person, place, and  time.  Psychiatric:        Mood and Affect: Mood normal.          Medical decisions:  (Established problem worse, x-ray ,physical therapy, over-the-counter medicines, read outside film or summarize x-ray)   Data   Imaging: CLINICAL DATA:  Anterior right knee pain for 1 year. No known injury.   EXAM: MRI OF THE RIGHT KNEE WITHOUT CONTRAST   TECHNIQUE: Multiplanar, multisequence MR imaging of the knee was performed. No intravenous contrast was administered.   COMPARISON:  Plain films right knee 05/13/2018.   FINDINGS: MENISCI   Medial meniscus: There is a very large radial tear in the posterior horn with a gap between meniscal fragments measuring 1.2 cm. The body is extruded peripherally and markedly degenerated.   Lateral meniscus: Focal blunting along the free edge of the central body is consistent with a radial tear as seen on image 19 of series 7.   LIGAMENTS   Cruciates:  Intact.   Collaterals:  Intact.   CARTILAGE   Patellofemoral: Markedly degenerated. Cartilage along the medial femoral trochlea and patellar facet is completely denuded.   Medial:  Severely thinned and degenerated.   Lateral:  Moderately severe  degeneration is seen.   Joint:  Small joint effusion.   Popliteal Fossa:  No Baker's cyst.   Extensor Mechanism:  Intact.   Bones: There is a fracture of the subchondral bone plate of the weight-bearing femoral condyle measuring approximately 2 cm transverse 2.2 cm AP. Subchondral bone is flattened and there is marked marrow edema in the condyle. Tricompartmental osteophytosis is present.   Other: None.   IMPRESSION: Advanced osteoarthritis about the knee is worst in the medial compartment.   Fracture of the weight-bearing subchondral bone plate of the medial femoral condyle appears acute or early subacute.   Large radial tear posterior horn of the medial meniscus with a gap between meniscal fragments of 1.2 cm. The body of the  medial meniscus is severely degenerated and extruded peripherally.   Focal radial tear free edge of the body of the lateral meniscus.     Electronically Signed   By: Inge Rise M.D.   On: 01/07/2019 09:02    Prior x-ray shows varus osteoarthritis       Encounter Diagnoses  Name Primary?  . Primary localized osteoarthritis of knee Yes  . Acute medial meniscus tear of right knee, subsequent encounter       PLAN:   Arthroscopy medial lateral meniscectomy right knee

## 2019-01-28 ENCOUNTER — Encounter (HOSPITAL_COMMUNITY): Payer: Self-pay | Admitting: *Deleted

## 2019-01-28 ENCOUNTER — Ambulatory Visit (HOSPITAL_COMMUNITY): Payer: Medicare Other | Admitting: Anesthesiology

## 2019-01-28 ENCOUNTER — Ambulatory Visit (HOSPITAL_COMMUNITY)
Admission: RE | Admit: 2019-01-28 | Discharge: 2019-01-28 | Disposition: A | Discharge status: Home / Self Care | Payer: Medicare Other | Attending: Orthopedic Surgery | Admitting: Orthopedic Surgery

## 2019-01-28 ENCOUNTER — Other Ambulatory Visit: Payer: Self-pay

## 2019-01-28 ENCOUNTER — Encounter (HOSPITAL_COMMUNITY)
Admission: RE | Disposition: A | Discharge status: Home / Self Care | Payer: Self-pay | Source: Home / Self Care | Attending: Orthopedic Surgery

## 2019-01-28 DIAGNOSIS — Z79899 Other long term (current) drug therapy: Secondary | ICD-10-CM | POA: Insufficient documentation

## 2019-01-28 DIAGNOSIS — S83241A Other tear of medial meniscus, current injury, right knee, initial encounter: Secondary | ICD-10-CM | POA: Diagnosis not present

## 2019-01-28 DIAGNOSIS — I4891 Unspecified atrial fibrillation: Secondary | ICD-10-CM | POA: Insufficient documentation

## 2019-01-28 DIAGNOSIS — M1711 Unilateral primary osteoarthritis, right knee: Secondary | ICD-10-CM | POA: Diagnosis not present

## 2019-01-28 DIAGNOSIS — Z7982 Long term (current) use of aspirin: Secondary | ICD-10-CM | POA: Insufficient documentation

## 2019-01-28 DIAGNOSIS — I10 Essential (primary) hypertension: Secondary | ICD-10-CM | POA: Insufficient documentation

## 2019-01-28 DIAGNOSIS — M23321 Other meniscus derangements, posterior horn of medial meniscus, right knee: Secondary | ICD-10-CM

## 2019-01-28 DIAGNOSIS — X58XXXA Exposure to other specified factors, initial encounter: Secondary | ICD-10-CM | POA: Insufficient documentation

## 2019-01-28 DIAGNOSIS — K219 Gastro-esophageal reflux disease without esophagitis: Secondary | ICD-10-CM | POA: Diagnosis not present

## 2019-01-28 DIAGNOSIS — Z20828 Contact with and (suspected) exposure to other viral communicable diseases: Secondary | ICD-10-CM | POA: Insufficient documentation

## 2019-01-28 HISTORY — PX: KNEE ARTHROSCOPY WITH MEDIAL MENISECTOMY: SHX5651

## 2019-01-28 SURGERY — ARTHROSCOPY, KNEE, WITH MEDIAL MENISCECTOMY
Anesthesia: General | Site: Knee | Laterality: Right

## 2019-01-28 MED ORDER — LACTATED RINGERS IV SOLN
INTRAVENOUS | Status: DC
  Administered 2019-01-28: 12:00:00 via INTRAVENOUS

## 2019-01-28 MED ORDER — 0.9 % SODIUM CHLORIDE (POUR BTL) OPTIME
TOPICAL | Status: DC | PRN
  Administered 2019-01-28: 1000 mL

## 2019-01-28 MED ORDER — PHENYLEPHRINE HCL (PRESSORS) 10 MG/ML IV SOLN
INTRAVENOUS | Status: DC | PRN
  Administered 2019-01-28: 40 ug via INTRAVENOUS
  Administered 2019-01-28: 80 ug via INTRAVENOUS

## 2019-01-28 MED ORDER — ONDANSETRON HCL 4 MG/2ML IJ SOLN
INTRAMUSCULAR | Status: AC
  Filled 2019-01-28: qty 2

## 2019-01-28 MED ORDER — ONDANSETRON HCL 4 MG/2ML IJ SOLN
4.0000 mg | Freq: Once | INTRAMUSCULAR | Status: AC
  Administered 2019-01-28: 4 mg via INTRAVENOUS
  Filled 2019-01-28: qty 2

## 2019-01-28 MED ORDER — MIDAZOLAM HCL 2 MG/2ML IJ SOLN: 0.5000 mg | Freq: Once | INTRAMUSCULAR | Status: DC | PRN

## 2019-01-28 MED ORDER — LIDOCAINE 2% (20 MG/ML) 5 ML SYRINGE
INTRAMUSCULAR | Status: AC
  Filled 2019-01-28: qty 10

## 2019-01-28 MED ORDER — PROMETHAZINE HCL 25 MG/ML IJ SOLN: 6.2500 mg | INTRAMUSCULAR | Status: DC | PRN

## 2019-01-28 MED ORDER — BUPIVACAINE-EPINEPHRINE (PF) 0.25% -1:200000 IJ SOLN
INTRAMUSCULAR | Status: DC | PRN
  Administered 2019-01-28: 60 mL via PERINEURAL

## 2019-01-28 MED ORDER — PROMETHAZINE HCL 12.5 MG PO TABS: 12.5000 mg | ORAL_TABLET | Freq: Four times a day (QID) | ORAL | 0 refills | Status: DC | PRN

## 2019-01-28 MED ORDER — FENTANYL CITRATE (PF) 250 MCG/5ML IJ SOLN
INTRAMUSCULAR | Status: AC
  Filled 2019-01-28: qty 5

## 2019-01-28 MED ORDER — PROPOFOL 10 MG/ML IV BOLUS
INTRAVENOUS | Status: AC
  Filled 2019-01-28: qty 20

## 2019-01-28 MED ORDER — HYDROMORPHONE HCL 1 MG/ML IJ SOLN: 0.2500 mg | INTRAMUSCULAR | Status: DC | PRN

## 2019-01-28 MED ORDER — SODIUM CHLORIDE 0.9 % IR SOLN
Status: DC | PRN
  Administered 2019-01-28 (×3): 3000 mL

## 2019-01-28 MED ORDER — CHLORHEXIDINE GLUCONATE 4 % EX LIQD: 60.0000 mL | Freq: Once | CUTANEOUS | Status: DC

## 2019-01-28 MED ORDER — ONDANSETRON HCL 4 MG/2ML IJ SOLN
INTRAMUSCULAR | Status: DC | PRN
  Administered 2019-01-28: 4 mg via INTRAVENOUS

## 2019-01-28 MED ORDER — HYDROCODONE-ACETAMINOPHEN 7.5-325 MG PO TABS: 1.0000 | ORAL_TABLET | Freq: Once | ORAL | Status: DC | PRN

## 2019-01-28 MED ORDER — CEFAZOLIN SODIUM-DEXTROSE 2-4 GM/100ML-% IV SOLN
2.0000 g | INTRAVENOUS | Status: AC
  Administered 2019-01-28: 13:00:00 2 g via INTRAVENOUS
  Filled 2019-01-28: qty 100

## 2019-01-28 MED ORDER — DEXAMETHASONE SODIUM PHOSPHATE 4 MG/ML IJ SOLN
INTRAMUSCULAR | Status: DC | PRN
  Administered 2019-01-28: 8 mg via INTRAVENOUS

## 2019-01-28 MED ORDER — FENTANYL CITRATE (PF) 100 MCG/2ML IJ SOLN
INTRAMUSCULAR | Status: DC | PRN
  Administered 2019-01-28 (×3): 50 ug via INTRAVENOUS

## 2019-01-28 MED ORDER — SUCCINYLCHOLINE CHLORIDE 200 MG/10ML IV SOSY
PREFILLED_SYRINGE | INTRAVENOUS | Status: AC
  Filled 2019-01-28: qty 20

## 2019-01-28 MED ORDER — PHENYLEPHRINE 40 MCG/ML (10ML) SYRINGE FOR IV PUSH (FOR BLOOD PRESSURE SUPPORT)
PREFILLED_SYRINGE | INTRAVENOUS | Status: AC
  Filled 2019-01-28: qty 20

## 2019-01-28 MED ORDER — LIDOCAINE HCL (CARDIAC) PF 100 MG/5ML IV SOSY
PREFILLED_SYRINGE | INTRAVENOUS | Status: DC | PRN
  Administered 2019-01-28: 50 mg via INTRAVENOUS

## 2019-01-28 MED ORDER — SUCCINYLCHOLINE CHLORIDE 200 MG/10ML IV SOSY
PREFILLED_SYRINGE | INTRAVENOUS | Status: DC | PRN
  Administered 2019-01-28: 120 mg via INTRAVENOUS

## 2019-01-28 MED ORDER — PROPOFOL 10 MG/ML IV BOLUS
INTRAVENOUS | Status: DC | PRN
  Administered 2019-01-28: 150 mg via INTRAVENOUS
  Administered 2019-01-28: 40 mg via INTRAVENOUS
  Administered 2019-01-28: 50 mg via INTRAVENOUS

## 2019-01-28 MED ORDER — BUPIVACAINE-EPINEPHRINE (PF) 0.25% -1:200000 IJ SOLN
INTRAMUSCULAR | Status: AC
  Filled 2019-01-28: qty 30

## 2019-01-28 MED ORDER — HYDROCODONE-ACETAMINOPHEN 5-325 MG PO TABS
1.0000 | ORAL_TABLET | Freq: Once | ORAL | Status: AC
  Administered 2019-01-28: 1 via ORAL
  Filled 2019-01-28: qty 1

## 2019-01-28 MED ORDER — HYDROCODONE-ACETAMINOPHEN 5-325 MG PO TABS: 1.0000 | ORAL_TABLET | ORAL | 0 refills | Status: AC | PRN

## 2019-01-28 SURGICAL SUPPLY — 46 items
BANDAGE ELASTIC 6 VELCRO NS (GAUZE/BANDAGES/DRESSINGS) ×3 IMPLANT
BLADE AGGRESSIVE PLUS 4.0 (BLADE) ×3 IMPLANT
BLADE SURG SZ11 CARB STEEL (BLADE) ×3 IMPLANT
CHLORAPREP W/TINT 26 (MISCELLANEOUS) ×3 IMPLANT
CLOTH BEACON ORANGE TIMEOUT ST (SAFETY) ×3 IMPLANT
COOLER CRYO IC GRAV AND TUBE (ORTHOPEDIC SUPPLIES) ×3 IMPLANT
COVER WAND RF STERILE (DRAPES) ×3 IMPLANT
CUFF CRYO KNEE18X23 MED (MISCELLANEOUS) ×3 IMPLANT
CUFF TOURN SGL QUICK 34 (TOURNIQUET CUFF) ×2
CUFF TRNQT CYL 34X4.125X (TOURNIQUET CUFF) ×1 IMPLANT
DECANTER SPIKE VIAL GLASS SM (MISCELLANEOUS) ×6 IMPLANT
GAUZE 4X4 16PLY RFD (DISPOSABLE) ×3 IMPLANT
GAUZE SPONGE 4X4 12PLY STRL (GAUZE/BANDAGES/DRESSINGS) ×3 IMPLANT
GAUZE SPONGE 4X4 16PLY XRAY LF (GAUZE/BANDAGES/DRESSINGS) ×3 IMPLANT
GAUZE XEROFORM 5X9 LF (GAUZE/BANDAGES/DRESSINGS) ×3 IMPLANT
GLOVE BIO SURGEON STRL SZ7 (GLOVE) ×3 IMPLANT
GLOVE BIOGEL PI IND STRL 7.0 (GLOVE) ×2 IMPLANT
GLOVE BIOGEL PI INDICATOR 7.0 (GLOVE) ×4
GLOVE SKINSENSE NS SZ8.0 LF (GLOVE) ×2
GLOVE SKINSENSE STRL SZ8.0 LF (GLOVE) ×1 IMPLANT
GLOVE SS N UNI LF 8.5 STRL (GLOVE) ×3 IMPLANT
GOWN STRL REUS W/ TWL LRG LVL3 (GOWN DISPOSABLE) ×1 IMPLANT
GOWN STRL REUS W/TWL LRG LVL3 (GOWN DISPOSABLE) ×2
GOWN STRL REUS W/TWL XL LVL3 (GOWN DISPOSABLE) ×3 IMPLANT
IV NS IRRIG 3000ML ARTHROMATIC (IV SOLUTION) ×9 IMPLANT
KIT BLADEGUARD II DBL (SET/KITS/TRAYS/PACK) ×3 IMPLANT
KIT TURNOVER CYSTO (KITS) ×3 IMPLANT
MANIFOLD NEPTUNE II (INSTRUMENTS) ×3 IMPLANT
MARKER SKIN DUAL TIP RULER LAB (MISCELLANEOUS) ×3 IMPLANT
NEEDLE HYPO 18GX1.5 BLUNT FILL (NEEDLE) ×3 IMPLANT
NEEDLE HYPO 21X1.5 SAFETY (NEEDLE) ×3 IMPLANT
NEEDLE SPNL 18GX3.5 QUINCKE PK (NEEDLE) ×3 IMPLANT
NS IRRIG 1000ML POUR BTL (IV SOLUTION) ×3 IMPLANT
PACK ARTHRO LIMB DRAPE STRL (MISCELLANEOUS) ×3 IMPLANT
PAD ABD 5X9 TENDERSORB (GAUZE/BANDAGES/DRESSINGS) ×3 IMPLANT
PAD ARMBOARD 7.5X6 YLW CONV (MISCELLANEOUS) ×3 IMPLANT
PADDING CAST COTTON 6X4 STRL (CAST SUPPLIES) ×3 IMPLANT
PADDING WEBRIL 6 STERILE (GAUZE/BANDAGES/DRESSINGS) ×3 IMPLANT
SET ARTHROSCOPY INST (INSTRUMENTS) ×3 IMPLANT
SET BASIN LINEN APH (SET/KITS/TRAYS/PACK) ×3 IMPLANT
SUT ETHILON 3 0 FSL (SUTURE) ×3 IMPLANT
SYR 10ML LL (SYRINGE) ×3 IMPLANT
SYR 30ML LL (SYRINGE) ×3 IMPLANT
TUBE CONNECTING 12'X1/4 (SUCTIONS) ×2
TUBE CONNECTING 12X1/4 (SUCTIONS) ×4 IMPLANT
TUBING ARTHRO INFLOW-ONLY STRL (TUBING) ×3 IMPLANT

## 2019-01-28 NOTE — Transfer of Care (Signed)
Immediate Anesthesia Transfer of Care Note  Patient: Amanda Mckay  Procedure(s) Performed: RIGHT KNEE ARTHROSCOPY WITH MEDIAL MENISCECTOMY (Right Knee)  Patient Location: PACU  Anesthesia Type:General  Level of Consciousness: awake, alert  and patient cooperative  Airway & Oxygen Therapy: Patient Spontanous Breathing  Post-op Assessment: Report given to RN and Post -op Vital signs reviewed and stable  Post vital signs: Reviewed and stable  Last Vitals:  Vitals Value Taken Time  BP 136/73 01/28/19 1411  Temp    Pulse 78 01/28/19 1413  Resp 16 01/28/19 1413  SpO2 94 % 01/28/19 1413  Vitals shown include unvalidated device data.  Last Pain:  Vitals:   01/28/19 1205  TempSrc: Oral  PainSc: 0-No pain         Complications: No apparent anesthesia complications

## 2019-01-28 NOTE — Discharge Instructions (Signed)

## 2019-01-28 NOTE — Anesthesia Preprocedure Evaluation (Addendum)
Anesthesia Evaluation  Patient identified by MRN, date of birth, ID band Patient awake    Reviewed: Allergy & Precautions, NPO status , Patient's Chart, lab work & pertinent test results, reviewed documented beta blocker date and time   Airway Mallampati: II  TM Distance: >3 FB Neck ROM: Full    Dental no notable dental hx. (+) Teeth Intact   Pulmonary neg pulmonary ROS,    Pulmonary exam normal breath sounds clear to auscultation       Cardiovascular Exercise Tolerance: Poor hypertension, Normal cardiovascular exam+ dysrhythmias Atrial Fibrillation II Rhythm:Regular Rate:Normal  States limited ET Known h/o Afib on flecainide/toprol/ASA- mostly sinus now  Has had intermittant BLE swelling and cellulitis States she has read CHF on her paperwork but never had an Echo - using lasix    Neuro/Psych negative neurological ROS  negative psych ROS   GI/Hepatic Neg liver ROS, GERD  Medicated and Controlled,  Endo/Other  negative endocrine ROS  Renal/GU negative Renal ROS  negative genitourinary   Musculoskeletal  (+) Arthritis , Osteoarthritis,    Abdominal   Peds negative pediatric ROS (+)  Hematology negative hematology ROS (+)   Anesthesia Other Findings   Reproductive/Obstetrics negative OB ROS                             Anesthesia Physical Anesthesia Plan  ASA: III  Anesthesia Plan: General   Post-op Pain Management:    Induction: Intravenous  PONV Risk Score and Plan: 3 and Ondansetron and Treatment may vary due to age or medical condition  Airway Management Planned: Oral ETT  Additional Equipment:   Intra-op Plan:   Post-operative Plan: Extubation in OR  Informed Consent: I have reviewed the patients History and Physical, chart, labs and discussed the procedure including the risks, benefits and alternatives for the proposed anesthesia with the patient or authorized  representative who has indicated his/her understanding and acceptance.     Dental advisory given  Plan Discussed with: CRNA  Anesthesia Plan Comments: (Plan Full PPE use Plan GETA D/W PT -WTP with same after Q&A)        Anesthesia Quick Evaluation

## 2019-01-28 NOTE — Brief Op Note (Signed)
01/28/2019  2:01 PM  PATIENT:  Amanda Mckay  73 y.o. female  PRE-OPERATIVE DIAGNOSIS:  Torn medial and lateral meniscus right knee  POST-OPERATIVE DIAGNOSIS:  Torn medial meniscus right knee, osteoarthritis  PROCEDURE:  Procedure(s): RIGHT KNEE ARTHROSCOPY WITH MEDIAL MENISCECTOMY (Right)   Findings:   Medial compartment torn medial meniscus grade 3 chondral arthritis medial femoral condyle  Notch normal ACL PCL  Lateral compartment normal lateral meniscus grade I chondromalacia lateral femoral condyle lateral tibial plateau  Patella and trochlea grade 1 and 2 chondral changes  SURGEON:  Surgeon(s) and Role:    Carole Civil, MD - Primary  Knee arthroscopy dictation  The patient was identified in the preoperative holding area using 2 approved identification mechanisms. The chart was reviewed and updated. The surgical site was confirmed as right knee and marked with an indelible marker.  The patient was taken to the operating room for anesthesia. After successful general anesthesia, Ancef was used as IV antibiotics.  The patient was placed in the supine position with the (right) the operative extremity in an arthroscopic leg holder and the opposite extremity in a padded leg holder.  The timeout was executed.  A lateral portal was established with an 11 blade and the scope was introduced into the joint. A diagnostic arthroscopy was performed in circumferential manner examining the entire knee joint. A medial portal was established and the diagnostic arthroscopy was repeated using a probe to palpate intra-articular structures as they were encountered.     The medial meniscus was resected using a duckbill forceps. The meniscal fragments were removed with a motorized shaver. The meniscus was balanced with a combination of a motorized shaver and a 50 ArthroCare wand until a stable rim was obtained.   The arthroscopic pump was placed on the wash mode and any excess debris  was removed from the joint using suction.  60 cc of Marcaine with epinephrine was injected through the arthroscope.  The portals were closed with 3-0 nylon suture.  A sterile bandage, Ace wrap and Cryo/Cuff was placed and the Cryo/Cuff was activated. The patient was taken to the recovery room in stable condition.   PHYSICIAN ASSISTANT:   ASSISTANTS: none   ANESTHESIA:   general  EBL:  10 mL   BLOOD ADMINISTERED:none  DRAINS: none   LOCAL MEDICATIONS USED:  BUPIVICAINE   SPECIMEN:  No Specimen  DISPOSITION OF SPECIMEN:  N/A  COUNTS:  YES  TOURNIQUET:  * Missing tourniquet times found for documented tourniquets in log: [id number] *  DICTATION: .Dragon Dictation  PLAN OF CARE: Discharge to home after PACU  PATIENT DISPOSITION:  PACU - hemodynamically stable.   Delay start of Pharmacological VTE agent (>24hrs) due to surgical blood loss or risk of bleeding: not applicable

## 2019-01-28 NOTE — Anesthesia Procedure Notes (Signed)
Procedure Name: Intubation Date/Time: 01/28/2019 1:07 PM Performed by: Georgeanne Nim, CRNA Pre-anesthesia Checklist: Patient identified, Emergency Drugs available, Suction available, Patient being monitored and Timeout performed Patient Re-evaluated:Patient Re-evaluated prior to induction Oxygen Delivery Method: Circle system utilized Preoxygenation: Pre-oxygenation with 100% oxygen Induction Type: IV induction and Rapid sequence Laryngoscope Size: Mac and 4 Grade View: Grade II Tube type: Oral Tube size: 7.5 mm Number of attempts: 1 Airway Equipment and Method: Stylet Placement Confirmation: ETT inserted through vocal cords under direct vision,  positive ETCO2,  CO2 detector and breath sounds checked- equal and bilateral Secured at: 20 cm Tube secured with: Tape

## 2019-01-28 NOTE — Anesthesia Postprocedure Evaluation (Signed)
Anesthesia Post Note  Patient: Amanda Mckay  Procedure(s) Performed: RIGHT KNEE ARTHROSCOPY WITH MEDIAL MENISCECTOMY (Right Knee)  Patient location during evaluation: PACU Anesthesia Type: General Level of consciousness: awake and awake and alert Pain management: pain level controlled Vital Signs Assessment: post-procedure vital signs reviewed and stable Respiratory status: spontaneous breathing and nonlabored ventilation Cardiovascular status: blood pressure returned to baseline Postop Assessment: no headache Anesthetic complications: no     Last Vitals:  Vitals:   01/28/19 1449 01/28/19 1504  BP: 140/70   Pulse: 72 78  Resp: 18 20  Temp: 36.6 C   SpO2: 99% 96%    Last Pain:  Vitals:   01/28/19 1449  TempSrc: Oral  PainSc: 0-No pain                 Talbert Forest Ellanor Feuerstein

## 2019-01-28 NOTE — Op Note (Signed)
01/28/2019  2:01 PM  PATIENT:  Amanda Mckay  73 y.o. female  PRE-OPERATIVE DIAGNOSIS:  Torn medial and lateral meniscus right knee  POST-OPERATIVE DIAGNOSIS:  Torn medial meniscus right knee, osteoarthritis  PROCEDURE:  Procedure(s): RIGHT KNEE ARTHROSCOPY WITH MEDIAL MENISCECTOMY (Right)   Findings:   Medial compartment torn medial meniscus grade 3 chondral arthritis medial femoral condyle  Notch normal ACL PCL  Lateral compartment normal lateral meniscus grade I chondromalacia lateral femoral condyle lateral tibial plateau  Patella and trochlea grade 1 and 2 chondral changes  SURGEON:  Surgeon(s) and Role:    Carole Civil, MD - Primary  Knee arthroscopy dictation  The patient was identified in the preoperative holding area using 2 approved identification mechanisms. The chart was reviewed and updated. The surgical site was confirmed as right knee and marked with an indelible marker.  The patient was taken to the operating room for anesthesia. After successful general anesthesia, Ancef was used as IV antibiotics.  The patient was placed in the supine position with the (right) the operative extremity in an arthroscopic leg holder and the opposite extremity in a padded leg holder.  The timeout was executed.  A lateral portal was established with an 11 blade and the scope was introduced into the joint. A diagnostic arthroscopy was performed in circumferential manner examining the entire knee joint. A medial portal was established and the diagnostic arthroscopy was repeated using a probe to palpate intra-articular structures as they were encountered.     The medial meniscus was resected using a duckbill forceps. The meniscal fragments were removed with a motorized shaver. The meniscus was balanced with a combination of a motorized shaver and a 50 ArthroCare wand until a stable rim was obtained.   The arthroscopic pump was placed on the wash mode and any excess debris  was removed from the joint using suction.  60 cc of Marcaine with epinephrine was injected through the arthroscope.  The portals were closed with 3-0 nylon suture.  A sterile bandage, Ace wrap and Cryo/Cuff was placed and the Cryo/Cuff was activated. The patient was taken to the recovery room in stable condition.   PHYSICIAN ASSISTANT:   ASSISTANTS: none   ANESTHESIA:   general  EBL:  10 mL   BLOOD ADMINISTERED:none  DRAINS: none   LOCAL MEDICATIONS USED:  BUPIVICAINE   SPECIMEN:  No Specimen  DISPOSITION OF SPECIMEN:  N/A  COUNTS:  YES  TOURNIQUET:  * Missing tourniquet times found for documented tourniquets in log: [id number] *  DICTATION: .Dragon Dictation  PLAN OF CARE: Discharge to home after PACU  PATIENT DISPOSITION:  PACU - hemodynamically stable.   Delay start of Pharmacological VTE agent (>24hrs) due to surgical blood loss or risk of bleeding: not applicable

## 2019-01-28 NOTE — Interval H&P Note (Signed)
History and Physical Interval Note:  01/28/2019 12:42 PM  BP 128/67   Pulse 80   Temp 98.3 F (36.8 C) (Oral)   Resp 18   SpO2 96%   Left leg rechecked for possible cellulitis.  The leg is swollen but there is no warmth erythema or tenderness to touch  Proceed with right knee surgery as listed below Amanda Mckay  has presented today for surgery, with the diagnosis of Torn medial and lateral meniscectomy right knee.  The various methods of treatment have been discussed with the patient and family. After consideration of risks, benefits and other options for treatment, the patient has consented to  Procedure(s): KNEE ARTHROSCOPY WITH MEDIAL MENISCECTOMY AND LATERAL MENISCECTOMY (Right) as a surgical intervention.  The patient's history has been reviewed, patient examined, no change in status, stable for surgery.  I have reviewed the patient's chart and labs.  Questions were answered to the patient's satisfaction.     [name]

## 2019-01-29 ENCOUNTER — Encounter (HOSPITAL_COMMUNITY): Payer: Self-pay | Admitting: Orthopedic Surgery

## 2019-02-05 ENCOUNTER — Ambulatory Visit (INDEPENDENT_AMBULATORY_CARE_PROVIDER_SITE_OTHER): Payer: Medicare Other | Admitting: Orthopedic Surgery

## 2019-02-05 ENCOUNTER — Encounter: Payer: Self-pay | Admitting: Orthopedic Surgery

## 2019-02-05 ENCOUNTER — Other Ambulatory Visit: Payer: Self-pay

## 2019-02-05 DIAGNOSIS — Z9889 Other specified postprocedural states: Secondary | ICD-10-CM

## 2019-02-05 NOTE — Patient Instructions (Signed)
Home exercises  Ice 4  X a day

## 2019-02-05 NOTE — Progress Notes (Signed)
Postop status post knee scope on January 28, 2019 Chief Complaint  Patient presents with  . Routine Post Op    right knee scope 01/28/2019    Khilyn is doing reasonably well she thinks that the meniscal signs that she had prior to surgery have resolved  Her postop diagnosis was torn medial meniscus right knee with osteoarthritis we did a right knee arthroscopy with medial meniscectomy we did find grade 3 chondral changes on the medial femoral condyle and then lateral compartment grade 1 changes with patella and trochlea grade 1 and 2 changes  Her wounds look good her knee is slightly swollen she has 90 degrees of flexion she is walking with a walker  She did have some hip posterior thigh and ankle pain which we relate to altered gait and irritation of sciatic nerve  Recommend home exercises and then follow-up for another postop visit in 5 weeks

## 2019-02-25 ENCOUNTER — Ambulatory Visit: Payer: Medicare Other | Admitting: Podiatry

## 2019-02-25 ENCOUNTER — Other Ambulatory Visit: Payer: Self-pay

## 2019-02-25 ENCOUNTER — Encounter: Payer: Self-pay | Admitting: Podiatry

## 2019-02-25 DIAGNOSIS — B351 Tinea unguium: Secondary | ICD-10-CM

## 2019-02-25 DIAGNOSIS — M79674 Pain in right toe(s): Secondary | ICD-10-CM | POA: Diagnosis not present

## 2019-02-25 DIAGNOSIS — M79675 Pain in left toe(s): Secondary | ICD-10-CM

## 2019-02-25 NOTE — Progress Notes (Signed)
Complaint:  Visit Type: Patient returns to my office for continued preventative foot care services. Complaint: Patient states" my nails have grown long and thick and become painful to walk and wear shoes" Patient has had knee surgery since last visit.. The patient presents for preventative foot care services.  Podiatric Exam: Vascular: dorsalis pedis and posterior tibial pulses are palpable bilateral. Capillary return is immediate. Temperature gradient is WNL. Skin turgor WNL  Sensorium: Normal Semmes Weinstein monofilament test. Normal tactile sensation bilaterally. Nail Exam: Pt has thick disfigured discolored nails with subungual debris noted bilateral entire nail hallux through fifth toenails.  Pincer hallux nails. Ulcer Exam: There is no evidence of ulcer or pre-ulcerative changes or infection. Orthopedic Exam: Muscle tone and strength are WNL. No limitations in general ROM. No crepitus or effusions noted. Foot type and digits show no abnormalities. Bony prominences are unremarkable. Skin: No Porokeratosis. No infection or ulcers  Diagnosis:  Onychomycosis, , Pain in right toe, pain in left toes  Treatment & Plan Procedures and Treatment: Consent by patient was obtained for treatment procedures.   Debridement of mycotic and hypertrophic toenails, 1 through 5 bilateral and clearing of subungual debris. No ulceration, no infection noted.  Return Visit-Office Procedure: Patient instructed to return to the office for a follow up visit 10 weeks  for continued evaluation and treatment.    Gardiner Barefoot DPM

## 2019-03-08 DIAGNOSIS — Z9889 Other specified postprocedural states: Secondary | ICD-10-CM | POA: Insufficient documentation

## 2019-03-10 ENCOUNTER — Encounter: Payer: Self-pay | Admitting: Orthopedic Surgery

## 2019-03-10 ENCOUNTER — Ambulatory Visit: Payer: Medicare Other

## 2019-03-10 ENCOUNTER — Other Ambulatory Visit: Payer: Self-pay

## 2019-03-10 ENCOUNTER — Ambulatory Visit (INDEPENDENT_AMBULATORY_CARE_PROVIDER_SITE_OTHER): Payer: Medicare Other | Admitting: Orthopedic Surgery

## 2019-03-10 VITALS — Temp 97.0°F | Ht 62.0 in | Wt 239.0 lb

## 2019-03-10 DIAGNOSIS — M4316 Spondylolisthesis, lumbar region: Secondary | ICD-10-CM | POA: Diagnosis not present

## 2019-03-10 DIAGNOSIS — M5410 Radiculopathy, site unspecified: Secondary | ICD-10-CM

## 2019-03-10 DIAGNOSIS — M25561 Pain in right knee: Secondary | ICD-10-CM

## 2019-03-10 DIAGNOSIS — M4126 Other idiopathic scoliosis, lumbar region: Secondary | ICD-10-CM

## 2019-03-10 DIAGNOSIS — Z9889 Other specified postprocedural states: Secondary | ICD-10-CM

## 2019-03-10 DIAGNOSIS — D1723 Benign lipomatous neoplasm of skin and subcutaneous tissue of right leg: Secondary | ICD-10-CM

## 2019-03-10 DIAGNOSIS — G8929 Other chronic pain: Secondary | ICD-10-CM

## 2019-03-10 DIAGNOSIS — M47816 Spondylosis without myelopathy or radiculopathy, lumbar region: Secondary | ICD-10-CM

## 2019-03-10 MED ORDER — DICLOFENAC POTASSIUM 50 MG PO TABS: 50.0000 mg | ORAL_TABLET | Freq: Two times a day (BID) | ORAL | 3 refills | Status: DC

## 2019-03-10 NOTE — Patient Instructions (Signed)
Order therapy   Resume diclofenac

## 2019-03-10 NOTE — Progress Notes (Signed)
Postop visit status post knee arthroscopy on January 28, 2019  Patient is complaining of pain in her right leg denies any back pain  Her right knee she says her symptoms have improved from prior to surgery and she is not really have any difficulty there.  Her pain is in the anterior compartment and anterolateral aspect of her right leg anterolateral and posterior thigh and hip  Review of systems bowel and bladder function remain intact  Past Medical History:  Diagnosis Date  . Arthritis   . Atrial fibrillation ([place])   . Cataract   . Dysrhythmia    AFib  . GERD (gastroesophageal reflux disease)   . Hemochromatosis   . Iron excess      Temp (!) 97 F (36.1 C)   Ht 5\' 2"  (1.575 m)   Wt 239 lb (108.4 kg)   BMI 43.71 kg/m   She is awake alert and oriented x3 mood and affect are normal  Right lower extremity right knee looks good portals look good she has some mild joint line tenderness no effusion strength is normal knee is stable  Physical Exam Musculoskeletal:     Lumbar back: Tenderness present. No swelling, edema, deformity, signs of trauma or spasms. Decreased range of motion. Negative right straight leg raise test and negative left straight leg raise test. Scoliosis present.       Legs:    Encounter Diagnoses  Name Primary?  . S/P right knee arthroscopy 01/28/2019   . Radicular pain of right lower extremity   . Chronic pain of right knee   . Lumbar spondylosis   . Other idiopathic scoliosis, lumbar region   . Spondylolisthesis, lumbar region Yes   X-ray shows spondylolisthesis L4 on 5 spondylosis scoliosis  Recommend physical therapy recommend continue diclofenac for back pain  Follow-up will be arranged

## 2019-03-15 ENCOUNTER — Other Ambulatory Visit: Payer: Self-pay | Admitting: Cardiovascular Disease

## 2019-03-22 ENCOUNTER — Ambulatory Visit (HOSPITAL_COMMUNITY): Payer: Medicare PPO | Attending: Orthopedic Surgery | Admitting: Physical Therapy

## 2019-03-22 ENCOUNTER — Encounter (HOSPITAL_COMMUNITY): Payer: Self-pay | Admitting: Physical Therapy

## 2019-03-22 ENCOUNTER — Other Ambulatory Visit: Payer: Self-pay

## 2019-03-22 DIAGNOSIS — M25662 Stiffness of left knee, not elsewhere classified: Secondary | ICD-10-CM | POA: Insufficient documentation

## 2019-03-22 DIAGNOSIS — M5441 Lumbago with sciatica, right side: Secondary | ICD-10-CM | POA: Insufficient documentation

## 2019-03-22 DIAGNOSIS — M25562 Pain in left knee: Secondary | ICD-10-CM | POA: Diagnosis not present

## 2019-03-22 DIAGNOSIS — R2689 Other abnormalities of gait and mobility: Secondary | ICD-10-CM | POA: Diagnosis not present

## 2019-03-22 DIAGNOSIS — R262 Difficulty in walking, not elsewhere classified: Secondary | ICD-10-CM | POA: Diagnosis not present

## 2019-03-22 NOTE — Therapy (Signed)
[place], [place], [address] Phone: [phone]   Fax:  [phone]  Physical Therapy Evaluation  Patient Details  Name: Amanda Mckay MRN: [id number] Date of Birth: 01/28/1946 Referring Provider (PT): [name] MD   Encounter Date: 03/22/2019  PT End of Session - 03/22/19 1000    Visit Number  1    Number of Visits  18    Date for PT Re-Evaluation  05/07/19    Authorization Type  [employer]/ [id number] (No auth needed)    Authorization Time Period  03/22/19- 05/07/19    PT Start Time  0945    PT Stop Time  1025    PT Time Calculation (min)  40 min    Equipment Utilized During Treatment  Other (comment)   Rollator; RT knee hinge brace   Activity Tolerance  Patient tolerated treatment well       Past Medical History:  Diagnosis Date  . Arthritis   . Atrial fibrillation ([place])   . Cataract   . Dysrhythmia    AFib  . GERD (gastroesophageal reflux disease)   . Hemochromatosis   . Iron excess     Past Surgical History:  Procedure Laterality Date  . CESAREAN SECTION    . KNEE ARTHROSCOPY WITH MEDIAL MENISECTOMY Right 01/28/2019   Procedure: RIGHT KNEE ARTHROSCOPY WITH MEDIAL MENISCECTOMY;  Surgeon: Carole Civil, MD;  Location: [facility];  Service: Orthopedics;  Laterality: Right;  . TONSILLECTOMY    . VENTRAL HERNIA REPAIR N/A 01/26/2018   Procedure: HERNIA REPAIR VENTRAL ADULT WITH MESH;  Surgeon: Virl Cagey, MD;  Location: [facility];  Service: General;  Laterality: N/A;    There were no vitals filed for this visit.   Subjective Assessment - 03/22/19 0955    Subjective  Patient presents to physical therapy with complaint of low back pain. Patient says she has had some issues with her back off and on over the years, but noticed onset of radiating pain in RLE after her recent surgery for her meniscus on 01/28/19. Patient says she had xrays which showed spondylolisthesis. Patient says on days  with cold weather her back and RT leg hurt more, and that it causes her trouble to move. Patient also notes that since onset of these sx her balance has decreased.    Limitations  Sitting;Lifting;Standing;Walking;House hold activities    How long can you stand comfortably?  <5 minutes    How long can you walk comfortably?  <5 minutes    Diagnostic tests  xrays    Patient Stated Goals  Not be in pain and be able to walk, do more and not be unbalanced    Currently in Pain?  Yes    Pain Score  8     Pain Location  Hip    Pain Orientation  Right;Lateral;Posterior    Pain Descriptors / Indicators  Aching;Dull    Pain Type  Acute pain    Pain Radiating Towards  RT ankle    Pain Onset  More than a month ago    Pain Frequency  Intermittent    Aggravating Factors   standing, bending, walking    Pain Relieving Factors  sitting    Effect of Pain on Daily Activities  Limits         [facility] PT Assessment - 03/22/19 0001      Assessment   Medical Diagnosis  Low back pain with RT radiculopathy    Referring  Provider (PT)  [name] MD    Onset Date/Surgical Date  01/31/19    Prior Therapy  No      Precautions   Precautions  Fall    Required Braces or Orthoses  Other Brace/Splint   RT knee hinge brace     Restrictions   Weight Bearing Restrictions  No      Balance Screen   Has the patient fallen in the past 6 months  No      [place] residence    Living Arrangements  Spouse/significant other      Prior Function   Level of Independence  Independent      Cognition   Overall Cognitive Status  Within Functional Limits for tasks assessed      Observation/Other Assessments   Focus on Therapeutic Outcomes (FOTO)   69% limited      Sensation   Light Touch  Appears Intact      ROM / Strength   AROM / PROM / Strength  AROM;Strength      AROM   AROM Assessment Site  Lumbar;Knee;Hip    Right/Left Hip  Right    Right/Left Knee  Right;Left     Right Knee Extension  12    Right Knee Flexion  90    Left Knee Extension  15    Left Knee Flexion  80      Strength   Strength Assessment Site  Hip;Knee;Ankle    Right/Left Hip  Right;Left    Right Hip Flexion  4/5    Right Hip Extension  4-/5    Right Hip ABduction  4-/5    Left Hip Flexion  4/5    Left Hip Extension  4/5    Left Hip ABduction  4/5    Right/Left Knee  Right;Left    Right Knee Flexion  4+/5    Right Knee Extension  4/5    Left Knee Flexion  4+/5    Left Knee Extension  4+/5    Right/Left Ankle  Right;Left    Right Ankle Dorsiflexion  4/5    Left Ankle Dorsiflexion  4/5      Special Tests    Special Tests  Lumbar    Lumbar Tests  Straight Leg Raise      Straight Leg Raise   Findings  Negative      Ambulation/Gait   Ambulation/Gait  Yes    Ambulation/Gait Assistance  6: Modified independent (Device/Increase time)    Assistive device  Rollator    Gait Pattern  Right flexed knee in stance;Poor foot clearance - left;Poor foot clearance - right;Decreased stride length;Decreased stance time - right;Trunk flexed    Ambulation Surface  Level;Indoor    Gait velocity  decreased      Balance   Balance Assessed  Yes      Static Standing Balance   Static Standing Balance -  Activities   Tandam Stance - Right Leg;Tandam Stance - Left Leg    Static Standing - Comment/# of Minutes  5 sec; 15 sec                Objective measurements completed on examination: See above findings.              PT Education - 03/22/19 [phone]    Education Details  Patient educated on evaluation findings, and POC    Person(s) Educated  Patient    Methods  Explanation  Comprehension  Verbalized understanding       PT Short Term Goals - 03/22/19 1229      PT SHORT TERM GOAL #1   Title  Patient will be independent with initial HEP to improve functional outcomes    Time  3    Period  Weeks    Status  New    Target Date  04/16/19        PT Long Term  Goals - 03/22/19 1230      PT LONG TERM GOAL #1   Title  Patient will improve FOTO score to <50% to indicate improvement in functional outcomes    Time  6    Period  Weeks    Status  New    Target Date  05/07/19      PT LONG TERM GOAL #2   Title  Patient will report at least 50% overall improvement in subjective complaint to indicate improvement in ability to perform ADLs.    Time  6    Period  Weeks    Status  New    Target Date  05/07/19      PT LONG TERM GOAL #3   Title  Patient will be able to maintain tandem stance >30 seconds on BLEs to improve stability and reduce risk for falls    Time  6    Period  Weeks    Status  New    Target Date  05/07/19      PT LONG TERM GOAL #4   Title  Patient will be able to perform stand x 5 in < 15 seconds to demonstrate improvement in functional mobility and reduced risk for falls.    Time  6    Period  Weeks    Status  New    Target Date  05/07/19             Plan - 03/22/19 1032    Clinical Impression Statement  Patient is a 74 y.o. female who presents to physical therapy with complaint of low back pain with RT side radiculopathy. Patient demonstrates decreased strength, ROM restriction, balance deficits and gait abnormalities which are likely contributing to symptoms of pain and are negatively impacting patient ability to perform ADLs and functional mobility tasks. Patient will benefit from skilled physical therapy services to address these deficits to reduce pain, improve level of function with ADLs, functional mobility tasks, and reduce risk for falls.    Examination-Activity Limitations  Squat;Stairs;Stand;Lift;Locomotion Level;Bend;Transfers    Examination-Participation Restrictions  Community Activity    Stability/Clinical Decision Making  Stable/Uncomplicated    Clinical Decision Making  Low    Rehab Potential  Good    PT Frequency  3x / week    PT Duration  6 weeks    PT Treatment/Interventions  ADLs/Self Care Home  Management;Cryotherapy;Moist Heat;Ultrasound;Functional mobility training;Stair training;Neuromuscular re-education;Balance training;Gait training;DME Instruction;Therapeutic exercise;Therapeutic activities;Electrical Stimulation;Patient/family education;Orthotic Fit/Training;Compression bandaging;Taping;Spinal Manipulations;Passive range of motion;Manual techniques;Joint Manipulations;Vasopneumatic Device    PT Next Visit Plan  Initiate treatment, review goals, issue HEP (seated ab brace, seated heel slide, sit to stand from elevated surface). Progress core and hip strength, as well as BLE mobility as able.    PT Home Exercise Plan  Issue at next visit    Consulted and Agree with Plan of Care  Patient       Patient will benefit from skilled therapeutic intervention in order to improve the following deficits and impairments:  Abnormal gait, Decreased balance, Decreased mobility, Difficulty walking,  Hypomobility, Improper body mechanics, Decreased range of motion, Decreased activity tolerance, Decreased strength, Pain, Impaired flexibility  Visit Diagnosis: Acute right-sided low back pain with right-sided sciatica  Other abnormalities of gait and mobility     Problem List Patient Active Problem List   Diagnosis Date Noted  . S/P right knee arthroscopy 01/28/2019 03/08/2019  . Derangement of posterior horn of medial meniscus of right knee   . Primary osteoarthritis of right knee   . Pain due to onychomycosis of toenails of both feet 12/07/2018  . Peripheral edema 12/01/2018  . Essential hypertension   . Chronic diastolic HF (heart failure) ([place])   . Cellulitis of leg, left 11/18/2018  . Ventral hernia without obstruction or gangrene 08/01/2017  . Mixed hyperlipidemia 02/27/2017  . Vitamin D deficiency 02/27/2017  . Hereditary hemochromatosis ([place]) 02/27/2017  . Atrial fibrillation, chronic 02/27/2017    12:35 PM, 03/22/19 Josue Hector PT DPT  Physical Therapist with [facility]  [phone]   [facility] [address] [place], [place], [address] Phone: [phone]   Fax:  [phone]  Name: Aeriona Weisbecker MRN: [id number] Date of Birth: 16-Oct-1945

## 2019-03-24 ENCOUNTER — Other Ambulatory Visit: Payer: Self-pay

## 2019-03-24 ENCOUNTER — Ambulatory Visit (HOSPITAL_COMMUNITY): Payer: Medicare PPO | Admitting: Physical Therapy

## 2019-03-24 DIAGNOSIS — M5441 Lumbago with sciatica, right side: Secondary | ICD-10-CM | POA: Diagnosis not present

## 2019-03-24 DIAGNOSIS — R2689 Other abnormalities of gait and mobility: Secondary | ICD-10-CM

## 2019-03-24 DIAGNOSIS — R262 Difficulty in walking, not elsewhere classified: Secondary | ICD-10-CM | POA: Diagnosis not present

## 2019-03-24 DIAGNOSIS — M25662 Stiffness of left knee, not elsewhere classified: Secondary | ICD-10-CM | POA: Diagnosis not present

## 2019-03-24 DIAGNOSIS — M25562 Pain in left knee: Secondary | ICD-10-CM | POA: Diagnosis not present

## 2019-03-24 NOTE — Patient Instructions (Addendum)
Prepared By: [place]  [place], [place]  Phone: [phone]    STEP 1    Seated Transversus Abdominis Bracing REPS: 10  SETS: 1  HOLD: 5  DAILY: 1  WEEKLY: 7 Setup Begin sitting in an upright position with your hands on your lower abdominals. Movement Slowly draw your navel in toward your spine, bracing your deep abdominal muscles. Hold, then relax and repeat. Tip Make sure to sit tall throughout the exercise. Avoid bending your trunk forward and do not hold your breath. STEP 1   STEP 2    Seated Knee Flexion Extension AROM REPS: 10  SETS: 1  HOLD: 3  DAILY: 1  WEEKLY: 7 complete exercise slowly and controlled with abdominal bracing to isolate leg movement.  Setup Begin sitting upright on the edge of a chair and your feet resting flat on the ground. Movement Slowly slide your foot forward as far as you can with your toes up. Then slowly slide your heel backward as far as you can. Tip Make sure to stay sitting upright during the exercise. Only bend your knee as far as you can without causing pain. STEP 1   STEP 2   STEP 3    Sit to Stand without Arm Support REPS: 10  SETS: 1  DAILY: 1  WEEKLY: 7 complete slowly and controlled  Setup Begin by sitting upright on a chair with your feet slightly wider than shoulder width apart. Movement Reach out with your arms and lean forward at your hips until your bottom starts to lift off the chair. Move your body into a standing upright position, then reverse the order of your movements to return to the starting position. Tip Make sure not to let your knees collapse inward during the exercise.

## 2019-03-24 NOTE — Therapy (Signed)
[place], [place], [address] Phone: [phone]   Fax:  [phone]  Physical Therapy Treatment  Patient Details  Name: Amanda Mckay MRN: [id number] Date of Birth: 1945/08/15 Referring Provider (PT): [name] MD   Encounter Date: 03/24/2019  PT End of Session - 03/24/19 1031    Visit Number  2    Number of Visits  18    Date for PT Re-Evaluation  05/07/19    Authorization Type  [employer]/ [employer] (No auth needed)    Authorization Time Period  03/22/19- 05/07/19    PT Start Time  0915    PT Stop Time  1000    PT Time Calculation (min)  45 min    Equipment Utilized During Treatment  Other (comment)   Rollator; RT knee hinge brace   Activity Tolerance  Patient tolerated treatment well       Past Medical History:  Diagnosis Date  . Arthritis   . Atrial fibrillation ([place])   . Cataract   . Dysrhythmia    AFib  . GERD (gastroesophageal reflux disease)   . Hemochromatosis   . Iron excess     Past Surgical History:  Procedure Laterality Date  . CESAREAN SECTION    . KNEE ARTHROSCOPY WITH MEDIAL MENISECTOMY Right 01/28/2019   Procedure: RIGHT KNEE ARTHROSCOPY WITH MEDIAL MENISCECTOMY;  Surgeon: Carole Civil, MD;  Location: [facility];  Service: Orthopedics;  Laterality: Right;  . TONSILLECTOMY    . VENTRAL HERNIA REPAIR N/A 01/26/2018   Procedure: HERNIA REPAIR VENTRAL ADULT WITH MESH;  Surgeon: Virl Cagey, MD;  Location: [facility];  Service: General;  Laterality: N/A;    There were no vitals filed for this visit.  Subjective Assessment - 03/24/19 0924    Subjective  pt states her knee and her back are hurting her.  Comes today using a AWW (with seat) with noted forward lean with ambulation.  Currently 6/10 for both radiating pain into Rt LE and the knee itself    Currently in Pain?  Yes    Pain Score  6     Pain Location  Leg    Pain Orientation  Right    Pain Descriptors /  Indicators  Radiating;Throbbing;Contraction                       [facility] Adult PT Treatment/Exercise - 03/24/19 0001      Knee/Hip Exercises: Seated   Heel Slides  Both;10 reps    Heel Slides Limitations  with abdomoninal bracing    Other Seated Knee/Hip Exercises  abdominal bracing 10X5"    Sit to Sand  10 reps;without UE support      Knee/Hip Exercises: Supine   Bridges  Both;10 reps             PT Education - 03/24/19 1022    Education Details  goal review, POC moving forward.  Initiation of HEP. upright posturing with ambulation    Person(s) Educated  Patient    Methods  Explanation;Demonstration;Tactile cues;Verbal cues;Handout    Comprehension  Verbalized understanding;Returned demonstration;Verbal cues required;Tactile cues required       PT Short Term Goals - 03/24/19 0943      PT SHORT TERM GOAL #1   Title  Patient will be independent with initial HEP to improve functional outcomes    Time  3    Period  Weeks    Status  On-going  Target Date  04/16/19        PT Long Term Goals - 03/24/19 0943      PT LONG TERM GOAL #1   Title  Patient will improve FOTO score to <50% to indicate improvement in functional outcomes    Time  6    Period  Weeks    Status  On-going      PT LONG TERM GOAL #2   Title  Patient will report at least 50% overall improvement in subjective complaint to indicate improvement in ability to perform ADLs.    Time  6    Period  Weeks    Status  On-going      PT LONG TERM GOAL #3   Title  Patient will be able to maintain tandem stance >30 seconds on BLEs to improve stability and reduce risk for falls    Time  6    Period  Weeks    Status  On-going      PT LONG TERM GOAL #4   Title  Patient will be able to perform stand x 5 in < 15 seconds to demonstrate improvement in functional mobility and reduced risk for falls.    Time  6    Period  Weeks    Status  On-going            Plan - 03/24/19 1024     Clinical Impression Statement  Noted reduced height of walker arms upon entering clinic. Adjusted these to promote more upright posturing.  Reviewed goals and POC moving foward.  Began HEP instruction and given writeen instructions and folder. Pt able to complete without pain, however required cues to complete abdominal contraction appropriatedly without holding her breath and difficulty with sit to stand due to LE weakness and instabiltiy upon standing.  Pt with general complaints of Rt heel pain as well and instructed with standing PF stretch on bottom step as leaving clinic.  Pt will need reinforcement of these and added to HEP.    Examination-Activity Limitations  Squat;Stairs;Stand;Lift;Locomotion Level;Bend;Transfers    Examination-Participation Restrictions  Community Activity    Stability/Clinical Decision Making  Stable/Uncomplicated    Rehab Potential  Good    PT Frequency  3x / week    PT Duration  6 weeks    PT Treatment/Interventions  ADLs/Self Care Home Management;Cryotherapy;Moist Heat;Ultrasound;Functional mobility training;Stair training;Neuromuscular re-education;Balance training;Gait training;DME Instruction;Therapeutic exercise;Therapeutic activities;Electrical Stimulation;Patient/family education;Orthotic Fit/Training;Compression bandaging;Taping;Spinal Manipulations;Passive range of motion;Manual techniques;Joint Manipulations;Vasopneumatic Device    PT Next Visit Plan  Review HEP.  Add LE stretches and give for HEP (PF stretch on step).  Begin standing strengtheing and balance actvities (pt does not do well in supine due to stomach issues).    PT Home Exercise Plan  03/24/19: eated heelslides, abdominal bracing, sit to stands    Consulted and Agree with Plan of Care  Patient       Patient will benefit from skilled therapeutic intervention in order to improve the following deficits and impairments:  Abnormal gait, Decreased balance, Decreased mobility, Difficulty walking,  Hypomobility, Improper body mechanics, Decreased range of motion, Decreased activity tolerance, Decreased strength, Pain, Impaired flexibility  Visit Diagnosis: Acute right-sided low back pain with right-sided sciatica  Other abnormalities of gait and mobility  Acute pain of left knee     Problem List Patient Active Problem List   Diagnosis Date Noted  . S/P right knee arthroscopy 01/28/2019 03/08/2019  . Derangement of posterior horn of medial meniscus of right knee   .  Primary osteoarthritis of right knee   . Pain due to onychomycosis of toenails of both feet 12/07/2018  . Peripheral edema 12/01/2018  . Essential hypertension   . Chronic diastolic HF (heart failure) ([place])   . Cellulitis of leg, left 11/18/2018  . Ventral hernia without obstruction or gangrene 08/01/2017  . Mixed hyperlipidemia 02/27/2017  . Vitamin D deficiency 02/27/2017  . Hereditary hemochromatosis ([place]) 02/27/2017  . Atrial fibrillation, chronic 02/27/2017   Teena Irani, PTA/CLT [phone]   Teena Irani 03/24/2019, 10:31 AM  [place], [place], [address] Phone: [phone]   Fax:  [phone]  Name: Amanda Mckay MRN: [id number] Date of Birth: [birth year]

## 2019-03-26 ENCOUNTER — Ambulatory Visit (HOSPITAL_COMMUNITY): Payer: Medicare PPO | Admitting: Physical Therapy

## 2019-03-26 ENCOUNTER — Other Ambulatory Visit: Payer: Self-pay

## 2019-03-26 DIAGNOSIS — M25562 Pain in left knee: Secondary | ICD-10-CM | POA: Diagnosis not present

## 2019-03-26 DIAGNOSIS — R262 Difficulty in walking, not elsewhere classified: Secondary | ICD-10-CM

## 2019-03-26 DIAGNOSIS — M25662 Stiffness of left knee, not elsewhere classified: Secondary | ICD-10-CM

## 2019-03-26 DIAGNOSIS — R2689 Other abnormalities of gait and mobility: Secondary | ICD-10-CM | POA: Diagnosis not present

## 2019-03-26 DIAGNOSIS — M5441 Lumbago with sciatica, right side: Secondary | ICD-10-CM | POA: Diagnosis not present

## 2019-03-26 NOTE — Therapy (Signed)
[place], [place], [address] Phone: [phone]   Fax:  [phone]  Physical Therapy Treatment  Patient Details  Name: Amanda Mckay MRN: [id number] Date of Birth: October 17, 1945 Referring Provider (PT): [name] MD   Encounter Date: 03/26/2019    Past Medical History:  Diagnosis Date  . Arthritis   . Atrial fibrillation ([place])   . Cataract   . Dysrhythmia    AFib  . GERD (gastroesophageal reflux disease)   . Hemochromatosis   . Iron excess     Past Surgical History:  Procedure Laterality Date  . CESAREAN SECTION    . KNEE ARTHROSCOPY WITH MEDIAL MENISECTOMY Right 01/28/2019   Procedure: RIGHT KNEE ARTHROSCOPY WITH MEDIAL MENISCECTOMY;  Surgeon: Carole Civil, MD;  Location: [facility];  Service: Orthopedics;  Laterality: Right;  . TONSILLECTOMY    . VENTRAL HERNIA REPAIR N/A 01/26/2018   Procedure: HERNIA REPAIR VENTRAL ADULT WITH MESH;  Surgeon: Virl Cagey, MD;  Location: [facility];  Service: General;  Laterality: N/A;    There were no vitals filed for this visit.  Subjective Assessment - 03/26/19 0948    Subjective  pt states she was sore after last session but continued with her HEP.  States she is 8/10 pain today mostly in her knee.    Currently in Pain?  Yes    Pain Score  8     Pain Location  Knee    Pain Orientation  Right    Pain Descriptors / Indicators  Aching;Throbbing                       [facility] Adult PT Treatment/Exercise - 03/26/19 0001      Knee/Hip Exercises: Standing   Heel Raises  Both;10 reps    Hip Abduction  Both;10 reps    Hip Extension  10 reps;Both      Knee/Hip Exercises: Seated   Long Arc Quad  Both;10 reps    Heel Slides  Both;10 reps    Heel Slides Limitations  with abdomoninal bracing    Other Seated Knee/Hip Exercises  abdominal bracing 10X5"    Other Seated Knee/Hip Exercises  toeraise    Sit to Sand  2 sets;5 reps;without UE support    2 inch black foam without UE assist in chair              PT Short Term Goals - 03/24/19 0943      PT SHORT TERM GOAL #1   Title  Patient will be independent with initial HEP to improve functional outcomes    Time  3    Period  Weeks    Status  On-going    Target Date  04/16/19        PT Long Term Goals - 03/24/19 0943      PT LONG TERM GOAL #1   Title  Patient will improve FOTO score to <50% to indicate improvement in functional outcomes    Time  6    Period  Weeks    Status  On-going      PT LONG TERM GOAL #2   Title  Patient will report at least 50% overall improvement in subjective complaint to indicate improvement in ability to perform ADLs.    Time  6    Period  Weeks    Status  On-going      PT LONG TERM GOAL #3   Title  Patient  will be able to maintain tandem stance >30 seconds on BLEs to improve stability and reduce risk for falls    Time  6    Period  Weeks    Status  On-going      PT LONG TERM GOAL #4   Title  Patient will be able to perform stand x 5 in < 15 seconds to demonstrate improvement in functional mobility and reduced risk for falls.    Time  6    Period  Weeks    Status  On-going            Plan - 03/26/19 1044    Clinical Impression Statement  continued with progression of therex to improve strength and balance.  Pt requires cues for posture and form with therex.  Pt continues to lean on walker with forward bent posturing with ambulation with assistance of knee brace.  Added standing exercises this session without pain voiced.  Cues for form and inabliity to complete toeraises in standing secondary to weakness.  Pt required use of 2 inch (black) foam in chair to allow standing without UE assist.  Will progress this no foam as able.    Examination-Activity Limitations  Squat;Stairs;Stand;Lift;Locomotion Level;Bend;Transfers    Examination-Participation Restrictions  Community Activity    Stability/Clinical Decision Making   Stable/Uncomplicated    Rehab Potential  Good    PT Frequency  3x / week    PT Duration  6 weeks    PT Treatment/Interventions  ADLs/Self Care Home Management;Cryotherapy;Moist Heat;Ultrasound;Functional mobility training;Stair training;Neuromuscular re-education;Balance training;Gait training;DME Instruction;Therapeutic exercise;Therapeutic activities;Electrical Stimulation;Patient/family education;Orthotic Fit/Training;Compression bandaging;Taping;Spinal Manipulations;Passive range of motion;Manual techniques;Joint Manipulations;Vasopneumatic Device    PT Next Visit Plan  continue with  standing strengtheing and balance actvities (pt does not do well in supine due to stomach issues).  Next session begin tandem stance and vector stance.  Give seated toeraises and PF stretch on step for HEP    PT Home Exercise Plan  03/24/19: eated heelslides, abdominal bracing, sit to stands    Consulted and Agree with Plan of Care  Patient       Patient will benefit from skilled therapeutic intervention in order to improve the following deficits and impairments:  Abnormal gait, Decreased balance, Decreased mobility, Difficulty walking, Hypomobility, Improper body mechanics, Decreased range of motion, Decreased activity tolerance, Decreased strength, Pain, Impaired flexibility  Visit Diagnosis: Acute right-sided low back pain with right-sided sciatica  Other abnormalities of gait and mobility  Acute pain of left knee  Stiffness of left knee, not elsewhere classified  Difficulty in walking, not elsewhere classified     Problem List Patient Active Problem List   Diagnosis Date Noted  . S/P right knee arthroscopy 01/28/2019 03/08/2019  . Derangement of posterior horn of medial meniscus of right knee   . Primary osteoarthritis of right knee   . Pain due to onychomycosis of toenails of both feet 12/07/2018  . Peripheral edema 12/01/2018  . Essential hypertension   . Chronic diastolic HF (heart failure)  ([place])   . Cellulitis of leg, left 11/18/2018  . Ventral hernia without obstruction or gangrene 08/01/2017  . Mixed hyperlipidemia 02/27/2017  . Vitamin D deficiency 02/27/2017  . Hereditary hemochromatosis ([place]) 02/27/2017  . Atrial fibrillation, chronic 02/27/2017   Teena Irani, PTA/CLT [phone]  Teena Irani 03/26/2019, 10:54 AM  [place] [place], [place], [address] Phone: [phone]   Fax:  [phone]  Name: Eliene Franciosa MRN: [id number] Date  of Birth: Aug 01, 1945

## 2019-03-29 ENCOUNTER — Encounter (HOSPITAL_COMMUNITY): Payer: Self-pay | Admitting: Physical Therapy

## 2019-03-29 ENCOUNTER — Ambulatory Visit (HOSPITAL_COMMUNITY): Payer: Medicare PPO | Admitting: Physical Therapy

## 2019-03-29 ENCOUNTER — Other Ambulatory Visit: Payer: Self-pay

## 2019-03-29 DIAGNOSIS — R2689 Other abnormalities of gait and mobility: Secondary | ICD-10-CM | POA: Diagnosis not present

## 2019-03-29 DIAGNOSIS — M25662 Stiffness of left knee, not elsewhere classified: Secondary | ICD-10-CM | POA: Diagnosis not present

## 2019-03-29 DIAGNOSIS — M25562 Pain in left knee: Secondary | ICD-10-CM | POA: Diagnosis not present

## 2019-03-29 DIAGNOSIS — M5441 Lumbago with sciatica, right side: Secondary | ICD-10-CM

## 2019-03-29 DIAGNOSIS — R262 Difficulty in walking, not elsewhere classified: Secondary | ICD-10-CM | POA: Diagnosis not present

## 2019-03-29 NOTE — Therapy (Signed)
[place], [place], [address] Phone: [phone]   Fax:  [phone]  Physical Therapy Treatment  Patient Details  Name: Amanda Mckay MRN: [id number] Date of Birth: Nov 01, 1945 Referring Provider (PT): [name] MD   Encounter Date: 03/29/2019  PT End of Session - 03/29/19 0910    Visit Number  3    Number of Visits  18    Date for PT Re-Evaluation  05/07/19    Authorization Type  [employer]/ [employer] (No auth needed)    Authorization Time Period  03/22/19- 05/07/19    Authorization - Visit Number  3    Authorization - Number of Visits  10    PT Start Time  0905    PT Stop Time  0945    PT Time Calculation (min)  40 min    Equipment Utilized During Treatment  Other (comment);Gait belt   Rollator; RT knee hinge brace   Activity Tolerance  Patient tolerated treatment well;Patient limited by fatigue       Past Medical History:  Diagnosis Date  . Arthritis   . Atrial fibrillation ([place])   . Cataract   . Dysrhythmia    AFib  . GERD (gastroesophageal reflux disease)   . Hemochromatosis   . Iron excess     Past Surgical History:  Procedure Laterality Date  . CESAREAN SECTION    . KNEE ARTHROSCOPY WITH MEDIAL MENISECTOMY Right 01/28/2019   Procedure: RIGHT KNEE ARTHROSCOPY WITH MEDIAL MENISCECTOMY;  Surgeon: Carole Civil, MD;  Location: [facility];  Service: Orthopedics;  Laterality: Right;  . TONSILLECTOMY    . VENTRAL HERNIA REPAIR N/A 01/26/2018   Procedure: HERNIA REPAIR VENTRAL ADULT WITH MESH;  Surgeon: Virl Cagey, MD;  Location: [facility];  Service: General;  Laterality: N/A;    There were no vitals filed for this visit.  Subjective Assessment - 03/29/19 0908    Subjective  Patient says she is feeling pretty good today. She reports no new issues over the weekend, but does not some discomfort in her RT ankle that started after doing exercise on steps last visit. Patient reports no  pain currently.    Currently in Pain?  No/denies                       [facility] Adult PT Treatment/Exercise - 03/29/19 0001      Knee/Hip Exercises: Stretches   Gastroc Stretch  Both;3 reps;30 seconds    Gastroc Stretch Limitations  at incline       Knee/Hip Exercises: Standing   Heel Raises  Both;15 reps    Hip Abduction  Both;15 reps    Hip Extension  Both;15 reps    Forward Step Up  Both;10 reps;Hand Hold: 2;Step Height: 4"    Other Standing Knee Exercises  tandem stance, solid floor, 3 x 20"    Other Standing Knee Exercises  sidestepping; 1 RT      Knee/Hip Exercises: Seated   Long Arc Quad  Both;15 reps    Long Arc Quad Limitations  5 sec hold    Other Seated Knee/Hip Exercises  abdominal bracing 10X5"    Sit to Sand  2 sets;5 reps;10 reps;with UE support   1st set 5, 2nd set 10; with foam pad              PT Short Term Goals - 03/24/19 0943      PT SHORT TERM GOAL #1  Title  Patient will be independent with initial HEP to improve functional outcomes    Time  3    Period  Weeks    Status  On-going    Target Date  04/16/19        PT Long Term Goals - 03/24/19 0943      PT LONG TERM GOAL #1   Title  Patient will improve FOTO score to <50% to indicate improvement in functional outcomes    Time  6    Period  Weeks    Status  On-going      PT LONG TERM GOAL #2   Title  Patient will report at least 50% overall improvement in subjective complaint to indicate improvement in ability to perform ADLs.    Time  6    Period  Weeks    Status  On-going      PT LONG TERM GOAL #3   Title  Patient will be able to maintain tandem stance >30 seconds on BLEs to improve stability and reduce risk for falls    Time  6    Period  Weeks    Status  On-going      PT LONG TERM GOAL #4   Title  Patient will be able to perform stand x 5 in < 15 seconds to demonstrate improvement in functional mobility and reduced risk for falls.    Time  6    Period  Weeks     Status  On-going            Plan - 03/29/19 0944    Clinical Impression Statement  Added calf stretch per complaint of RT ankle stiffness. Patient required verbal cues for proper mechanics and foot placement with standing hip abduction and extension. Patient was well challenged with maintaining static balance during added tandem stance. Patient continues to require elevated seat with foam pad during sit to stands due to LE weakness. Patient noted increased muscle fatigue post treatment.    Examination-Activity Limitations  Squat;Stairs;Stand;Lift;Locomotion Level;Bend;Transfers    Examination-Participation Restrictions  Community Activity    Stability/Clinical Decision Making  Stable/Uncomplicated    Rehab Potential  Good    PT Frequency  3x / week    PT Duration  6 weeks    PT Treatment/Interventions  ADLs/Self Care Home Management;Cryotherapy;Moist Heat;Ultrasound;Functional mobility training;Stair training;Neuromuscular re-education;Balance training;Gait training;DME Instruction;Therapeutic exercise;Therapeutic activities;Electrical Stimulation;Patient/family education;Orthotic Fit/Training;Compression bandaging;Taping;Spinal Manipulations;Passive range of motion;Manual techniques;Joint Manipulations;Vasopneumatic Device    PT Next Visit Plan  Continue to progress core and BLE strength as tolerated. Try sit to stands with no UE assist, or removing foam pad next visit.    PT Home Exercise Plan  03/24/19: eated heelslides, abdominal bracing, sit to stands    Consulted and Agree with Plan of Care  Patient       Patient will benefit from skilled therapeutic intervention in order to improve the following deficits and impairments:  Abnormal gait, Decreased balance, Decreased mobility, Difficulty walking, Hypomobility, Improper body mechanics, Decreased range of motion, Decreased activity tolerance, Decreased strength, Pain, Impaired flexibility  Visit Diagnosis: Acute right-sided low back  pain with right-sided sciatica  Other abnormalities of gait and mobility  Acute pain of left knee  Stiffness of left knee, not elsewhere classified  Difficulty in walking, not elsewhere classified     Problem List Patient Active Problem List   Diagnosis Date Noted  . S/P right knee arthroscopy 01/28/2019 03/08/2019  . Derangement of posterior horn of medial meniscus of right knee   .  Primary osteoarthritis of right knee   . Pain due to onychomycosis of toenails of both feet 12/07/2018  . Peripheral edema 12/01/2018  . Essential hypertension   . Chronic diastolic HF (heart failure) ([place])   . Cellulitis of leg, left 11/18/2018  . Ventral hernia without obstruction or gangrene 08/01/2017  . Mixed hyperlipidemia 02/27/2017  . Vitamin D deficiency 02/27/2017  . Hereditary hemochromatosis ([place]) 02/27/2017  . Atrial fibrillation, chronic 02/27/2017   9:50 AM, 03/29/19 Josue Hector PT DPT  Physical Therapist with [facility]  [phone] [place] [address] [place], [place], [address] Phone: [phone]   Fax:  [phone]  Name: Amanda Mckay MRN: [id number] Date of Birth: 09-Jun-1945

## 2019-03-31 ENCOUNTER — Other Ambulatory Visit: Payer: Self-pay

## 2019-03-31 ENCOUNTER — Ambulatory Visit (HOSPITAL_COMMUNITY): Payer: Medicare PPO | Admitting: Physical Therapy

## 2019-03-31 ENCOUNTER — Encounter (HOSPITAL_COMMUNITY): Payer: Self-pay | Admitting: Physical Therapy

## 2019-03-31 DIAGNOSIS — R262 Difficulty in walking, not elsewhere classified: Secondary | ICD-10-CM | POA: Diagnosis not present

## 2019-03-31 DIAGNOSIS — M5441 Lumbago with sciatica, right side: Secondary | ICD-10-CM

## 2019-03-31 DIAGNOSIS — M25562 Pain in left knee: Secondary | ICD-10-CM

## 2019-03-31 DIAGNOSIS — M25662 Stiffness of left knee, not elsewhere classified: Secondary | ICD-10-CM | POA: Diagnosis not present

## 2019-03-31 DIAGNOSIS — R2689 Other abnormalities of gait and mobility: Secondary | ICD-10-CM

## 2019-03-31 NOTE — Therapy (Signed)
[place], [place], [address] Phone: [phone]   Fax:  [phone]  Physical Therapy Treatment  Patient Details  Name: Amanda Mckay MRN: [id number] Date of Birth: 1946-02-11 Referring Provider (PT): [name] MD   Encounter Date: 03/31/2019  PT End of Session - 03/31/19 0905    Visit Number  4    Number of Visits  18    Date for PT Re-Evaluation  05/07/19    Authorization Type  [employer]/ [employer] (No auth needed)    Authorization Time Period  03/22/19- 05/07/19    Authorization - Visit Number  4    Authorization - Number of Visits  10    PT Start Time  0900    PT Stop Time  0943    PT Time Calculation (min)  43 min    Equipment Utilized During Treatment  Other (comment);Gait belt   Rollator; RT knee hinge brace; SPC   Activity Tolerance  Patient tolerated treatment well;Patient limited by fatigue       Past Medical History:  Diagnosis Date  . Arthritis   . Atrial fibrillation ([place])   . Cataract   . Dysrhythmia    AFib  . GERD (gastroesophageal reflux disease)   . Hemochromatosis   . Iron excess     Past Surgical History:  Procedure Laterality Date  . CESAREAN SECTION    . KNEE ARTHROSCOPY WITH MEDIAL MENISECTOMY Right 01/28/2019   Procedure: RIGHT KNEE ARTHROSCOPY WITH MEDIAL MENISCECTOMY;  Surgeon: Carole Civil, MD;  Location: [facility];  Service: Orthopedics;  Laterality: Right;  . TONSILLECTOMY    . VENTRAL HERNIA REPAIR N/A 01/26/2018   Procedure: HERNIA REPAIR VENTRAL ADULT WITH MESH;  Surgeon: Virl Cagey, MD;  Location: [facility];  Service: General;  Laterality: N/A;    There were no vitals filed for this visit.  Subjective Assessment - 03/31/19 0903    Subjective  Patient says she is doing well today, says she notes mild muscle soreness after last time which is good. Patient says her RT ankle is still bothering her.    Currently in Pain?  Yes    Pain Score  8     Pain Location  Ankle    Pain Orientation  Right    Pain Descriptors / Indicators  Aching    Pain Type  Acute pain    Pain Onset  In the past 7 days    Pain Frequency  Intermittent                       [facility] Adult PT Treatment/Exercise - 03/31/19 0001      Knee/Hip Exercises: Stretches   Gastroc Stretch  Both;3 reps;30 seconds    Gastroc Stretch Limitations  at incline       Knee/Hip Exercises: Standing   Heel Raises  Both;15 reps    Hip Abduction  Both;15 reps    Hip Extension  Both;15 reps    Forward Step Up  Both;10 reps;Hand Hold: 2;Step Height: 6"    Gait Training  75 feet with SPC in LT, cued on seqeunce and progressing to step through pattern     Other Standing Knee Exercises  tandem stance, 1x 30" solid floor, 2x 30" foam pad    Other Standing Knee Exercises  sidestepping, 15 ft, 1RT; palloff press, red band 10 x ea      Knee/Hip Exercises: Seated   Long [employer]  Both;15 reps    Long Arc Quad Limitations  5 sec hold    Sit to [employer]  2 sets;5 reps;10 reps;with UE support;without UE support   1st set of 5, use of hands for initial reps; 2nd set, 10 rep              PT Short Term Goals - 03/24/19 0943      PT SHORT TERM GOAL #1   Title  Patient will be independent with initial HEP to improve functional outcomes    Time  3    Period  Weeks    Status  On-going    Target Date  04/16/19        PT Long Term Goals - 03/24/19 0943      PT LONG TERM GOAL #1   Title  Patient will improve FOTO score to <50% to indicate improvement in functional outcomes    Time  6    Period  Weeks    Status  On-going      PT LONG TERM GOAL #2   Title  Patient will report at least 50% overall improvement in subjective complaint to indicate improvement in ability to perform ADLs.    Time  6    Period  Weeks    Status  On-going      PT LONG TERM GOAL #3   Title  Patient will be able to maintain tandem stance >30 seconds on BLEs to improve stability and reduce  risk for falls    Time  6    Period  Weeks    Status  On-going      PT LONG TERM GOAL #4   Title  Patient will be able to perform stand x 5 in < 15 seconds to demonstrate improvement in functional mobility and reduced risk for falls.    Time  6    Period  Weeks    Status  On-going            Plan - 03/31/19 0943    Clinical Impression Statement  Patient noted discomfort in LT knee during hip abd on RLE stance. Had difficulty with sit to stand initially, requiring UE assist, but with improved forward momentum, was able to progress to 10 reps with no UE assist. Patient cued on eccentric lowering and control. Able to progress to 6 in step up with no increased pain. Patient tolerated balance progression well, able to use foam pad with tandem stance, requiring intermittent HHA. Progressed abdominal brace to bracing with palloff press. Patient educated on proper form and function. Began gait training with SPC today, patient cued on proper sequencing. Patient noted increased fatigue in BLEs.    Examination-Activity Limitations  Squat;Stairs;Stand;Lift;Locomotion Level;Bend;Transfers    Examination-Participation Restrictions  Community Activity    Stability/Clinical Decision Making  Stable/Uncomplicated    Rehab Potential  Good    PT Frequency  3x / week    PT Duration  6 weeks    PT Treatment/Interventions  ADLs/Self Care Home Management;Cryotherapy;Moist Heat;Ultrasound;Functional mobility training;Stair training;Neuromuscular re-education;Balance training;Gait training;DME Instruction;Therapeutic exercise;Therapeutic activities;Electrical Stimulation;Patient/family education;Orthotic Fit/Training;Compression bandaging;Taping;Spinal Manipulations;Passive range of motion;Manual techniques;Joint Manipulations;Vasopneumatic Device    PT Next Visit Plan  Continue to progress core and BLE strength as tolerated. Continue to progress gait training with [facility]    PT Home Exercise Plan  03/24/19: eated  heelslides, abdominal bracing, sit to stands    Consulted and Agree with Plan of Care  Patient       Patient will  benefit from skilled therapeutic intervention in order to improve the following deficits and impairments:  Abnormal gait, Decreased balance, Decreased mobility, Difficulty walking, Hypomobility, Improper body mechanics, Decreased range of motion, Decreased activity tolerance, Decreased strength, Pain, Impaired flexibility  Visit Diagnosis: Acute right-sided low back pain with right-sided sciatica  Other abnormalities of gait and mobility  Acute pain of left knee  Stiffness of left knee, not elsewhere classified  Difficulty in walking, not elsewhere classified     Problem List Patient Active Problem List   Diagnosis Date Noted  . S/P right knee arthroscopy 01/28/2019 03/08/2019  . Derangement of posterior horn of medial meniscus of right knee   . Primary osteoarthritis of right knee   . Pain due to onychomycosis of toenails of both feet 12/07/2018  . Peripheral edema 12/01/2018  . Essential hypertension   . Chronic diastolic HF (heart failure) ([place])   . Cellulitis of leg, left 11/18/2018  . Ventral hernia without obstruction or gangrene 08/01/2017  . Mixed hyperlipidemia 02/27/2017  . Vitamin D deficiency 02/27/2017  . Hereditary hemochromatosis ([place]) 02/27/2017  . Atrial fibrillation, chronic 02/27/2017   9:46 AM, 03/31/19 Josue Hector PT DPT  Physical Therapist with [facility]  [phone] [place], [place], [address] Phone: [phone]   Fax:  [phone]  Name: Amanda Mckay MRN: [id number] Date of Birth: 1946-01-14

## 2019-04-02 ENCOUNTER — Ambulatory Visit (HOSPITAL_COMMUNITY): Payer: Medicare PPO

## 2019-04-02 ENCOUNTER — Other Ambulatory Visit: Payer: Self-pay

## 2019-04-02 ENCOUNTER — Encounter (HOSPITAL_COMMUNITY): Payer: Self-pay

## 2019-04-02 DIAGNOSIS — M5441 Lumbago with sciatica, right side: Secondary | ICD-10-CM | POA: Diagnosis not present

## 2019-04-02 DIAGNOSIS — R262 Difficulty in walking, not elsewhere classified: Secondary | ICD-10-CM | POA: Diagnosis not present

## 2019-04-02 DIAGNOSIS — R2689 Other abnormalities of gait and mobility: Secondary | ICD-10-CM

## 2019-04-02 DIAGNOSIS — M25562 Pain in left knee: Secondary | ICD-10-CM | POA: Diagnosis not present

## 2019-04-02 DIAGNOSIS — M25662 Stiffness of left knee, not elsewhere classified: Secondary | ICD-10-CM | POA: Diagnosis not present

## 2019-04-02 NOTE — Therapy (Signed)
[place], [place], [address] Phone: [phone]   Fax:  [phone]  Physical Therapy Treatment  Patient Details  Name: Amanda Mckay MRN: [id number] Date of Birth: 1945-03-26 Referring Provider (PT): [name] MD   Encounter Date: 04/02/2019  PT End of Session - 04/02/19 1006    Visit Number  5    Number of Visits  18    Date for PT Re-Evaluation  05/07/19    Authorization Type  [employer]/ [employer] (No auth needed)    Authorization Time Period  03/22/19- 05/07/19    Authorization - Visit Number  5    Authorization - Number of Visits  10    PT Start Time  [id number]    PT Stop Time  1042    PT Time Calculation (min)  40 min    Equipment Utilized During Treatment  Other (comment);Gait belt   Rollator, Rt knee hinge brace   Activity Tolerance  Patient tolerated treatment well;Patient limited by fatigue;No increased pain    Behavior During Therapy  [facility] for tasks assessed/performed       Past Medical History:  Diagnosis Date  . Arthritis   . Atrial fibrillation ([place])   . Cataract   . Dysrhythmia    AFib  . GERD (gastroesophageal reflux disease)   . Hemochromatosis   . Iron excess     Past Surgical History:  Procedure Laterality Date  . CESAREAN SECTION    . KNEE ARTHROSCOPY WITH MEDIAL MENISECTOMY Right 01/28/2019   Procedure: RIGHT KNEE ARTHROSCOPY WITH MEDIAL MENISCECTOMY;  Surgeon: Carole Civil, MD;  Location: [facility];  Service: Orthopedics;  Laterality: Right;  . TONSILLECTOMY    . VENTRAL HERNIA REPAIR N/A 01/26/2018   Procedure: HERNIA REPAIR VENTRAL ADULT WITH MESH;  Surgeon: Virl Cagey, MD;  Location: [facility];  Service: General;  Laterality: N/A;    There were no vitals filed for this visit.  Subjective Assessment - 04/02/19 1003    Subjective  Pt reports she did a lot of walking without walker, reports new soreness in legs.  No reports of recent fall.  Rt ankle feels better  today.    Patient Stated Goals  Not be in pain and be able to walk, do more and not be unbalanced    Currently in Pain?  Yes    Pain Score  8     Pain Location  Leg    Pain Orientation  Right;Left    Pain Descriptors / Indicators  Aching;Sore    Pain Type  Acute pain    Pain Onset  In the past 7 days    Pain Frequency  Intermittent    Aggravating Factors   standing, bending, walking    Pain Relieving Factors  sitting    Effect of Pain on Daily Activities  limits                       [facility] Adult PT Treatment/Exercise - 04/02/19 0001      Knee/Hip Exercises: Stretches   Gastroc Stretch  Both;3 reps;30 seconds    Gastroc Stretch Limitations  slope      Knee/Hip Exercises: Standing   Heel Raises  Both;2 sets;10 reps    Forward Step Up  Both;10 reps;Hand Hold: 1;Step Height: 6"    Functional Squat  10 reps    Functional Squat Limitations  cueing for mechanics    Other Standing Knee Exercises  tandem stance 2x 30"    Other Standing Knee Exercises  sidestepping, 15 ft, 1RT; palloff press, red band 10 x ea      Knee/Hip Exercises: Seated   Other Seated Knee/Hip Exercises  toeraise    Sit to Sand  3 sets;5 reps   1st set with hands, 2-3rd sets without              PT Short Term Goals - 03/24/19 0943      PT SHORT TERM GOAL #1   Title  Patient will be independent with initial HEP to improve functional outcomes    Time  3    Period  Weeks    Status  On-going    Target Date  04/16/19        PT Long Term Goals - 03/24/19 0943      PT LONG TERM GOAL #1   Title  Patient will improve FOTO score to <50% to indicate improvement in functional outcomes    Time  6    Period  Weeks    Status  On-going      PT LONG TERM GOAL #2   Title  Patient will report at least 50% overall improvement in subjective complaint to indicate improvement in ability to perform ADLs.    Time  6    Period  Weeks    Status  On-going      PT LONG TERM GOAL #3   Title  Patient  will be able to maintain tandem stance >30 seconds on BLEs to improve stability and reduce risk for falls    Time  6    Period  Weeks    Status  On-going      PT LONG TERM GOAL #4   Title  Patient will be able to perform stand x 5 in < 15 seconds to demonstrate improvement in functional mobility and reduced risk for falls.    Time  6    Period  Weeks    Status  On-going            Plan - 04/02/19 1044    Clinical Impression Statement  Added squats and resistance wiht sidestep for gluteal strengthening to assist with STS.  Pt continues to demonstrate gluteal weakness with difficulty standings and decreased eccentric control lowering.  Educated on importance of posture to reduce anterior knee pain.  EOS pt was limited by fatigue, no reoprts of increased pain.    Examination-Activity Limitations  Squat;Stairs;Stand;Lift;Locomotion Level;Bend;Transfers    Examination-Participation Restrictions  Community Activity    Stability/Clinical Decision Making  Stable/Uncomplicated    Clinical Decision Making  Low    Rehab Potential  Good    PT Frequency  3x / week    PT Duration  6 weeks    PT Treatment/Interventions  ADLs/Self Care Home Management;Cryotherapy;Moist Heat;Ultrasound;Functional mobility training;Stair training;Neuromuscular re-education;Balance training;Gait training;DME Instruction;Therapeutic exercise;Therapeutic activities;Electrical Stimulation;Patient/family education;Orthotic Fit/Training;Compression bandaging;Taping;Spinal Manipulations;Passive range of motion;Manual techniques;Joint Manipulations;Vasopneumatic Device    PT Next Visit Plan  Continue to progress core and BLE strength as tolerated. Continue to progress gait training with [facility]    PT Home Exercise Plan  03/24/19: eated heelslides, abdominal bracing, sit to stands       Patient will benefit from skilled therapeutic intervention in order to improve the following deficits and impairments:  Abnormal gait, Decreased  balance, Decreased mobility, Difficulty walking, Hypomobility, Improper body mechanics, Decreased range of motion, Decreased activity tolerance, Decreased strength, Pain, Impaired flexibility  Visit Diagnosis: Acute right-sided  low back pain with right-sided sciatica  Other abnormalities of gait and mobility     Problem List Patient Active Problem List   Diagnosis Date Noted  . S/P right knee arthroscopy 01/28/2019 03/08/2019  . Derangement of posterior horn of medial meniscus of right knee   . Primary osteoarthritis of right knee   . Pain due to onychomycosis of toenails of both feet 12/07/2018  . Peripheral edema 12/01/2018  . Essential hypertension   . Chronic diastolic HF (heart failure) ([place])   . Cellulitis of leg, left 11/18/2018  . Ventral hernia without obstruction or gangrene 08/01/2017  . Mixed hyperlipidemia 02/27/2017  . Vitamin D deficiency 02/27/2017  . Hereditary hemochromatosis ([place]) 02/27/2017  . Atrial fibrillation, chronic 02/27/2017   Ihor Austin, LPTA; [place]  Aldona Lento 04/02/2019, 10:48 AM  [place], [place], [address] Phone: [phone]   Fax:  [phone]  Name: Lamiracle Owens MRN: [id number] Date of Birth: 13-Apr-1945

## 2019-04-05 ENCOUNTER — Other Ambulatory Visit: Payer: Self-pay

## 2019-04-05 ENCOUNTER — Encounter (HOSPITAL_COMMUNITY): Payer: Self-pay | Admitting: Physical Therapy

## 2019-04-05 ENCOUNTER — Ambulatory Visit (HOSPITAL_COMMUNITY): Payer: Medicare PPO | Admitting: Physical Therapy

## 2019-04-05 DIAGNOSIS — M5441 Lumbago with sciatica, right side: Secondary | ICD-10-CM

## 2019-04-05 DIAGNOSIS — R262 Difficulty in walking, not elsewhere classified: Secondary | ICD-10-CM | POA: Diagnosis not present

## 2019-04-05 DIAGNOSIS — R2689 Other abnormalities of gait and mobility: Secondary | ICD-10-CM | POA: Diagnosis not present

## 2019-04-05 DIAGNOSIS — M25662 Stiffness of left knee, not elsewhere classified: Secondary | ICD-10-CM | POA: Diagnosis not present

## 2019-04-05 DIAGNOSIS — M25562 Pain in left knee: Secondary | ICD-10-CM | POA: Diagnosis not present

## 2019-04-05 NOTE — Therapy (Signed)
[place], [place], [address] Phone: [phone]   Fax:  [phone]  Physical Therapy Treatment  Patient Details  Name: Amanda Mckay MRN: [id number] Date of Birth: [birth year] Referring Provider (PT): [name] MD   Encounter Date: 04/05/2019  PT End of Session - 04/05/19 0903    Visit Number  6    Number of Visits  18    Date for PT Re-Evaluation  05/07/19    Authorization Type  [employer]/ [employer] (No auth needed)    Authorization Time Period  03/22/19- 05/07/19    Authorization - Visit Number  6    Authorization - Number of Visits  10    PT Start Time  0900    PT Stop Time  0940    PT Time Calculation (min)  40 min    Equipment Utilized During Treatment  Other (comment);Gait belt   Rollator, Rt knee hinge brace   Activity Tolerance  Patient tolerated treatment well;Patient limited by fatigue    Behavior During Therapy  [facility] for tasks assessed/performed       Past Medical History:  Diagnosis Date  . Arthritis   . Atrial fibrillation ([place])   . Cataract   . Dysrhythmia    AFib  . GERD (gastroesophageal reflux disease)   . Hemochromatosis   . Iron excess     Past Surgical History:  Procedure Laterality Date  . CESAREAN SECTION    . KNEE ARTHROSCOPY WITH MEDIAL MENISECTOMY Right 01/28/2019   Procedure: RIGHT KNEE ARTHROSCOPY WITH MEDIAL MENISCECTOMY;  Surgeon: Carole Civil, MD;  Location: [facility];  Service: Orthopedics;  Laterality: Right;  . TONSILLECTOMY    . VENTRAL HERNIA REPAIR N/A 01/26/2018   Procedure: HERNIA REPAIR VENTRAL ADULT WITH MESH;  Surgeon: Virl Cagey, MD;  Location: [facility];  Service: General;  Laterality: N/A;    There were no vitals filed for this visit.  Subjective Assessment - 04/05/19 0903    Subjective  Patient says she is doing well today, had a good weekend. Patient says everything is "fine" but notes that she did not do a whole lot over the weekend,  says she is a little stiff this morning. Patient denies current pain just some discomfort/ stiffness in RT ankle.    Patient Stated Goals  Not be in pain and be able to walk, do more and not be unbalanced    Currently in Pain?  No/denies    Pain Onset  In the past 7 days                       [facility] Adult PT Treatment/Exercise - 04/05/19 0001      Knee/Hip Exercises: Stretches   Gastroc Stretch  Both;3 reps;30 seconds    Gastroc Stretch Limitations  slope      Knee/Hip Exercises: Standing   Heel Raises  Both;2 sets;10 reps    Hip Abduction  Both;15 reps    Hip Extension  Both;15 reps    Forward Step Up  Both;10 reps;Step Height: 6";Hand Hold: 2    Gait Training  150 feet, SPC in RT, cues for sequencing and cane distance    Other Standing Knee Exercises  tandem stance 2x 30", intermittent HHA    Other Standing Knee Exercises  sidestepping, 15 ft, 1RT      Knee/Hip Exercises: Seated   Long Arc Quad  Both;1 set;10 reps    Sit to [employer]  2 sets;5 reps;with UE support               PT Short Term Goals - 03/24/19 0943      PT SHORT TERM GOAL #1   Title  Patient will be independent with initial HEP to improve functional outcomes    Time  3    Period  Weeks    Status  On-going    Target Date  04/16/19        PT Long Term Goals - 03/24/19 0943      PT LONG TERM GOAL #1   Title  Patient will improve FOTO score to <50% to indicate improvement in functional outcomes    Time  6    Period  Weeks    Status  On-going      PT LONG TERM GOAL #2   Title  Patient will report at least 50% overall improvement in subjective complaint to indicate improvement in ability to perform ADLs.    Time  6    Period  Weeks    Status  On-going      PT LONG TERM GOAL #3   Title  Patient will be able to maintain tandem stance >30 seconds on BLEs to improve stability and reduce risk for falls    Time  6    Period  Weeks    Status  On-going      PT LONG TERM GOAL #4   Title   Patient will be able to perform stand x 5 in < 15 seconds to demonstrate improvement in functional mobility and reduced risk for falls.    Time  6    Period  Weeks    Status  On-going            Plan - 04/05/19 0944    Clinical Impression Statement  Patient demos slightly increased weakness today, possibly due to inactivity over the weekend. Patient showed increased difficulty with heel raises due to weakness and fatigue, but was able to complete all reps with intermittent rest break standing. Patient reported some discomfort in LT knee with standing exercise. Patient required UE assist for sit to stands and multiple attempts for momentum due to weakness, but was able to perform 2 x 5 reps once momentum was established. Patient was able to progress walking distance today, with SPC, verbal cues needed for sequencing keeping cane closer to body to avoid overreaching. Patient noted increased comfort with cane in RT hand.    Examination-Activity Limitations  Squat;Stairs;Stand;Lift;Locomotion Level;Bend;Transfers    Examination-Participation Restrictions  Community Activity    Stability/Clinical Decision Making  Stable/Uncomplicated    Rehab Potential  Good    PT Frequency  3x / week    PT Duration  6 weeks    PT Treatment/Interventions  ADLs/Self Care Home Management;Cryotherapy;Moist Heat;Ultrasound;Functional mobility training;Stair training;Neuromuscular re-education;Balance training;Gait training;DME Instruction;Therapeutic exercise;Therapeutic activities;Electrical Stimulation;Patient/family education;Orthotic Fit/Training;Compression bandaging;Taping;Spinal Manipulations;Passive range of motion;Manual techniques;Joint Manipulations;Vasopneumatic Device    PT Next Visit Plan  Continue to progress core and BLE strength as tolerated. Continue to progress gait training with [facility]    PT Home Exercise Plan  03/24/19: eated heelslides, abdominal bracing, sit to stands       Patient will benefit  from skilled therapeutic intervention in order to improve the following deficits and impairments:  Abnormal gait, Decreased balance, Decreased mobility, Difficulty walking, Hypomobility, Improper body mechanics, Decreased range of motion, Decreased activity tolerance, Decreased strength, Pain, Impaired flexibility  Visit Diagnosis: Acute right-sided low back pain  with right-sided sciatica  Other abnormalities of gait and mobility     Problem List Patient Active Problem List   Diagnosis Date Noted  . S/P right knee arthroscopy 01/28/2019 03/08/2019  . Derangement of posterior horn of medial meniscus of right knee   . Primary osteoarthritis of right knee   . Pain due to onychomycosis of toenails of both feet 12/07/2018  . Peripheral edema 12/01/2018  . Essential hypertension   . Chronic diastolic HF (heart failure) ([place])   . Cellulitis of leg, left 11/18/2018  . Ventral hernia without obstruction or gangrene 08/01/2017  . Mixed hyperlipidemia 02/27/2017  . Vitamin D deficiency 02/27/2017  . Hereditary hemochromatosis ([place]) 02/27/2017  . Atrial fibrillation, chronic 02/27/2017   9:45 AM, 04/05/19 Josue Hector PT DPT  Physical Therapist with [facility]  [phone] [place] [address] [place], [place], [address] Phone: [phone]   Fax:  [phone]  Name: Amanda Mckay MRN: [id number] Date of Birth: 08-17-1945

## 2019-04-07 ENCOUNTER — Ambulatory Visit (HOSPITAL_COMMUNITY): Payer: Medicare PPO | Admitting: Physical Therapy

## 2019-04-07 ENCOUNTER — Other Ambulatory Visit: Payer: Self-pay

## 2019-04-07 DIAGNOSIS — R262 Difficulty in walking, not elsewhere classified: Secondary | ICD-10-CM | POA: Diagnosis not present

## 2019-04-07 DIAGNOSIS — R2689 Other abnormalities of gait and mobility: Secondary | ICD-10-CM

## 2019-04-07 DIAGNOSIS — M25562 Pain in left knee: Secondary | ICD-10-CM | POA: Diagnosis not present

## 2019-04-07 DIAGNOSIS — M5441 Lumbago with sciatica, right side: Secondary | ICD-10-CM | POA: Diagnosis not present

## 2019-04-07 DIAGNOSIS — M25662 Stiffness of left knee, not elsewhere classified: Secondary | ICD-10-CM | POA: Diagnosis not present

## 2019-04-07 NOTE — Therapy (Signed)
[place], [place], [address] Phone: [phone]   Fax:  [phone]  Physical Therapy Treatment  Patient Details  Name: Amanda Mckay MRN: [id number] Date of Birth: [birth year] Referring Provider (PT): [name] MD   Encounter Date: 04/07/2019  PT End of Session - 04/07/19 1216    Visit Number  7    Number of Visits  18    Date for PT Re-Evaluation  05/07/19    Authorization Type  [employer]/ [employer] (No auth needed)    Authorization Time Period  03/22/19- 05/07/19    Authorization - Visit Number  7    Authorization - Number of Visits  10    PT Start Time  [id number]    PT Stop Time  1138    PT Time Calculation (min)  46 min    Equipment Utilized During Treatment  Other (comment);Gait belt   Rollator, Rt knee hinge brace   Activity Tolerance  Patient tolerated treatment well;Patient limited by fatigue    Behavior During Therapy  [facility] for tasks assessed/performed       Past Medical History:  Diagnosis Date  . Arthritis   . Atrial fibrillation ([place])   . Cataract   . Dysrhythmia    AFib  . GERD (gastroesophageal reflux disease)   . Hemochromatosis   . Iron excess     Past Surgical History:  Procedure Laterality Date  . CESAREAN SECTION    . KNEE ARTHROSCOPY WITH MEDIAL MENISECTOMY Right 01/28/2019   Procedure: RIGHT KNEE ARTHROSCOPY WITH MEDIAL MENISCECTOMY;  Surgeon: Carole Civil, MD;  Location: [facility];  Service: Orthopedics;  Laterality: Right;  . TONSILLECTOMY    . VENTRAL HERNIA REPAIR N/A 01/26/2018   Procedure: HERNIA REPAIR VENTRAL ADULT WITH MESH;  Surgeon: Virl Cagey, MD;  Location: [facility];  Service: General;  Laterality: N/A;    There were no vitals filed for this visit.  Subjective Assessment - 04/07/19 1113    Subjective  pt states she still sometimes has radiculopathy down Rt LE.  Currently iwthout pain.  Continues to wear her web brace on Rt knee and use AWW.    Currently in Pain?  No/denies                       [facility] Adult PT Treatment/Exercise - 04/07/19 0001      Knee/Hip Exercises: Standing   Heel Raises  Both;2 sets;10 reps    Hip Abduction  Both;20 reps    Hip Extension  Both;20 reps    Forward Step Up  Both;10 reps;Step Height: 6";Hand Hold: 1    SLS with Vectors  both bil UE assist 5X5"     Other Standing Knee Exercises  tandem stance 2x 30", intermittent HHA    Other Standing Knee Exercises  sidestepping, 15 ft, 1RT               PT Short Term Goals - 03/24/19 0943      PT SHORT TERM GOAL #1   Title  Patient will be independent with initial HEP to improve functional outcomes    Time  3    Period  Weeks    Status  On-going    Target Date  04/16/19        PT Long Term Goals - 03/24/19 0943      PT LONG TERM GOAL #1   Title  Patient will improve FOTO score to <50%  to indicate improvement in functional outcomes    Time  6    Period  Weeks    Status  On-going      PT LONG TERM GOAL #2   Title  Patient will report at least 50% overall improvement in subjective complaint to indicate improvement in ability to perform ADLs.    Time  6    Period  Weeks    Status  On-going      PT LONG TERM GOAL #3   Title  Patient will be able to maintain tandem stance >30 seconds on BLEs to improve stability and reduce risk for falls    Time  6    Period  Weeks    Status  On-going      PT LONG TERM GOAL #4   Title  Patient will be able to perform stand x 5 in < 15 seconds to demonstrate improvement in functional mobility and reduced risk for falls.    Time  6    Period  Weeks    Status  On-going            Plan - 04/07/19 1632    Clinical Impression Statement  continued to progress LE strength and stabiltiy.  Attempted most exerciess with less reliance on UE's.  Pt only able to complete 1/2 of second revolution with sidestepping due to feeling her "legs are giving out'.  No discomfort reported in LE's  with exercises today.    Examination-Activity Limitations  Squat;Stairs;Stand;Lift;Locomotion Level;Bend;Transfers    Examination-Participation Restrictions  Community Activity    Stability/Clinical Decision Making  Stable/Uncomplicated    Rehab Potential  Good    PT Frequency  3x / week    PT Duration  6 weeks    PT Treatment/Interventions  ADLs/Self Care Home Management;Cryotherapy;Moist Heat;Ultrasound;Functional mobility training;Stair training;Neuromuscular re-education;Balance training;Gait training;DME Instruction;Therapeutic exercise;Therapeutic activities;Electrical Stimulation;Patient/family education;Orthotic Fit/Training;Compression bandaging;Taping;Spinal Manipulations;Passive range of motion;Manual techniques;Joint Manipulations;Vasopneumatic Device    PT Next Visit Plan  Continue to progress core and BLE strength as tolerated. Continue to progress gait training with [facility]    PT Home Exercise Plan  03/24/19: eated heelslides, abdominal bracing, sit to stands       Patient will benefit from skilled therapeutic intervention in order to improve the following deficits and impairments:  Abnormal gait, Decreased balance, Decreased mobility, Difficulty walking, Hypomobility, Improper body mechanics, Decreased range of motion, Decreased activity tolerance, Decreased strength, Pain, Impaired flexibility  Visit Diagnosis: Acute pain of left knee  Acute right-sided low back pain with right-sided sciatica  Other abnormalities of gait and mobility     Problem List Patient Active Problem List   Diagnosis Date Noted  . S/P right knee arthroscopy 01/28/2019 03/08/2019  . Derangement of posterior horn of medial meniscus of right knee   . Primary osteoarthritis of right knee   . Pain due to onychomycosis of toenails of both feet 12/07/2018  . Peripheral edema 12/01/2018  . Essential hypertension   . Chronic diastolic HF (heart failure) ([place])   . Cellulitis of leg, left 11/18/2018  .  Ventral hernia without obstruction or gangrene 08/01/2017  . Mixed hyperlipidemia 02/27/2017  . Vitamin D deficiency 02/27/2017  . Hereditary hemochromatosis ([place]) 02/27/2017  . Atrial fibrillation, chronic 02/27/2017   Teena Irani, PTA/CLT [phone]  Teena Irani 04/07/2019, 4:35 PM  [place] [address] [place], [place], [address] Phone: [phone]   Fax:  [phone]  Name: Gurnaaz Schwake MRN: [id number] Date of  Birth: [birth year]

## 2019-04-09 ENCOUNTER — Other Ambulatory Visit: Payer: Self-pay

## 2019-04-09 ENCOUNTER — Encounter (HOSPITAL_COMMUNITY): Payer: Self-pay

## 2019-04-09 ENCOUNTER — Ambulatory Visit (HOSPITAL_COMMUNITY): Payer: Medicare PPO

## 2019-04-09 DIAGNOSIS — M25662 Stiffness of left knee, not elsewhere classified: Secondary | ICD-10-CM | POA: Diagnosis not present

## 2019-04-09 DIAGNOSIS — R2689 Other abnormalities of gait and mobility: Secondary | ICD-10-CM

## 2019-04-09 DIAGNOSIS — M5441 Lumbago with sciatica, right side: Secondary | ICD-10-CM | POA: Diagnosis not present

## 2019-04-09 DIAGNOSIS — M25562 Pain in left knee: Secondary | ICD-10-CM | POA: Diagnosis not present

## 2019-04-09 DIAGNOSIS — R262 Difficulty in walking, not elsewhere classified: Secondary | ICD-10-CM | POA: Diagnosis not present

## 2019-04-09 NOTE — Therapy (Signed)
[place], [place], [address] Phone: [phone]   Fax:  [phone]  Physical Therapy Treatment  Patient Details  Name: Amanda Mckay MRN: [id number] Date of Birth: Jan 05, 1946 Referring Provider (PT): [name] MD   Encounter Date: 04/09/2019  PT End of Session - 04/09/19 0901    Visit Number  8    Number of Visits  18    Date for PT Re-Evaluation  05/07/19    Authorization Type  [employer]/ [employer] (No auth needed)    Authorization Time Period  03/22/19- 05/07/19    Authorization - Visit Number  8    Authorization - Number of Visits  10    PT Start Time  [id number]    PT Stop Time  0943    PT Time Calculation (min)  45 min    Equipment Utilized During Treatment  Other (comment);Gait belt   Rollator, SPC, Rt knee hinge brace   Activity Tolerance  Patient tolerated treatment well;Patient limited by fatigue;Patient limited by pain;No increased pain   Rt ankle pain   Behavior During Therapy  [facility] for tasks assessed/performed       Past Medical History:  Diagnosis Date  . Arthritis   . Atrial fibrillation ([place])   . Cataract   . Dysrhythmia    AFib  . GERD (gastroesophageal reflux disease)   . Hemochromatosis   . Iron excess     Past Surgical History:  Procedure Laterality Date  . CESAREAN SECTION    . KNEE ARTHROSCOPY WITH MEDIAL MENISECTOMY Right 01/28/2019   Procedure: RIGHT KNEE ARTHROSCOPY WITH MEDIAL MENISCECTOMY;  Surgeon: Carole Civil, MD;  Location: [facility];  Service: Orthopedics;  Laterality: Right;  . TONSILLECTOMY    . VENTRAL HERNIA REPAIR N/A 01/26/2018   Procedure: HERNIA REPAIR VENTRAL ADULT WITH MESH;  Surgeon: Virl Cagey, MD;  Location: [facility];  Service: General;  Laterality: N/A;    There were no vitals filed for this visit.  Subjective Assessment - 04/09/19 0859    Subjective  Pt reports her knee is feeling good, Rt ankle is bothering her more today.  Pain scale  8-9/10.    Patient Stated Goals  Not be in pain and be able to walk, do more and not be unbalanced    Currently in Pain?  Yes    Pain Score  9     Pain Location  Ankle    Pain Orientation  Right    Pain Descriptors / Indicators  Pressure;Aching;Sore   "it just hurts", increased pain with weight bearing   Pain Type  Acute pain    Pain Radiating Towards  Rt ankle    Pain Onset  In the past 7 days    Pain Frequency  Intermittent    Aggravating Factors   standing, bending, walking    Pain Relieving Factors  sitting    Effect of Pain on Daily Activities  limits         [facility] PT Assessment - 04/09/19 0001      Assessment   Medical Diagnosis  Low back pain with RT radiculopathy    Referring Provider (PT)  [name] MD    Onset Date/Surgical Date  01/31/19    Hand Dominance  Right    Next MD Visit  05/05/19    Prior Therapy  No      Precautions   Precautions  Fall    Required Braces or Orthoses  Other  Brace/Splint   Rt knee hinge brace                  [facility] Adult PT Treatment/Exercise - 04/09/19 0001      Knee/Hip Exercises: Stretches   Gastroc Stretch  Both;3 reps;30 seconds    Gastroc Stretch Limitations  slope      Knee/Hip Exercises: Machines for Strengthening   Cybex Leg Press  BLE 30# 10x 2 sets; PF 10x slow      Knee/Hip Exercises: Standing   Heel Raises  Both;2 sets;10 reps    Hip Abduction  Both;20 reps    Forward Step Up  Both;10 reps;Step Height: 6";Hand Hold: 1    Functional Squat  10 reps    Functional Squat Limitations  cueing for mechanics    Gait Training  150 feet, SPC in RT, cues for sequencing and cane distance    Other Standing Knee Exercises  tandem stance on foam2x 30", intermittent HHA    Other Standing Knee Exercises  sidestepping, 15 ft, 1RT      Knee/Hip Exercises: Seated   Sit to Sand  2 sets;5 reps;without UE support   eccentric control              PT Short Term Goals - 03/24/19 0943      PT SHORT TERM GOAL  #1   Title  Patient will be independent with initial HEP to improve functional outcomes    Time  3    Period  Weeks    Status  On-going    Target Date  04/16/19        PT Long Term Goals - 03/24/19 0943      PT LONG TERM GOAL #1   Title  Patient will improve FOTO score to <50% to indicate improvement in functional outcomes    Time  6    Period  Weeks    Status  On-going      PT LONG TERM GOAL #2   Title  Patient will report at least 50% overall improvement in subjective complaint to indicate improvement in ability to perform ADLs.    Time  6    Period  Weeks    Status  On-going      PT LONG TERM GOAL #3   Title  Patient will be able to maintain tandem stance >30 seconds on BLEs to improve stability and reduce risk for falls    Time  6    Period  Weeks    Status  On-going      PT LONG TERM GOAL #4   Title  Patient will be able to perform stand x 5 in < 15 seconds to demonstrate improvement in functional mobility and reduced risk for falls.    Time  6    Period  Weeks    Status  On-going            Plan - 04/09/19 [id number]    Clinical Impression Statement  Pt limited by Rt ankle pain with weight bearing activities.  Continues to demonstrate significant LE weakness noted by difficulty/inablitility to complete heel raises with good posture and assistance required with sit to stands and difficulty wiht eccentric control.  Added leg press wiht additional plantar flexion for strengthening in non-weight bearing for pain control.  Visible musculature fatigue noted with new activity with occassional rest break for recovery.  EOS pt reports vast improvements with reduced ankle pain, encouraged pt to continue with heel raises as HEP.  Examination-Activity Limitations  Squat;Stairs;Stand;Lift;Locomotion Level;Bend;Transfers    Examination-Participation Restrictions  Community Activity    Stability/Clinical Decision Making  Stable/Uncomplicated    Clinical Decision Making  Low     Rehab Potential  Good    PT Frequency  3x / week    PT Duration  6 weeks    PT Treatment/Interventions  ADLs/Self Care Home Management;Cryotherapy;Moist Heat;Ultrasound;Functional mobility training;Stair training;Neuromuscular re-education;Balance training;Gait training;DME Instruction;Therapeutic exercise;Therapeutic activities;Electrical Stimulation;Patient/family education;Orthotic Fit/Training;Compression bandaging;Taping;Spinal Manipulations;Passive range of motion;Manual techniques;Joint Manipulations;Vasopneumatic Device    PT Next Visit Plan  Continue to progress core and BLE strength as tolerated. Continue to progress gait training with [facility]    PT Home Exercise Plan  03/24/19: seated heelslides, abdominal bracing, sit to stands       Patient will benefit from skilled therapeutic intervention in order to improve the following deficits and impairments:  Abnormal gait, Decreased balance, Decreased mobility, Difficulty walking, Hypomobility, Improper body mechanics, Decreased range of motion, Decreased activity tolerance, Decreased strength, Pain, Impaired flexibility  Visit Diagnosis: Other abnormalities of gait and mobility  Acute right-sided low back pain with right-sided sciatica     Problem List Patient Active Problem List   Diagnosis Date Noted  . S/P right knee arthroscopy 01/28/2019 03/08/2019  . Derangement of posterior horn of medial meniscus of right knee   . Primary osteoarthritis of right knee   . Pain due to onychomycosis of toenails of both feet 12/07/2018  . Peripheral edema 12/01/2018  . Essential hypertension   . Chronic diastolic HF (heart failure) ([place])   . Cellulitis of leg, left 11/18/2018  . Ventral hernia without obstruction or gangrene 08/01/2017  . Mixed hyperlipidemia 02/27/2017  . Vitamin D deficiency 02/27/2017  . Hereditary hemochromatosis ([place]) 02/27/2017  . Atrial fibrillation, chronic 02/27/2017   Ihor Austin, LPTA;  CBIS [phone] Aldona Lento 04/09/2019, 9:59 AM  [place], [place], [address] Phone: [phone]   Fax:  [phone]  Name: Amanda Mckay MRN: [id number] Date of Birth: 1945/12/16

## 2019-04-12 ENCOUNTER — Ambulatory Visit (HOSPITAL_COMMUNITY): Payer: Medicare PPO | Admitting: Physical Therapy

## 2019-04-12 ENCOUNTER — Encounter (HOSPITAL_COMMUNITY): Payer: Self-pay | Admitting: Physical Therapy

## 2019-04-12 ENCOUNTER — Other Ambulatory Visit: Payer: Self-pay

## 2019-04-12 DIAGNOSIS — R262 Difficulty in walking, not elsewhere classified: Secondary | ICD-10-CM | POA: Diagnosis not present

## 2019-04-12 DIAGNOSIS — M5441 Lumbago with sciatica, right side: Secondary | ICD-10-CM | POA: Diagnosis not present

## 2019-04-12 DIAGNOSIS — M25662 Stiffness of left knee, not elsewhere classified: Secondary | ICD-10-CM | POA: Diagnosis not present

## 2019-04-12 DIAGNOSIS — R2689 Other abnormalities of gait and mobility: Secondary | ICD-10-CM | POA: Diagnosis not present

## 2019-04-12 DIAGNOSIS — M25562 Pain in left knee: Secondary | ICD-10-CM | POA: Diagnosis not present

## 2019-04-12 NOTE — Therapy (Signed)
[place], [place], [address] Phone: [phone]   Fax:  [phone]  Physical Therapy Treatment  Patient Details  Name: Amanda Mckay MRN: [id number] Date of Birth: 1945/11/21 Referring Provider (PT): [name] MD   Encounter Date: 04/12/2019  PT End of Session - 04/12/19 1039    Visit Number  9    Number of Visits  18    Date for PT Re-Evaluation  05/07/19    Authorization Type  [employer]/ [employer] (No auth needed)    Authorization Time Period  03/22/19- 05/07/19    Authorization - Visit Number  9    Authorization - Number of Visits  10    PT Start Time  1031    PT Stop Time  1113    PT Time Calculation (min)  42 min    Equipment Utilized During Treatment  Other (comment);Gait belt   Rollator, SPC, Rt knee hinge brace   Activity Tolerance  Patient tolerated treatment well   Rt ankle pain   Behavior During Therapy  [facility] for tasks assessed/performed       Past Medical History:  Diagnosis Date  . Arthritis   . Atrial fibrillation ([place])   . Cataract   . Dysrhythmia    AFib  . GERD (gastroesophageal reflux disease)   . Hemochromatosis   . Iron excess     Past Surgical History:  Procedure Laterality Date  . CESAREAN SECTION    . KNEE ARTHROSCOPY WITH MEDIAL MENISECTOMY Right 01/28/2019   Procedure: RIGHT KNEE ARTHROSCOPY WITH MEDIAL MENISCECTOMY;  Surgeon: Carole Civil, MD;  Location: [facility];  Service: Orthopedics;  Laterality: Right;  . TONSILLECTOMY    . VENTRAL HERNIA REPAIR N/A 01/26/2018   Procedure: HERNIA REPAIR VENTRAL ADULT WITH MESH;  Surgeon: Virl Cagey, MD;  Location: [facility];  Service: General;  Laterality: N/A;    There were no vitals filed for this visit.  Subjective Assessment - 04/12/19 1038    Subjective  Patient says her RT ankle is bothering her today, 9.10. Patient says she is generally more stiff today due to rain. Patient says her ankle has continues to  ache and has given her some trouble going up the stairs over the weekend.    Patient Stated Goals  Not be in pain and be able to walk, do more and not be unbalanced    Currently in Pain?  Yes    Pain Score  9     Pain Location  Ankle    Pain Orientation  Right    Pain Descriptors / Indicators  Aching    Pain Type  Acute pain                       [facility] Adult PT Treatment/Exercise - 04/12/19 0001      Exercises   Exercises  Lumbar      Lumbar Exercises: Supine   Ab Set  15 reps    Other Supine Lumbar Exercises  ab marching, alternating, x20     Other Supine Lumbar Exercises  iso hip abd with belt; 15 x5"       Knee/Hip Exercises: Stretches   Gastroc Stretch  Both;3 reps;30 seconds    Gastroc Stretch Limitations  attempted but patient unable due to increased pain       Knee/Hip Exercises: Standing   Gait Training  200 feet, SPC in LT, cues for sequencing and cane distance  Knee/Hip Exercises: Supine   Quad Sets  Both;15 reps    Quad Sets Limitations  with towel roll    Bridges  Both;15 reps    Bridges Limitations  5 sec hold    Straight Leg Raises  Both;15 reps               PT Short Term Goals - 03/24/19 0943      PT SHORT TERM GOAL #1   Title  Patient will be independent with initial HEP to improve functional outcomes    Time  3    Period  Weeks    Status  On-going    Target Date  04/16/19        PT Long Term Goals - 03/24/19 0943      PT LONG TERM GOAL #1   Title  Patient will improve FOTO score to <50% to indicate improvement in functional outcomes    Time  6    Period  Weeks    Status  On-going      PT LONG TERM GOAL #2   Title  Patient will report at least 50% overall improvement in subjective complaint to indicate improvement in ability to perform ADLs.    Time  6    Period  Weeks    Status  On-going      PT LONG TERM GOAL #3   Title  Patient will be able to maintain tandem stance >30 seconds on BLEs to improve stability  and reduce risk for falls    Time  6    Period  Weeks    Status  On-going      PT LONG TERM GOAL #4   Title  Patient will be able to perform stand x 5 in < 15 seconds to demonstrate improvement in functional mobility and reduced risk for falls.    Time  6    Period  Weeks    Status  On-going            Plan - 04/12/19 1115    Clinical Impression Statement  Patient reported increased RT ankle pain with weightbearing activity today so activity modified to accommodate. Patient educated on LE and core strengthening in gravity reduce/ supine positioning. Patient educated on purpose and function of all added exercise. Patient tolerated this adjustment well with no increased complaint of pain. Patient was able to progress walking distance, with cane in LT hand today. Patient cued on sequencing of 2 step gait, and for increased step length on LT for step through gait pattern. Patient noted increased muscle fatigue post treatment.    Examination-Activity Limitations  Squat;Stairs;Stand;Lift;Locomotion Level;Bend;Transfers    Examination-Participation Restrictions  Community Activity    Stability/Clinical Decision Making  Stable/Uncomplicated    Rehab Potential  Good    PT Frequency  3x / week    PT Duration  6 weeks    PT Treatment/Interventions  ADLs/Self Care Home Management;Cryotherapy;Moist Heat;Ultrasound;Functional mobility training;Stair training;Neuromuscular re-education;Balance training;Gait training;DME Instruction;Therapeutic exercise;Therapeutic activities;Electrical Stimulation;Patient/family education;Orthotic Fit/Training;Compression bandaging;Taping;Spinal Manipulations;Passive range of motion;Manual techniques;Joint Manipulations;Vasopneumatic Device    PT Next Visit Plan  Continue to progress core and BLE strength as tolerated. Continue to progress gait training with SPC. Resume standing ther ex as able.    PT Home Exercise Plan  03/24/19: seated heelslides, abdominal bracing, sit  to stands       Patient will benefit from skilled therapeutic intervention in order to improve the following deficits and impairments:  Abnormal gait, Decreased balance, Decreased mobility,  Difficulty walking, Hypomobility, Improper body mechanics, Decreased range of motion, Decreased activity tolerance, Decreased strength, Pain, Impaired flexibility  Visit Diagnosis: Other abnormalities of gait and mobility  Acute right-sided low back pain with right-sided sciatica  Difficulty in walking, not elsewhere classified     Problem List Patient Active Problem List   Diagnosis Date Noted  . S/P right knee arthroscopy 01/28/2019 03/08/2019  . Derangement of posterior horn of medial meniscus of right knee   . Primary osteoarthritis of right knee   . Pain due to onychomycosis of toenails of both feet 12/07/2018  . Peripheral edema 12/01/2018  . Essential hypertension   . Chronic diastolic HF (heart failure) ([place])   . Cellulitis of leg, left 11/18/2018  . Ventral hernia without obstruction or gangrene 08/01/2017  . Mixed hyperlipidemia 02/27/2017  . Vitamin D deficiency 02/27/2017  . Hereditary hemochromatosis ([place]) 02/27/2017  . Atrial fibrillation, chronic 02/27/2017   11:17 AM, 04/12/19 Josue Hector PT DPT  Physical Therapist with [facility]  [phone] [place] [address] [place], [place], [address] Phone: [phone]   Fax:  [phone]  Name: Karelin Dockum MRN: [id number] Date of Birth: 1945-10-11

## 2019-04-14 ENCOUNTER — Ambulatory Visit (HOSPITAL_COMMUNITY): Payer: Medicare PPO

## 2019-04-14 ENCOUNTER — Telehealth (HOSPITAL_COMMUNITY): Payer: Self-pay

## 2019-04-14 NOTE — Telephone Encounter (Signed)
pt called to cancel today's visit due to she had sprained her ankle

## 2019-04-15 ENCOUNTER — Telehealth: Payer: Self-pay | Admitting: Cardiovascular Disease

## 2019-04-15 NOTE — Telephone Encounter (Signed)

## 2019-04-16 ENCOUNTER — Encounter (HOSPITAL_COMMUNITY): Payer: Self-pay | Admitting: Physical Therapy

## 2019-04-16 ENCOUNTER — Ambulatory Visit (HOSPITAL_COMMUNITY): Payer: Medicare PPO | Admitting: Physical Therapy

## 2019-04-16 ENCOUNTER — Other Ambulatory Visit: Payer: Self-pay

## 2019-04-16 DIAGNOSIS — R2689 Other abnormalities of gait and mobility: Secondary | ICD-10-CM

## 2019-04-16 DIAGNOSIS — R262 Difficulty in walking, not elsewhere classified: Secondary | ICD-10-CM

## 2019-04-16 DIAGNOSIS — M25562 Pain in left knee: Secondary | ICD-10-CM | POA: Diagnosis not present

## 2019-04-16 DIAGNOSIS — M5441 Lumbago with sciatica, right side: Secondary | ICD-10-CM | POA: Diagnosis not present

## 2019-04-16 DIAGNOSIS — M25662 Stiffness of left knee, not elsewhere classified: Secondary | ICD-10-CM | POA: Diagnosis not present

## 2019-04-16 NOTE — Therapy (Signed)
Amanda Mckay, [place], [address] Phone: [phone]   Fax:  [phone]  Physical Therapy Treatment/Progress Note  Patient Details  Name: Amanda Mckay MRN: [id number] Date of Birth: 10/19/1945 Referring Provider (PT): [name] MD   Encounter Date: 04/16/2019  Progress Note   Reporting Period 03/22/19 to 04/16/19   See note below for Objective Data and Assessment of Progress/Goals   PT End of Session - 04/16/19 0923    Visit Number  10    Number of Visits  18    Date for PT Re-Evaluation  05/07/19    Authorization Type  [employer]/ [employer] (No auth needed)    Authorization Time Period  03/22/19- 05/07/19    Authorization - Visit Number  10    Authorization - Number of Visits  10    PT Start Time  0915    PT Stop Time  0958    PT Time Calculation (min)  43 min    Equipment Utilized During Treatment  Other (comment);Gait belt   Rollator   Activity Tolerance  Patient tolerated treatment well    Behavior During Therapy  [facility] for tasks assessed/performed       Past Medical History:  Diagnosis Date  . Arthritis   . Atrial fibrillation ([place])   . Cataract   . Dysrhythmia    AFib  . GERD (gastroesophageal reflux disease)   . Hemochromatosis   . Iron excess     Past Surgical History:  Procedure Laterality Date  . CESAREAN SECTION    . KNEE ARTHROSCOPY WITH MEDIAL MENISECTOMY Right 01/28/2019   Procedure: RIGHT KNEE ARTHROSCOPY WITH MEDIAL MENISCECTOMY;  Surgeon: Carole Civil, MD;  Location: [facility];  Service: Orthopedics;  Laterality: Right;  . TONSILLECTOMY    . VENTRAL HERNIA REPAIR N/A 01/26/2018   Procedure: HERNIA REPAIR VENTRAL ADULT WITH MESH;  Surgeon: Virl Cagey, MD;  Location: [facility];  Service: General;  Laterality: N/A;    There were no vitals filed for this visit.  Subjective Assessment - 04/16/19 0916    Subjective  Patient reports her ankle is still bothering her  today. She hurt it the other day when stepping up into her husband's truck and then it was swollen. She has not had any trouble with exercise and she is able to do them every day. Patient reports 40-50% improvement with physical therapy. She is having trouble with transfers, ambulation, and chores. She would like to not have to use her walking. Her back has not been painful and her main complaint is her legs are weak and her balance is not very good.    Patient Stated Goals  Not be in pain and be able to walk, do more and not be unbalanced    Currently in Pain?  No/denies         [facility] PT Assessment - 04/16/19 0001      Assessment   Medical Diagnosis  Low back pain with RT radiculopathy    Referring Provider (PT)  [name] MD    Onset Date/Surgical Date  01/31/19    Hand Dominance  Right    Next MD Visit  05/05/19    Prior Therapy  No      Precautions   Precautions  Fall      Restrictions   Weight Bearing Restrictions  No      Balance Screen   Has the patient fallen in the past 6 months  No    Has the patient had a decrease in activity level because of a fear of falling?   Yes    Is the patient reluctant to leave their home because of a fear of falling?   No      Home Social worker  Private residence    Living Arrangements  Spouse/significant other      Strength   Right Hip Flexion  4+/5    Left Hip Flexion  4+/5    Right Knee Flexion  4/5    Right Knee Extension  4+/5    Left Knee Flexion  4+/5    Left Knee Extension  4+/5    Right Ankle Dorsiflexion  --   pain - NT   Left Ankle Dorsiflexion  4/5      Static Standing Balance   Static Standing Balance -  Activities   Tandam Stance - Right Leg;Tandam Stance - Left Leg    Static Standing - Comment/# of Minutes  5 seconds; 19 seconds                   [facility] Adult PT Treatment/Exercise - 04/16/19 0001      Transfers   Five time sit to stand comments   37.56 seconds      Knee/Hip  Exercises: Standing   Other Standing Knee Exercises  tandem stance 2x 30", intermittent HHA    Other Standing Knee Exercises  squat at sink 2x10 with verbal cueing  for decreasing weight shift; side stepping 4x15 feet              PT Education - 04/16/19 0959    Education Details  Patient educated on progress note findings, HEP, progress toward goals.    Person(s) Educated  Patient    Methods  Explanation;Handout    Comprehension  Verbalized understanding       PT Short Term Goals - 04/16/19 0924      PT SHORT TERM GOAL #1   Title  Patient will be independent with initial HEP to improve functional outcomes    Time  3    Period  Weeks    Status  Achieved    Target Date  04/16/19        PT Long Term Goals - 04/16/19 0925      PT LONG TERM GOAL #1   Title  Patient will improve FOTO score to <50% to indicate improvement in functional outcomes    Time  6    Period  Weeks    Status  On-going      PT LONG TERM GOAL #2   Title  Patient will report at least 50% overall improvement in subjective complaint to indicate improvement in ability to perform ADLs.    Time  6    Period  Weeks    Status  On-going      PT LONG TERM GOAL #3   Title  Patient will be able to maintain tandem stance >30 seconds on BLEs to improve stability and reduce risk for falls    Baseline  04/16/19 L 19 seconds R 5 seconds    Time  6    Period  Weeks    Status  On-going      PT LONG TERM GOAL #4   Title  Patient will be able to perform stand x 5 in < 15 seconds to demonstrate improvement in functional mobility and reduced risk for falls.    Baseline  04/16/19 37.56 seconds    Time  6    Period  Weeks    Status  On-going            Plan - 04/16/19 1000    Clinical Impression Statement  Patient has met 1/1 short term goals and 0/4 long term goals at this time. She states compliance with HEP. She has improved in strength and symptoms overall but recent ankle injury has impaired her progress  over the last several visits. She continues to demonstrate impaired gait, balance, functional strength and overall mobility which affects participation in ADLs. Patient able to tolerate standing exercises today with minimal increase in ankle symptoms. She demonstrates most difficulty with balance and functional strength. Patient requires frequent rest breaks throughout session. Patient will continue to benefit from skilled physical therapy in order to improve function and reduce impairment.    Examination-Activity Limitations  Squat;Stairs;Stand;Lift;Locomotion Level;Bend;Transfers    Examination-Participation Restrictions  Community Activity    Stability/Clinical Decision Making  Stable/Uncomplicated    Rehab Potential  Good    PT Frequency  3x / week    PT Duration  6 weeks    PT Treatment/Interventions  ADLs/Self Care Home Management;Cryotherapy;Moist Heat;Ultrasound;Functional mobility training;Stair training;Neuromuscular re-education;Balance training;Gait training;DME Instruction;Therapeutic exercise;Therapeutic activities;Electrical Stimulation;Patient/family education;Orthotic Fit/Training;Compression bandaging;Taping;Spinal Manipulations;Passive range of motion;Manual techniques;Joint Manipulations;Vasopneumatic Device    PT Next Visit Plan  Continue to progress core and BLE strength as tolerated. Continue to progress gait training with SPC. Resume standing ther ex as able.    PT Home Exercise Plan  03/24/19: seated heelslides, abdominal bracing, sit to stands 04/16/19 squat with counter support 2x10       Patient will benefit from skilled therapeutic intervention in order to improve the following deficits and impairments:  Abnormal gait, Decreased balance, Decreased mobility, Difficulty walking, Hypomobility, Improper body mechanics, Decreased range of motion, Decreased activity tolerance, Decreased strength, Pain, Impaired flexibility  Visit Diagnosis: Other abnormalities of gait and  mobility  Acute right-sided low back pain with right-sided sciatica  Difficulty in walking, not elsewhere classified     Problem List Patient Active Problem List   Diagnosis Date Noted  . S/P right knee arthroscopy 01/28/2019 03/08/2019  . Derangement of posterior horn of medial meniscus of right knee   . Primary osteoarthritis of right knee   . Pain due to onychomycosis of toenails of both feet 12/07/2018  . Peripheral edema 12/01/2018  . Essential hypertension   . Chronic diastolic HF (heart failure) ([place])   . Cellulitis of leg, left 11/18/2018  . Ventral hernia without obstruction or gangrene 08/01/2017  . Mixed hyperlipidemia 02/27/2017  . Vitamin D deficiency 02/27/2017  . Hereditary hemochromatosis ([place]) 02/27/2017  . Atrial fibrillation, chronic 02/27/2017    12:03 PM, 04/16/19 Mearl Latin PT, DPT Physical Therapist at [facility] [place], [place], [address] Phone: [phone]   Fax:  [phone]  Name: Amanda Mckay MRN: [id number] Date of Birth: Apr 15, 1945

## 2019-04-16 NOTE — Patient Instructions (Signed)
Access Code: [email or web address]  URL: [email or web address]  Date: 04/16/2019  Prepared by: Margie Billet   Exercises Squat with Counter Support - 10 reps - 3 sets - 1x daily - 7x weekly

## 2019-04-19 ENCOUNTER — Ambulatory Visit (HOSPITAL_COMMUNITY): Payer: Medicare PPO | Attending: Orthopedic Surgery | Admitting: Physical Therapy

## 2019-04-19 ENCOUNTER — Other Ambulatory Visit: Payer: Self-pay

## 2019-04-19 ENCOUNTER — Encounter (HOSPITAL_COMMUNITY): Payer: Self-pay | Admitting: Physical Therapy

## 2019-04-19 DIAGNOSIS — M5441 Lumbago with sciatica, right side: Secondary | ICD-10-CM | POA: Insufficient documentation

## 2019-04-19 DIAGNOSIS — M25562 Pain in left knee: Secondary | ICD-10-CM | POA: Insufficient documentation

## 2019-04-19 DIAGNOSIS — R2689 Other abnormalities of gait and mobility: Secondary | ICD-10-CM | POA: Diagnosis not present

## 2019-04-19 DIAGNOSIS — R262 Difficulty in walking, not elsewhere classified: Secondary | ICD-10-CM | POA: Diagnosis not present

## 2019-04-19 DIAGNOSIS — M25662 Stiffness of left knee, not elsewhere classified: Secondary | ICD-10-CM | POA: Insufficient documentation

## 2019-04-19 NOTE — Therapy (Signed)
[place], [place], [address] Phone: [phone]   Fax:  [phone]  Physical Therapy Treatment  Patient Details  Name: Amanda Mckay MRN: [id number] Date of Birth: 04-30-1945 Referring Provider (PT): [name] MD   Encounter Date: 04/19/2019  PT End of Session - 04/19/19 0948    Visit Number  11    Number of Visits  18    Date for PT Re-Evaluation  05/07/19    Authorization Type  [employer]/ [employer] (No auth needed)    Authorization Time Period  03/22/19- 05/07/19    Authorization - Visit Number  1    Authorization - Number of Visits  10    PT Start Time  0945    PT Stop Time  1026    PT Time Calculation (min)  41 min    Equipment Utilized During Treatment  Other (comment);Gait belt   Rollator   Activity Tolerance  Patient tolerated treatment well;Patient limited by fatigue    Behavior During Therapy  [facility] for tasks assessed/performed       Past Medical History:  Diagnosis Date  . Arthritis   . Atrial fibrillation ([place])   . Cataract   . Dysrhythmia    AFib  . GERD (gastroesophageal reflux disease)   . Hemochromatosis   . Iron excess     Past Surgical History:  Procedure Laterality Date  . CESAREAN SECTION    . KNEE ARTHROSCOPY WITH MEDIAL MENISECTOMY Right 01/28/2019   Procedure: RIGHT KNEE ARTHROSCOPY WITH MEDIAL MENISCECTOMY;  Surgeon: Carole Civil, MD;  Location: [facility];  Service: Orthopedics;  Laterality: Right;  . TONSILLECTOMY    . VENTRAL HERNIA REPAIR N/A 01/26/2018   Procedure: HERNIA REPAIR VENTRAL ADULT WITH MESH;  Surgeon: Virl Cagey, MD;  Location: [facility];  Service: General;  Laterality: N/A;    There were no vitals filed for this visit.  Subjective Assessment - 04/19/19 0947    Subjective  Patient says her ankle was sore last week but feels better today. Patient says that's where most of her pain has come from lately. Patient says she did some standing  exercise last time and felt fine.    Patient Stated Goals  Not be in pain and be able to walk, do more and not be unbalanced    Currently in Pain?  Yes    Pain Score  4     Pain Location  Ankle    Pain Orientation  Right                       [facility] Adult PT Treatment/Exercise - 04/19/19 0001      Knee/Hip Exercises: Stretches   Knee: Self-Stretch to increase Flexion  Both;3 reps;30 seconds    Knee: Self-Stretch Limitations  on 12 inch step    Gastroc Stretch  Both;3 reps;30 seconds    Gastroc Stretch Limitations  slope       Knee/Hip Exercises: Standing   Hip Abduction  Both;15 reps    Hip Extension  Both;15 reps    Forward Step Up  Both;10 reps;Step Height: 4";Hand Hold: 2    Functional Squat  15 reps    Functional Squat Limitations  at // bars for [facility]    Gait Training  200 feet, SPC in LT, cues for sequencing and step length on LT    Other Standing Knee Exercises  tandem stance 2x 30", intermittent HHA; NBOS on foam  with head turns x10    Other Standing Knee Exercises  1 RT sidestepping, 15'; attempted tandem gait (too difficult)               PT Short Term Goals - 04/16/19 0924      PT SHORT TERM GOAL #1   Title  Patient will be independent with initial HEP to improve functional outcomes    Time  3    Period  Weeks    Status  Achieved    Target Date  04/16/19        PT Long Term Goals - 04/16/19 0925      PT LONG TERM GOAL #1   Title  Patient will improve FOTO score to <50% to indicate improvement in functional outcomes    Time  6    Period  Weeks    Status  On-going      PT LONG TERM GOAL #2   Title  Patient will report at least 50% overall improvement in subjective complaint to indicate improvement in ability to perform ADLs.    Time  6    Period  Weeks    Status  On-going      PT LONG TERM GOAL #3   Title  Patient will be able to maintain tandem stance >30 seconds on BLEs to improve stability and reduce risk for falls    Baseline   04/16/19 L 19 seconds R 5 seconds    Time  6    Period  Weeks    Status  On-going      PT LONG TERM GOAL #4   Title  Patient will be able to perform stand x 5 in < 15 seconds to demonstrate improvement in functional mobility and reduced risk for falls.    Baseline  04/16/19 37.56 seconds    Time  6    Period  Weeks    Status  On-going            Plan - 04/19/19 1028    Clinical Impression Statement  Patient continues to be limited by fatigue during WB activity. Patient was able to resume standing ther ex with no increased complaint of pain in RT ankle. Patient had increased difficulty with balancing today. Patient had forward drift with sidestepping at line, and was unable to improve with cueing. Attempted tandem gait for dynamic balance activity, patient was unable to due to increased difficulty. Added NBOS on foam with head turns to address balance deficits. Patient did well but required intermittent HHA. Patient with slightly improved gait velocity today, with cues for increasing LT stride length. Patient shows improved return for 2 point sequencing using SPC.    Examination-Activity Limitations  Squat;Stairs;Stand;Lift;Locomotion Level;Bend;Transfers    Examination-Participation Restrictions  Community Activity    Stability/Clinical Decision Making  Stable/Uncomplicated    Rehab Potential  Good    PT Frequency  3x / week    PT Duration  6 weeks    PT Treatment/Interventions  ADLs/Self Care Home Management;Cryotherapy;Moist Heat;Ultrasound;Functional mobility training;Stair training;Neuromuscular re-education;Balance training;Gait training;DME Instruction;Therapeutic exercise;Therapeutic activities;Electrical Stimulation;Patient/family education;Orthotic Fit/Training;Compression bandaging;Taping;Spinal Manipulations;Passive range of motion;Manual techniques;Joint Manipulations;Vasopneumatic Device    PT Next Visit Plan  Continue to progress core and BLE strength as tolerated. Continue to  focus on balance and gait.    PT Home Exercise Plan  03/24/19: seated heelslides, abdominal bracing, sit to stands 04/16/19 squat with counter support 2x10       Patient will benefit from skilled therapeutic intervention in order  to improve the following deficits and impairments:  Abnormal gait, Decreased balance, Decreased mobility, Difficulty walking, Hypomobility, Improper body mechanics, Decreased range of motion, Decreased activity tolerance, Decreased strength, Pain, Impaired flexibility  Visit Diagnosis: Other abnormalities of gait and mobility  Acute right-sided low back pain with right-sided sciatica  Difficulty in walking, not elsewhere classified     Problem List Patient Active Problem List   Diagnosis Date Noted  . S/P right knee arthroscopy 01/28/2019 03/08/2019  . Derangement of posterior horn of medial meniscus of right knee   . Primary osteoarthritis of right knee   . Pain due to onychomycosis of toenails of both feet 12/07/2018  . Peripheral edema 12/01/2018  . Essential hypertension   . Chronic diastolic HF (heart failure) ([place])   . Cellulitis of leg, left 11/18/2018  . Ventral hernia without obstruction or gangrene 08/01/2017  . Mixed hyperlipidemia 02/27/2017  . Vitamin D deficiency 02/27/2017  . Hereditary hemochromatosis ([place]) 02/27/2017  . Atrial fibrillation, chronic 02/27/2017   10:31 AM, 04/19/19 Josue Hector PT DPT  Physical Therapist with [facility]  [phone] [place], [place], [address] Phone: [phone]   Fax:  [phone]  Name: Dakisha Cahalan MRN: [id number] Date of Birth: 12/31/1945

## 2019-04-21 ENCOUNTER — Encounter (HOSPITAL_COMMUNITY): Payer: Self-pay | Admitting: Physical Therapy

## 2019-04-21 ENCOUNTER — Ambulatory Visit (HOSPITAL_COMMUNITY): Payer: Medicare PPO | Admitting: Physical Therapy

## 2019-04-21 ENCOUNTER — Other Ambulatory Visit: Payer: Self-pay

## 2019-04-21 DIAGNOSIS — M25562 Pain in left knee: Secondary | ICD-10-CM | POA: Diagnosis not present

## 2019-04-21 DIAGNOSIS — M5441 Lumbago with sciatica, right side: Secondary | ICD-10-CM | POA: Diagnosis not present

## 2019-04-21 DIAGNOSIS — M25662 Stiffness of left knee, not elsewhere classified: Secondary | ICD-10-CM | POA: Diagnosis not present

## 2019-04-21 DIAGNOSIS — R262 Difficulty in walking, not elsewhere classified: Secondary | ICD-10-CM | POA: Diagnosis not present

## 2019-04-21 DIAGNOSIS — R2689 Other abnormalities of gait and mobility: Secondary | ICD-10-CM | POA: Diagnosis not present

## 2019-04-21 NOTE — Therapy (Signed)
[place], [place], [address] Phone: [phone]   Fax:  [phone]  Physical Therapy Treatment  Patient Details  Name: Amanda Mckay MRN: [id number] Date of Birth: [birth year] Referring Provider (PT): [name] MD   Encounter Date: 04/21/2019  PT End of Session - 04/21/19 0949    Visit Number  12    Number of Visits  18    Date for PT Re-Evaluation  05/07/19    Authorization Type  [employer]/ [employer] (No auth needed)    Authorization Time Period  03/22/19- 05/07/19    Authorization - Visit Number  2    Authorization - Number of Visits  10    PT Start Time  0946    PT Stop Time  1028    PT Time Calculation (min)  42 min    Equipment Utilized During Treatment  Other (comment);Gait belt   Rollator   Activity Tolerance  Patient tolerated treatment well;Patient limited by fatigue    Behavior During Therapy  [facility] for tasks assessed/performed       Past Medical History:  Diagnosis Date  . Arthritis   . Atrial fibrillation ([place])   . Cataract   . Dysrhythmia    AFib  . GERD (gastroesophageal reflux disease)   . Hemochromatosis   . Iron excess     Past Surgical History:  Procedure Laterality Date  . CESAREAN SECTION    . KNEE ARTHROSCOPY WITH MEDIAL MENISECTOMY Right 01/28/2019   Procedure: RIGHT KNEE ARTHROSCOPY WITH MEDIAL MENISCECTOMY;  Surgeon: Carole Civil, MD;  Location: [facility];  Service: Orthopedics;  Laterality: Right;  . TONSILLECTOMY    . VENTRAL HERNIA REPAIR N/A 01/26/2018   Procedure: HERNIA REPAIR VENTRAL ADULT WITH MESH;  Surgeon: Virl Cagey, MD;  Location: [facility];  Service: General;  Laterality: N/A;    There were no vitals filed for this visit.  Subjective Assessment - 04/21/19 0948    Subjective  Patient says her ankle is feeling better lately and does not bother her. Says this has made it easier getting around. Reports no pain currently.    Patient Stated Goals   Not be in pain and be able to walk, do more and not be unbalanced    Currently in Pain?  No/denies                       [facility] Adult PT Treatment/Exercise - 04/21/19 0001      Knee/Hip Exercises: Stretches   Knee: Self-Stretch to increase Flexion  Both;5 reps;10 seconds    Knee: Self-Stretch Limitations  on 12 inch step    Gastroc Stretch  Both;3 reps;30 seconds    Gastroc Stretch Limitations  slope       Knee/Hip Exercises: Standing   Hip Abduction  Both;15 reps    Hip Extension  Both;15 reps    Functional Squat  15 reps    Functional Squat Limitations  at // bars for HHA    Other Standing Knee Exercises  tandem stance 2x 30", intermittent HHA; NBOS on foam with head turns x10; box taps, 6 inch, x20 alternating with finger touch assist on bar    Other Standing Knee Exercises  1 RT sidestepping, 15'               PT Short Term Goals - 04/16/19 [id number]      PT SHORT TERM GOAL #1   Title  Patient will  be independent with initial HEP to improve functional outcomes    Time  3    Period  Weeks    Status  Achieved    Target Date  04/16/19        PT Long Term Goals - 04/16/19 0925      PT LONG TERM GOAL #1   Title  Patient will improve FOTO score to <50% to indicate improvement in functional outcomes    Time  6    Period  Weeks    Status  On-going      PT LONG TERM GOAL #2   Title  Patient will report at least 50% overall improvement in subjective complaint to indicate improvement in ability to perform ADLs.    Time  6    Period  Weeks    Status  On-going      PT LONG TERM GOAL #3   Title  Patient will be able to maintain tandem stance >30 seconds on BLEs to improve stability and reduce risk for falls    Baseline  04/16/19 L 19 seconds R 5 seconds    Time  6    Period  Weeks    Status  On-going      PT LONG TERM GOAL #4   Title  Patient will be able to perform stand x 5 in < 15 seconds to demonstrate improvement in functional mobility and reduced  risk for falls.    Baseline  04/16/19 37.56 seconds    Time  6    Period  Weeks    Status  On-going            Plan - 04/21/19 1027    Clinical Impression Statement  Patient tolerated session well today but did note increased fatigue post session. Patient reports no increased pain in RT ankle. Patient demos some improvement in gait, showing more consistent step length on LT. Does require intermittent cueing, but is doing better. Patient required verbal cues for hinging hips to initiated squatting at bars. Patient had some difficulty initially with added step taps on 6 inch box, but was able to improve with practice. Patient also with increased difficulty staying on line with sidestepping due to forward lean and balance. Patient was able to improve slightly with cueing, but continues to have difficulty with this.    Examination-Activity Limitations  Squat;Stairs;Stand;Lift;Locomotion Level;Bend;Transfers    Examination-Participation Restrictions  Community Activity    Stability/Clinical Decision Making  Stable/Uncomplicated    Rehab Potential  Good    PT Frequency  3x / week    PT Duration  6 weeks    PT Treatment/Interventions  ADLs/Self Care Home Management;Cryotherapy;Moist Heat;Ultrasound;Functional mobility training;Stair training;Neuromuscular re-education;Balance training;Gait training;DME Instruction;Therapeutic exercise;Therapeutic activities;Electrical Stimulation;Patient/family education;Orthotic Fit/Training;Compression bandaging;Taping;Spinal Manipulations;Passive range of motion;Manual techniques;Joint Manipulations;Vasopneumatic Device    PT Next Visit Plan  Continue to progress core and BLE strength as tolerated. Continue to focus on balance and gait.    PT Home Exercise Plan  03/24/19: seated heelslides, abdominal bracing, sit to stands 04/16/19 squat with counter support 2x10       Patient will benefit from skilled therapeutic intervention in order to improve the following  deficits and impairments:  Abnormal gait, Decreased balance, Decreased mobility, Difficulty walking, Hypomobility, Improper body mechanics, Decreased range of motion, Decreased activity tolerance, Decreased strength, Pain, Impaired flexibility  Visit Diagnosis: Other abnormalities of gait and mobility  Acute right-sided low back pain with right-sided sciatica  Difficulty in walking, not elsewhere classified  Problem List Patient Active Problem List   Diagnosis Date Noted  . S/P right knee arthroscopy 01/28/2019 03/08/2019  . Derangement of posterior horn of medial meniscus of right knee   . Primary osteoarthritis of right knee   . Pain due to onychomycosis of toenails of both feet 12/07/2018  . Peripheral edema 12/01/2018  . Essential hypertension   . Chronic diastolic HF (heart failure) ([place])   . Cellulitis of leg, left 11/18/2018  . Ventral hernia without obstruction or gangrene 08/01/2017  . Mixed hyperlipidemia 02/27/2017  . Vitamin D deficiency 02/27/2017  . Hereditary hemochromatosis ([place]) 02/27/2017  . Atrial fibrillation, chronic 02/27/2017   10:32 AM, 04/21/19 Josue Hector PT DPT  Physical Therapist with [facility]  [phone] [place] [address] [place], [place], [address] Phone: [phone]   Fax:  [phone]  Name: Monise Asato MRN: [id number] Date of Birth: 07-07-1945

## 2019-04-23 ENCOUNTER — Other Ambulatory Visit: Payer: Self-pay

## 2019-04-23 ENCOUNTER — Encounter (HOSPITAL_COMMUNITY): Payer: Self-pay

## 2019-04-23 ENCOUNTER — Ambulatory Visit (HOSPITAL_COMMUNITY): Payer: Medicare PPO

## 2019-04-23 DIAGNOSIS — M5441 Lumbago with sciatica, right side: Secondary | ICD-10-CM | POA: Diagnosis not present

## 2019-04-23 DIAGNOSIS — R262 Difficulty in walking, not elsewhere classified: Secondary | ICD-10-CM | POA: Diagnosis not present

## 2019-04-23 DIAGNOSIS — R2689 Other abnormalities of gait and mobility: Secondary | ICD-10-CM | POA: Diagnosis not present

## 2019-04-23 DIAGNOSIS — M25662 Stiffness of left knee, not elsewhere classified: Secondary | ICD-10-CM | POA: Diagnosis not present

## 2019-04-23 DIAGNOSIS — M25562 Pain in left knee: Secondary | ICD-10-CM | POA: Diagnosis not present

## 2019-04-23 NOTE — Therapy (Signed)
[place], [place], [address] Phone: [phone]   Fax:  [phone]  Physical Therapy Treatment  Patient Details  Name: Amanda Mckay MRN: [id number] Date of Birth: 08-Sep-1945 Referring Provider (PT): [name] MD   Encounter Date: 04/23/2019  PT End of Session - 04/23/19 0926    Visit Number  13    Number of Visits  18    Date for PT Re-Evaluation  05/07/19    Authorization Type  [employer]/ [employer] (No auth needed)    Authorization Time Period  03/22/19- 05/07/19    Authorization - Visit Number  3    Authorization - Number of Visits  10    PT Start Time  0922    PT Stop Time  1001    PT Time Calculation (min)  39 min    Equipment Utilized During Treatment  Other (comment);Gait belt   Rollator   Activity Tolerance  Patient tolerated treatment well;Patient limited by fatigue    Behavior During Therapy  [facility] for tasks assessed/performed       Past Medical History:  Diagnosis Date  . Arthritis   . Atrial fibrillation ([place])   . Cataract   . Dysrhythmia    AFib  . GERD (gastroesophageal reflux disease)   . Hemochromatosis   . Iron excess     Past Surgical History:  Procedure Laterality Date  . CESAREAN SECTION    . KNEE ARTHROSCOPY WITH MEDIAL MENISECTOMY Right 01/28/2019   Procedure: RIGHT KNEE ARTHROSCOPY WITH MEDIAL MENISCECTOMY;  Surgeon: Carole Civil, MD;  Location: [facility];  Service: Orthopedics;  Laterality: Right;  . TONSILLECTOMY    . VENTRAL HERNIA REPAIR N/A 01/26/2018   Procedure: HERNIA REPAIR VENTRAL ADULT WITH MESH;  Surgeon: Virl Cagey, MD;  Location: [facility];  Service: General;  Laterality: N/A;    There were no vitals filed for this visit.  Subjective Assessment - 04/23/19 0925    Subjective  Pt reports her ankle feels a lot better, able to walk around the house yesterday wiht LRAD.  No reports of pain today.    Patient Stated Goals  Not be in pain and be able  to walk, do more and not be unbalanced    Currently in Pain?  No/denies                       [facility] Adult PT Treatment/Exercise - 04/23/19 0001      Knee/Hip Exercises: Standing   Forward Lunges  10 reps    Forward Lunges Limitations  4in step    Hip Abduction  Both;2 sets;10 reps    Abduction Limitations  GTB    Functional Squat  15 reps    Functional Squat Limitations  at // bars for HHA    Other Standing Knee Exercises  tandem stance on foam 2x 30", tandem gait 2RT in // bars    Other Standing Knee Exercises  2RT sidestep GTB      Knee/Hip Exercises: Seated   Sit to Sand  2 sets;5 reps;without UE support               PT Short Term Goals - 04/16/19 [id number]      PT SHORT TERM GOAL #1   Title  Patient will be independent with initial HEP to improve functional outcomes    Time  3    Period  Weeks    Status  Achieved  Target Date  04/16/19        PT Long Term Goals - 04/16/19 0925      PT LONG TERM GOAL #1   Title  Patient will improve FOTO score to <50% to indicate improvement in functional outcomes    Time  6    Period  Weeks    Status  On-going      PT LONG TERM GOAL #2   Title  Patient will report at least 50% overall improvement in subjective complaint to indicate improvement in ability to perform ADLs.    Time  6    Period  Weeks    Status  On-going      PT LONG TERM GOAL #3   Title  Patient will be able to maintain tandem stance >30 seconds on BLEs to improve stability and reduce risk for falls    Baseline  04/16/19 L 19 seconds R 5 seconds    Time  6    Period  Weeks    Status  On-going      PT LONG TERM GOAL #4   Title  Patient will be able to perform stand x 5 in < 15 seconds to demonstrate improvement in functional mobility and reduced risk for falls.    Baseline  04/16/19 37.56 seconds    Time  6    Period  Weeks    Status  On-going            Plan - 04/23/19 1515    Clinical Impression Statement  Continued session  focus wiht LE functional strengthening and balance training.  Added theraband resistance around thigh during standing hip Abd and extension as well as during sidestep that did increase musculautre fatigue with resistance.  Added forward lunges for LE strengthening and tandem gait for balance with min A and intermittent HHA required.  No reports of pain at EOS, was limited by fatigue.    Examination-Activity Limitations  Squat;Stairs;Stand;Lift;Locomotion Level;Bend;Transfers    Examination-Participation Restrictions  Community Activity    Stability/Clinical Decision Making  Stable/Uncomplicated    Clinical Decision Making  Low    Rehab Potential  Good    PT Frequency  3x / week    PT Duration  6 weeks    PT Treatment/Interventions  ADLs/Self Care Home Management;Cryotherapy;Moist Heat;Ultrasound;Functional mobility training;Stair training;Neuromuscular re-education;Balance training;Gait training;DME Instruction;Therapeutic exercise;Therapeutic activities;Electrical Stimulation;Patient/family education;Orthotic Fit/Training;Compression bandaging;Taping;Spinal Manipulations;Passive range of motion;Manual techniques;Joint Manipulations;Vasopneumatic Device    PT Next Visit Plan  Continue to progress core and BLE strength as tolerated. Continue to focus on balance and gait.    PT Home Exercise Plan  03/24/19: seated heelslides, abdominal bracing, sit to stands 04/16/19 squat with counter support 2x10       Patient will benefit from skilled therapeutic intervention in order to improve the following deficits and impairments:  Abnormal gait, Decreased balance, Decreased mobility, Difficulty walking, Hypomobility, Improper body mechanics, Decreased range of motion, Decreased activity tolerance, Decreased strength, Pain, Impaired flexibility  Visit Diagnosis: Other abnormalities of gait and mobility  Acute right-sided low back pain with right-sided sciatica     Problem List Patient Active Problem List    Diagnosis Date Noted  . S/P right knee arthroscopy 01/28/2019 03/08/2019  . Derangement of posterior horn of medial meniscus of right knee   . Primary osteoarthritis of right knee   . Pain due to onychomycosis of toenails of both feet 12/07/2018  . Peripheral edema 12/01/2018  . Essential hypertension   . Chronic diastolic HF (heart  failure) ([place])   . Cellulitis of leg, left 11/18/2018  . Ventral hernia without obstruction or gangrene 08/01/2017  . Mixed hyperlipidemia 02/27/2017  . Vitamin D deficiency 02/27/2017  . Hereditary hemochromatosis ([place]) 02/27/2017  . Atrial fibrillation, chronic 02/27/2017   Ihor Austin, LPTA; [place]  Aldona Lento 04/23/2019, 3:22 PM  [place] [address] [place], [place], [address] Phone: [phone]   Fax:  [phone]  Name: Amanda Mckay MRN: [id number] Date of Birth: [birth year]

## 2019-04-26 ENCOUNTER — Other Ambulatory Visit: Payer: Self-pay

## 2019-04-26 ENCOUNTER — Ambulatory Visit (HOSPITAL_COMMUNITY): Payer: Medicare PPO | Admitting: Physical Therapy

## 2019-04-26 ENCOUNTER — Encounter (HOSPITAL_COMMUNITY): Payer: Self-pay | Admitting: Physical Therapy

## 2019-04-26 DIAGNOSIS — M25662 Stiffness of left knee, not elsewhere classified: Secondary | ICD-10-CM

## 2019-04-26 DIAGNOSIS — M25562 Pain in left knee: Secondary | ICD-10-CM

## 2019-04-26 DIAGNOSIS — R262 Difficulty in walking, not elsewhere classified: Secondary | ICD-10-CM | POA: Diagnosis not present

## 2019-04-26 DIAGNOSIS — M5441 Lumbago with sciatica, right side: Secondary | ICD-10-CM

## 2019-04-26 DIAGNOSIS — R2689 Other abnormalities of gait and mobility: Secondary | ICD-10-CM | POA: Diagnosis not present

## 2019-04-26 NOTE — Therapy (Signed)
[place], [place], [address] Phone: [phone]   Fax:  [phone]  Physical Therapy Treatment  Patient Details  Name: Amanda Mckay MRN: [id number] Date of Birth: 11-Apr-1945 Referring Provider (PT): [name] MD   Encounter Date: 04/26/2019  PT End of Session - 04/26/19 0916    Visit Number  14    Number of Visits  18    Date for PT Re-Evaluation  05/07/19    Authorization Type  [employer]/ [employer] (No auth needed)    Authorization Time Period  03/22/19- 05/07/19    Authorization - Visit Number  4    Authorization - Number of Visits  10    PT Start Time  0916    PT Stop Time  0956    PT Time Calculation (min)  40 min    Equipment Utilized During Treatment  Other (comment);Gait belt   Rollator   Activity Tolerance  Patient tolerated treatment well;Patient limited by fatigue    Behavior During Therapy  [facility] for tasks assessed/performed       Past Medical History:  Diagnosis Date  . Arthritis   . Atrial fibrillation ([place])   . Cataract   . Dysrhythmia    AFib  . GERD (gastroesophageal reflux disease)   . Hemochromatosis   . Iron excess     Past Surgical History:  Procedure Laterality Date  . CESAREAN SECTION    . KNEE ARTHROSCOPY WITH MEDIAL MENISECTOMY Right 01/28/2019   Procedure: RIGHT KNEE ARTHROSCOPY WITH MEDIAL MENISCECTOMY;  Surgeon: Carole Civil, MD;  Location: [facility];  Service: Orthopedics;  Laterality: Right;  . TONSILLECTOMY    . VENTRAL HERNIA REPAIR N/A 01/26/2018   Procedure: HERNIA REPAIR VENTRAL ADULT WITH MESH;  Surgeon: Virl Cagey, MD;  Location: [facility];  Service: General;  Laterality: N/A;    There were no vitals filed for this visit.  Subjective Assessment - 04/26/19 0918    Subjective  States her pain is pretty good today, states her knee pain is about a 5/10 and that she put cream on it prior to PT and it is not that bad. States she is walking with a  cane at home and it depends on how she feels when she wakes up . Usually as the day gets on she can decrease use of rollator and cane at home.    Patient Stated Goals  Not be in pain and be able to walk, do more and not be unbalanced         [facility] PT Assessment - 04/26/19 0001      Assessment   Medical Diagnosis  Low back pain with RT radiculopathy    Referring Provider (PT)  [name] MD    Onset Date/Surgical Date  01/31/19    Next MD Visit  05/05/19                   [facility] Adult PT Treatment/Exercise - 04/26/19 0001      Lumbar Exercises: Standing   Other Standing Lumbar Exercises  tandem in bars 4x 30" Bilateral - focus on using muscle not arms to correct for balance     Other Standing Lumbar Exercises  standign endurance - normal BOs - 3x 2 minutes       Lumbar Exercises: Seated   Sit to Stand  5 reps   4 sets, (chair +blue foam) cues to press feet into floor  PT Education - 04/26/19 0957    Education Details  focused on educating patient on appropriate way to activate muscles and transition from sit to stand. Educated patietn on pressing into floor and sitting back into chair. Educated patient on using muscles to correct for arms and not UEs to help build up strength and balance while being safe. Educated patient on standing endurance program - starting at 2 minutes without holding on to anything or leaning onto anything and to perform 3-5x a day.    Person(s) Educated  Patient    Methods  Explanation;Demonstration    Comprehension  Verbalized understanding;Returned demonstration       PT Short Term Goals - 04/16/19 0924      PT SHORT TERM GOAL #1   Title  Patient will be independent with initial HEP to improve functional outcomes    Time  3    Period  Weeks    Status  Achieved    Target Date  04/16/19        PT Long Term Goals - 04/16/19 0925      PT LONG TERM GOAL #1   Title  Patient will improve FOTO score to <50% to  indicate improvement in functional outcomes    Time  6    Period  Weeks    Status  On-going      PT LONG TERM GOAL #2   Title  Patient will report at least 50% overall improvement in subjective complaint to indicate improvement in ability to perform ADLs.    Time  6    Period  Weeks    Status  On-going      PT LONG TERM GOAL #3   Title  Patient will be able to maintain tandem stance >30 seconds on BLEs to improve stability and reduce risk for falls    Baseline  04/16/19 L 19 seconds R 5 seconds    Time  6    Period  Weeks    Status  On-going      PT LONG TERM GOAL #4   Title  Patient will be able to perform stand x 5 in < 15 seconds to demonstrate improvement in functional mobility and reduced risk for falls.    Baseline  04/16/19 37.56 seconds    Time  6    Period  Weeks    Status  On-going            Plan - 04/26/19 0959    Clinical Impression Statement  Focused on refining current exercises to improve optimal muscle activation and ability to improve balance, strength and endurance. Patient very satisfied with education provided and things to focus on with exercises. Discussed focusing on tasks she wants to improve to improve her functional standing endurance, balance and transitional movement. Patient would continue to benefit from skilled physical therapy.    Examination-Activity Limitations  Squat;Stairs;Stand;Lift;Locomotion Level;Bend;Transfers    Examination-Participation Restrictions  Community Activity    Stability/Clinical Decision Making  Stable/Uncomplicated    Rehab Potential  Good    PT Frequency  3x / week    PT Duration  6 weeks    PT Treatment/Interventions  ADLs/Self Care Home Management;Cryotherapy;Moist Heat;Ultrasound;Functional mobility training;Stair training;Neuromuscular re-education;Balance training;Gait training;DME Instruction;Therapeutic exercise;Therapeutic activities;Electrical Stimulation;Patient/family education;Orthotic Fit/Training;Compression  bandaging;Taping;Spinal Manipulations;Passive range of motion;Manual techniques;Joint Manipulations;Vasopneumatic Device    PT Next Visit Plan  focus on balance, walking and transitional strength    PT Home Exercise Plan  03/24/19: seated heelslides, abdominal bracing, sit to stands  04/16/19 squat with counter support 2x10       Patient will benefit from skilled therapeutic intervention in order to improve the following deficits and impairments:  Abnormal gait, Decreased balance, Decreased mobility, Difficulty walking, Hypomobility, Improper body mechanics, Decreased range of motion, Decreased activity tolerance, Decreased strength, Pain, Impaired flexibility  Visit Diagnosis: Other abnormalities of gait and mobility  Acute right-sided low back pain with right-sided sciatica  Difficulty in walking, not elsewhere classified  Acute pain of left knee  Stiffness of left knee, not elsewhere classified     Problem List Patient Active Problem List   Diagnosis Date Noted  . S/P right knee arthroscopy 01/28/2019 03/08/2019  . Derangement of posterior horn of medial meniscus of right knee   . Primary osteoarthritis of right knee   . Pain due to onychomycosis of toenails of both feet 12/07/2018  . Peripheral edema 12/01/2018  . Essential hypertension   . Chronic diastolic HF (heart failure) ([place])   . Cellulitis of leg, left 11/18/2018  . Ventral hernia without obstruction or gangrene 08/01/2017  . Mixed hyperlipidemia 02/27/2017  . Vitamin D deficiency 02/27/2017  . Hereditary hemochromatosis ([place]) 02/27/2017  . Atrial fibrillation, chronic 02/27/2017    10:01 AM, 04/26/19 Jerene Pitch, DPT Physical Therapy with [facility]  [phone] office  [place] [address] [place], [place], [address] Phone: [phone]   Fax:  [phone]  Name: Amanda Mckay MRN: [id number] Date of Birth: [birth year]

## 2019-04-28 ENCOUNTER — Other Ambulatory Visit: Payer: Self-pay

## 2019-04-28 ENCOUNTER — Encounter (HOSPITAL_COMMUNITY): Payer: Self-pay | Admitting: Physical Therapy

## 2019-04-28 ENCOUNTER — Ambulatory Visit (HOSPITAL_COMMUNITY): Payer: Medicare PPO | Admitting: Physical Therapy

## 2019-04-28 DIAGNOSIS — M5441 Lumbago with sciatica, right side: Secondary | ICD-10-CM

## 2019-04-28 DIAGNOSIS — M25562 Pain in left knee: Secondary | ICD-10-CM

## 2019-04-28 DIAGNOSIS — R2689 Other abnormalities of gait and mobility: Secondary | ICD-10-CM

## 2019-04-28 DIAGNOSIS — M25662 Stiffness of left knee, not elsewhere classified: Secondary | ICD-10-CM | POA: Diagnosis not present

## 2019-04-28 DIAGNOSIS — R262 Difficulty in walking, not elsewhere classified: Secondary | ICD-10-CM | POA: Diagnosis not present

## 2019-04-28 NOTE — Therapy (Signed)
[place], [place], [address] Phone: [phone]   Fax:  [phone]  Physical Therapy Treatment  Patient Details  Name: Amanda Mckay MRN: [id number] Date of Birth: 1945-04-06 Referring Provider (PT): [name] MD   Encounter Date: 04/28/2019  PT End of Session - 04/28/19 0954    Visit Number  15    Number of Visits  18    Date for PT Re-Evaluation  05/07/19    Authorization Type  [employer]/ [employer] (No auth needed)    Authorization Time Period  03/22/19- 05/07/19    Authorization - Visit Number  5    Authorization - Number of Visits  10    PT Start Time  0950    PT Stop Time  1030    PT Time Calculation (min)  40 min    Equipment Utilized During Treatment  Other (comment);Gait belt   Rollator, quad cane   Activity Tolerance  Patient tolerated treatment well    Behavior During Therapy  [facility] for tasks assessed/performed       Past Medical History:  Diagnosis Date  . Arthritis   . Atrial fibrillation ([place])   . Cataract   . Dysrhythmia    AFib  . GERD (gastroesophageal reflux disease)   . Hemochromatosis   . Iron excess     Past Surgical History:  Procedure Laterality Date  . CESAREAN SECTION    . KNEE ARTHROSCOPY WITH MEDIAL MENISECTOMY Right 01/28/2019   Procedure: RIGHT KNEE ARTHROSCOPY WITH MEDIAL MENISCECTOMY;  Surgeon: Carole Civil, MD;  Location: [facility];  Service: Orthopedics;  Laterality: Right;  . TONSILLECTOMY    . VENTRAL HERNIA REPAIR N/A 01/26/2018   Procedure: HERNIA REPAIR VENTRAL ADULT WITH MESH;  Surgeon: Virl Cagey, MD;  Location: [facility];  Service: General;  Laterality: N/A;    There were no vitals filed for this visit.  Subjective Assessment - 04/28/19 0956    Subjective  Patient says her ankle is much better has not bothered her but her RT knee started hurting over the weekend. Not sure why. Aching all over, about 7/10.    Patient Stated Goals  Not be in  pain and be able to walk, do more and not be unbalanced    Currently in Pain?  Yes    Pain Score  7     Pain Location  Knee    Pain Orientation  Right    Pain Descriptors / Indicators  Aching                       [facility] Adult PT Treatment/Exercise - 04/28/19 0001      Knee/Hip Exercises: Stretches   Knee: Self-Stretch to increase Flexion  Both;5 reps;10 seconds    Gastroc Stretch  Both;3 reps;30 seconds    Gastroc Stretch Limitations  slope       Knee/Hip Exercises: Standing   Heel Raises Limitations  attempted, too difficult    Hip Abduction  Both;15 reps    Abduction Limitations  RTB    Hip Extension  Both;15 reps    Extension Limitations  RTB    Gait Training  200 feet with quad cane, cues for sequenc and cane approximation     Other Standing Knee Exercises  tandem stance on foam 2x 30"    Other Standing Knee Exercises  tandem gait 15', 1 RT      Knee/Hip Exercises: Seated   Sit  to Sand  2 sets;10 reps;without UE support   elevated seat on foam               PT Short Term Goals - 04/16/19 0924      PT SHORT TERM GOAL #1   Title  Patient will be independent with initial HEP to improve functional outcomes    Time  3    Period  Weeks    Status  Achieved    Target Date  04/16/19        PT Long Term Goals - 04/16/19 0925      PT LONG TERM GOAL #1   Title  Patient will improve FOTO score to <50% to indicate improvement in functional outcomes    Time  6    Period  Weeks    Status  On-going      PT LONG TERM GOAL #2   Title  Patient will report at least 50% overall improvement in subjective complaint to indicate improvement in ability to perform ADLs.    Time  6    Period  Weeks    Status  On-going      PT LONG TERM GOAL #3   Title  Patient will be able to maintain tandem stance >30 seconds on BLEs to improve stability and reduce risk for falls    Baseline  04/16/19 L 19 seconds R 5 seconds    Time  6    Period  Weeks    Status  On-going       PT LONG TERM GOAL #4   Title  Patient will be able to perform stand x 5 in < 15 seconds to demonstrate improvement in functional mobility and reduced risk for falls.    Baseline  04/16/19 37.56 seconds    Time  6    Period  Weeks    Status  On-going            Plan - 04/28/19 1036    Clinical Impression Statement  Patient domes good tolerance today. Able to perform tandem gait with improved approximation to blue line with no AD. Able to ambulate with feet just outside line. Patient asked about SPC vs quad cane for stability, so we practiced gait training with quad cane. Patient reported improved stability using quad cane. She required cues for cane approximation (tends to put cane far ahead) and for sequencing for proper 2 point gait pattern. Patient reported no increased pain today. Able to progress to RTB with hip abd and extension with mild muscle fatigue.    Examination-Activity Limitations  Squat;Stairs;Stand;Lift;Locomotion Level;Bend;Transfers    Examination-Participation Restrictions  Community Activity    Stability/Clinical Decision Making  Stable/Uncomplicated    Rehab Potential  Good    PT Frequency  3x / week    PT Duration  6 weeks    PT Treatment/Interventions  ADLs/Self Care Home Management;Cryotherapy;Moist Heat;Ultrasound;Functional mobility training;Stair training;Neuromuscular re-education;Balance training;Gait training;DME Instruction;Therapeutic exercise;Therapeutic activities;Electrical Stimulation;Patient/family education;Orthotic Fit/Training;Compression bandaging;Taping;Spinal Manipulations;Passive range of motion;Manual techniques;Joint Manipulations;Vasopneumatic Device    PT Next Visit Plan  focus on balance, walking and transitional strength    PT Home Exercise Plan  03/24/19: seated heelslides, abdominal bracing, sit to stands 04/16/19 squat with counter support 2x10       Patient will benefit from skilled therapeutic intervention in order to improve the  following deficits and impairments:  Abnormal gait, Decreased balance, Decreased mobility, Difficulty walking, Hypomobility, Improper body mechanics, Decreased range of motion, Decreased activity tolerance, Decreased strength, Pain,  Impaired flexibility  Visit Diagnosis: Other abnormalities of gait and mobility  Acute right-sided low back pain with right-sided sciatica  Difficulty in walking, not elsewhere classified  Acute pain of left knee  Stiffness of left knee, not elsewhere classified     Problem List Patient Active Problem List   Diagnosis Date Noted  . S/P right knee arthroscopy 01/28/2019 03/08/2019  . Derangement of posterior horn of medial meniscus of right knee   . Primary osteoarthritis of right knee   . Pain due to onychomycosis of toenails of both feet 12/07/2018  . Peripheral edema 12/01/2018  . Essential hypertension   . Chronic diastolic HF (heart failure) ([place])   . Cellulitis of leg, left 11/18/2018  . Ventral hernia without obstruction or gangrene 08/01/2017  . Mixed hyperlipidemia 02/27/2017  . Vitamin D deficiency 02/27/2017  . Hereditary hemochromatosis ([place]) 02/27/2017  . Atrial fibrillation, chronic 02/27/2017   10:37 AM, 04/28/19 Josue Hector PT DPT  Physical Therapist with [facility]  [phone] [facility] [address] [place], [place], [address] Phone: [phone]   Fax:  [phone]  Name: Amanda Mckay MRN: [id number] Date of Birth: 10/23/1945

## 2019-04-30 ENCOUNTER — Ambulatory Visit (HOSPITAL_COMMUNITY): Payer: Medicare PPO

## 2019-04-30 ENCOUNTER — Other Ambulatory Visit: Payer: Self-pay

## 2019-04-30 ENCOUNTER — Encounter (HOSPITAL_COMMUNITY): Payer: Self-pay

## 2019-04-30 DIAGNOSIS — M5441 Lumbago with sciatica, right side: Secondary | ICD-10-CM

## 2019-04-30 DIAGNOSIS — R2689 Other abnormalities of gait and mobility: Secondary | ICD-10-CM

## 2019-04-30 DIAGNOSIS — M25562 Pain in left knee: Secondary | ICD-10-CM | POA: Diagnosis not present

## 2019-04-30 DIAGNOSIS — R262 Difficulty in walking, not elsewhere classified: Secondary | ICD-10-CM | POA: Diagnosis not present

## 2019-04-30 DIAGNOSIS — M25662 Stiffness of left knee, not elsewhere classified: Secondary | ICD-10-CM | POA: Diagnosis not present

## 2019-04-30 NOTE — Therapy (Signed)
[place], [place], [address] Phone: [phone]   Fax:  [phone]  Physical Therapy Treatment  Patient Details  Name: Amanda Mckay MRN: [id number] Date of Birth: 05-29-1945 Referring Provider (PT): [name] MD   Encounter Date: 04/30/2019  PT End of Session - 04/30/19 1004    Visit Number  16    Number of Visits  18    Date for PT Re-Evaluation  05/07/19    Authorization Type  [employer]/ [id number] (No auth needed)    Authorization Time Period  03/22/19- 05/07/19    Authorization - Visit Number  6    Authorization - Number of Visits  10    PT Start Time  0918    PT Stop Time  1001    PT Time Calculation (min)  43 min    Equipment Utilized During Treatment  Other (comment);Gait belt   Rollator, SBQC, Gait belt   Activity Tolerance  Patient tolerated treatment well;Patient limited by fatigue    Behavior During Therapy  [facility] for tasks assessed/performed       Past Medical History:  Diagnosis Date  . Arthritis   . Atrial fibrillation ([place])   . Cataract   . Dysrhythmia    AFib  . GERD (gastroesophageal reflux disease)   . Hemochromatosis   . Iron excess     Past Surgical History:  Procedure Laterality Date  . CESAREAN SECTION    . KNEE ARTHROSCOPY WITH MEDIAL MENISECTOMY Right 01/28/2019   Procedure: RIGHT KNEE ARTHROSCOPY WITH MEDIAL MENISCECTOMY;  Surgeon: Carole Civil, MD;  Location: [facility];  Service: Orthopedics;  Laterality: Right;  . TONSILLECTOMY    . VENTRAL HERNIA REPAIR N/A 01/26/2018   Procedure: HERNIA REPAIR VENTRAL ADULT WITH MESH;  Surgeon: Virl Cagey, MD;  Location: [facility];  Service: General;  Laterality: N/A;    There were no vitals filed for this visit.  Subjective Assessment - 04/30/19 0921    Subjective  Pt reports she is feeling good, a little stiff today.    Patient Stated Goals  Not be in pain and be able to walk, do more and not be unbalanced    Currently in Pain?  Yes    Pain Score  7     Pain Location  Knee    Pain Orientation  Right    Pain Descriptors / Indicators  Aching;Tightness    Pain Type  Acute pain    Pain Onset  In the past 7 days    Pain Frequency  Intermittent    Aggravating Factors   standing, bending, walking    Pain Relieving Factors  sitting    Effect of Pain on Daily Activities  limits                       [facility] Adult PT Treatment/Exercise - 04/30/19 0001      Knee/Hip Exercises: Standing   Hip Abduction  Both;15 reps    Abduction Limitations  RTB    Hip Extension  Both;15 reps    Extension Limitations  RTB    Gait Training  200 feet with quad cane, cues for sequenc and cane approximation     Other Standing Knee Exercises  tandem stance on foam 2x 30"; tandem gait 1RT down blue line no AD    Other Standing Knee Exercises  sidestep 1RT no AD      Knee/Hip Exercises: Seated   Other  Seated Knee/Hip Exercises  RTB plantar flexion 15x BLE    Sit to Sand  10 reps;without UE support               PT Short Term Goals - 04/16/19 0924      PT SHORT TERM GOAL #1   Title  Patient will be independent with initial HEP to improve functional outcomes    Time  3    Period  Weeks    Status  Achieved    Target Date  04/16/19        PT Long Term Goals - 04/16/19 0925      PT LONG TERM GOAL #1   Title  Patient will improve FOTO score to <50% to indicate improvement in functional outcomes    Time  6    Period  Weeks    Status  On-going      PT LONG TERM GOAL #2   Title  Patient will report at least 50% overall improvement in subjective complaint to indicate improvement in ability to perform ADLs.    Time  6    Period  Weeks    Status  On-going      PT LONG TERM GOAL #3   Title  Patient will be able to maintain tandem stance >30 seconds on BLEs to improve stability and reduce risk for falls    Baseline  04/16/19 L 19 seconds R 5 seconds    Time  6    Period  Weeks    Status   On-going      PT LONG TERM GOAL #4   Title  Patient will be able to perform stand x 5 in < 15 seconds to demonstrate improvement in functional mobility and reduced risk for falls.    Baseline  04/16/19 37.56 seconds    Time  6    Period  Weeks    Status  On-going            Plan - 04/30/19 1404    Clinical Impression Statement  Continued gait training wiht SBQC wiht cueing for cane placement (tendency to place cane too far ahead without all 4 prongs on floor), pt improved sequence and able to increase cadence with 2 point sequence and no LOB episodes.  Continued wiht theraband resistance for hip movements with additional resistance during seated plantarflexion, noted increased muscle fatigue with resistance.  No reports of increased pain through session.  Pt given theraband and printout to add ankle strengthening to HEP.    Examination-Activity Limitations  Squat;Stairs;Stand;Lift;Locomotion Level;Bend;Transfers    Examination-Participation Restrictions  Community Activity    Stability/Clinical Decision Making  Stable/Uncomplicated    Clinical Decision Making  Low    Rehab Potential  Good    PT Frequency  3x / week    PT Duration  6 weeks    PT Treatment/Interventions  ADLs/Self Care Home Management;Cryotherapy;Moist Heat;Ultrasound;Functional mobility training;Stair training;Neuromuscular re-education;Balance training;Gait training;DME Instruction;Therapeutic exercise;Therapeutic activities;Electrical Stimulation;Patient/family education;Orthotic Fit/Training;Compression bandaging;Taping;Spinal Manipulations;Passive range of motion;Manual techniques;Joint Manipulations;Vasopneumatic Device    PT Next Visit Plan  focus on balance, walking and transitional strength    PT Home Exercise Plan  03/24/19: seated heelslides, abdominal bracing, sit to stands 04/16/19 squat with counter support 2x10; 2/12: theranband hip abd/ext and seated plantarflexion.       Patient will benefit from skilled  therapeutic intervention in order to improve the following deficits and impairments:  Abnormal gait, Decreased balance, Decreased mobility, Difficulty walking, Hypomobility, Improper body mechanics, Decreased range  of motion, Decreased activity tolerance, Decreased strength, Pain, Impaired flexibility  Visit Diagnosis: Other abnormalities of gait and mobility  Acute right-sided low back pain with right-sided sciatica     Problem List Patient Active Problem List   Diagnosis Date Noted  . S/P right knee arthroscopy 01/28/2019 03/08/2019  . Derangement of posterior horn of medial meniscus of right knee   . Primary osteoarthritis of right knee   . Pain due to onychomycosis of toenails of both feet 12/07/2018  . Peripheral edema 12/01/2018  . Essential hypertension   . Chronic diastolic HF (heart failure) ([place])   . Cellulitis of leg, left 11/18/2018  . Ventral hernia without obstruction or gangrene 08/01/2017  . Mixed hyperlipidemia 02/27/2017  . Vitamin D deficiency 02/27/2017  . Hereditary hemochromatosis ([place]) 02/27/2017  . Atrial fibrillation, chronic 02/27/2017   Ihor Austin, LPTA; [place]  Aldona Lento 04/30/2019, 2:12 PM  [place], [place], [address] Phone: [phone]   Fax:  [phone]  Name: Amanda Mckay MRN: [id number] Date of Birth: [birth year]

## 2019-04-30 NOTE — Patient Instructions (Signed)
Plantarflexion (Eccentric), (Resistance Band)    Point foot down against resistance band. Slowly release for 3-5 seconds. Use red resistance band. 10 reps per set, 2 sets per day, 4 days per week.  http://ecce.exer.us/3   Copyright  VHI. All rights reserved.   ABDUCTION: Standing (Active)    Stand, feet flat. Lift right leg out to side. Tie red theraband around thigh Complete 2 sets of 10 repetitions. .  http://gtsc.exer.us/111   Copyright  VHI. All rights reserved.

## 2019-05-04 ENCOUNTER — Encounter: Payer: Self-pay | Admitting: Family Medicine

## 2019-05-04 ENCOUNTER — Ambulatory Visit: Payer: Medicare Other | Admitting: Family Medicine

## 2019-05-04 ENCOUNTER — Ambulatory Visit (INDEPENDENT_AMBULATORY_CARE_PROVIDER_SITE_OTHER): Payer: Medicare PPO | Admitting: Family Medicine

## 2019-05-04 ENCOUNTER — Other Ambulatory Visit: Payer: Self-pay

## 2019-05-04 VITALS — BP 138/70 | HR 96 | Temp 98.7°F | Resp 14 | Wt 247.0 lb

## 2019-05-04 DIAGNOSIS — R609 Edema, unspecified: Secondary | ICD-10-CM | POA: Diagnosis not present

## 2019-05-04 DIAGNOSIS — E782 Mixed hyperlipidemia: Secondary | ICD-10-CM

## 2019-05-04 DIAGNOSIS — I482 Chronic atrial fibrillation, unspecified: Secondary | ICD-10-CM

## 2019-05-04 DIAGNOSIS — I5032 Chronic diastolic (congestive) heart failure: Secondary | ICD-10-CM

## 2019-05-04 NOTE — Progress Notes (Signed)
   Subjective:    Patient ID: Amanda Mckay, female    DOB: [birth year], 74 y.o.   MRN: [id number]  Patient presents for Follow-up (Medications )  PAF- currently controlled, BP controlled, f/u cardiology this week for telehealth.  She has not had any chest pain palpitations or shortness of breath.   OA knee- recommend she f/u ortho and have MRI done, can use topical NSAID, tylenol, sever pain ultram  CHF-he does take Lasix most days.  She has noticed increased swelling on her right lower extremity the past month or so worsened after she had her knee surgery.  She does not have any pain in the leg or knee.  She does get swelling in the left foot as well just not as much as the right.  I did give her compression hose the last visit in October but she states they were a little difficult to get on that she has not been wearing them.  Welling does go down some if she props her legs up.    Hereditary Hemochromatosis last check on her ferritin level and iron were normal  Hyperlipidemia noted on labs in Oct,recommended have a statin but she want to work on dietary changes and recheck her labs first. TG were  226 , LDL 127  COVID 19 vaccine this Thursday          Review Of Systems:  GEN- denies fatigue, fever, weight loss,weakness, recent illness HEENT- denies eye drainage, change in vision, nasal discharge, CVS- denies chest pain, palpitations RESP- denies SOB, cough, wheeze ABD- denies N/V, change in stools, abd pain GU- denies dysuria, hematuria, dribbling, incontinence MSK- denies joint pain, muscle aches, injury Neuro- denies headache, dizziness, syncope, seizure activity       Objective:    BP 138/70   Pulse 96   Temp 98.7 F (37.1 C)   Resp 14   Wt 247 lb (112 kg)   SpO2 100%   BMI 45.18 kg/m  GEN- NAD, alert and oriented x3 weight up 8 pounds since October HEENT- PERRL, EOMI, non injected sclera, pink conjunctiva, Neck- Supple, no JVD CVS- RRR, no  murmur RESP-CTAB ABD-NABS,soft,NT,ND EXT- RLE  Edema 1+ left lower extremity pedal edema Pulses- Radial, DP-elevated        Assessment & Plan:      Problem List Items Addressed This Visit      Unprioritized   Atrial fibrillation, chronic - Primary    She is currently in sinus rhythm.  She has follow-up with cardiology this week.  Her blood pressure is controlled.      Chronic diastolic HF (heart failure) (HCC)    He has congestive heart failure she is not having any respiratory symptoms but she does have peripheral edema.  I do think the edema is worse in the thigh that she had her knee surgery on that there is no overwhelming sign of DVT.  Advised her to try to get the compression hose on she states that her husband can help her put these on.  We will also have her increase her Lasix to 40 mg for the next 3 days.      Hereditary hemochromatosis ([place])   Mixed hyperlipidemia    She will return for fasting lab      Peripheral edema      Note: This dictation was prepared with Dragon dictation along with smaller phrase technology. Any transcriptional errors that result from this process are unintentional.

## 2019-05-04 NOTE — Patient Instructions (Addendum)
F/U 4 months  Increase lasix to 40mg  for the next 3 days Return for fasting labs

## 2019-05-04 NOTE — Assessment & Plan Note (Signed)
He has congestive heart failure she is not having any respiratory symptoms but she does have peripheral edema.  I do think the edema is worse in the thigh that she had her knee surgery on that there is no overwhelming sign of DVT.  Advised her to try to get the compression hose on she states that her husband can help her put these on.  We will also have her increase her Lasix to 40 mg for the next 3 days.

## 2019-05-04 NOTE — Assessment & Plan Note (Signed)
She will return for fasting lab

## 2019-05-04 NOTE — Assessment & Plan Note (Signed)
She is currently in sinus rhythm.  She has follow-up with cardiology this week.  Her blood pressure is controlled.

## 2019-05-05 ENCOUNTER — Ambulatory Visit: Payer: Medicare PPO | Admitting: Orthopedic Surgery

## 2019-05-05 ENCOUNTER — Encounter: Payer: Self-pay | Admitting: Orthopedic Surgery

## 2019-05-05 VITALS — BP 137/77 | HR 90 | Ht 62.0 in | Wt 247.0 lb

## 2019-05-05 DIAGNOSIS — M5410 Radiculopathy, site unspecified: Secondary | ICD-10-CM

## 2019-05-05 DIAGNOSIS — Z9889 Other specified postprocedural states: Secondary | ICD-10-CM | POA: Diagnosis not present

## 2019-05-05 NOTE — Patient Instructions (Signed)
Home exercises continue   Use walker outside the house   Continue diclofenac

## 2019-05-05 NOTE — Progress Notes (Signed)
Chief Complaint  Patient presents with  . Back Pain    finished therapy doing home exercises  . Knee Pain    01/28/19 knee scope, feels better     Encounter Diagnoses  Name Primary?  . S/P right knee arthroscopy 01/28/2019 Yes  . Radicular pain of right lower extremity     Patient status post arthroscopy of the knee developed back pain went to therapy.  She said the therapy did help her although she is come to rely on her walker when she is outside of the house.  She can function well without the walker inside the house  Most of her symptoms are significantly improved related to her back and knee  Recommend continue with home exercises, use the walker when she is outside the house continue her diclofenac  Follow-up with [place] as needed

## 2019-05-06 ENCOUNTER — Ambulatory Visit: Payer: Medicare Other | Admitting: Podiatry

## 2019-05-07 ENCOUNTER — Telehealth (INDEPENDENT_AMBULATORY_CARE_PROVIDER_SITE_OTHER): Payer: Medicare PPO | Admitting: Cardiovascular Disease

## 2019-05-07 ENCOUNTER — Encounter: Payer: Self-pay | Admitting: Cardiovascular Disease

## 2019-05-07 VITALS — BP 138/70 | HR 90 | Ht 68.0 in | Wt 247.0 lb

## 2019-05-07 DIAGNOSIS — Z5181 Encounter for therapeutic drug level monitoring: Secondary | ICD-10-CM

## 2019-05-07 DIAGNOSIS — I480 Paroxysmal atrial fibrillation: Secondary | ICD-10-CM | POA: Diagnosis not present

## 2019-05-07 DIAGNOSIS — Z79899 Other long term (current) drug therapy: Secondary | ICD-10-CM

## 2019-05-07 NOTE — Addendum Note (Signed)
Addended by: Merlene Laughter on: 05/07/2019 10:20 AM   Modules accepted: Orders

## 2019-05-07 NOTE — Patient Instructions (Signed)
Medication Instructions:   Your physician recommends that you continue on your current medications as directed. Please refer to the Current Medication list given to you today.  Labwork:  Your physician recommends that you return for non-fasting lab work as soon as possible to check your flecainide level. You may have this done at [facility] in [place]. Your lab order has been faxed. After flecainide level is checked-it will be determined if you will stop flecianide.  Testing/Procedures:  NONE  Follow-Up:  Your physician recommends that you schedule a follow-up appointment in: 1 year (virtual). You will receive a reminder letter in the mail in about 10 months reminding you to call and schedule your appointment. If you don't receive this letter, please contact our office.  Any Other Special Instructions Will Be Listed Below (If Applicable).  If you need a refill on your cardiac medications before your next appointment, please call your pharmacy.

## 2019-05-07 NOTE — Progress Notes (Signed)
Virtual Visit via Telephone Note   This visit type was conducted due to national recommendations for restrictions regarding the COVID-19 Pandemic (e.g. social distancing) in an effort to limit this patient's exposure and mitigate transmission in our community.  Due to her co-morbid illnesses, this patient is at least at moderate risk for complications without adequate follow up.  This format is felt to be most appropriate for this patient at this time.  The patient did not have access to video technology/had technical difficulties with video requiring transitioning to audio format only (telephone).  All issues noted in this document were discussed and addressed.  No physical exam could be performed with this format.  Please refer to the patient's chart for her  consent to telehealth for [facility].   Date:  05/07/2019   ID:  Amanda Mckay, DOB 1945/09/30, MRN [id number]  Patient Location: Home Provider Location: Home  PCP:  Alycia Rossetti, MD  Cardiologist:  Kate Sable, MD  Electrophysiologist:  None   Evaluation Performed:  Follow-Up Visit  Chief Complaint: Atrial fibrillation  History of Present Illness:    Amanda Mckay is a 74 y.o. female with paroxysmal atrial fibrillation and hemochromatosis.  I have evaluated this patient once before in January 2019.  She denies chest pain, palpitations, dizziness, orthopnea, and paroxysmal nocturnal dyspnea.  She had some issues with cellulitis of the legs and had some leg swelling for which she takes Lasix as needed.  She has also been prescribed compression stockings.  She had questions about her medication regimen including flecainide.  We talked about potentially discontinuing it.  She also had questions about taking the COVID-19 vaccine as she and her husband are presently signed up for it.  The patient does not have symptoms concerning for COVID-19 infection (fever, chills, cough, or new shortness of breath).    Past  Medical History:  Diagnosis Date  . Arthritis   . Atrial fibrillation ([place])   . Cataract   . Dysrhythmia    AFib  . GERD (gastroesophageal reflux disease)   . Hemochromatosis   . Iron excess    Past Surgical History:  Procedure Laterality Date  . CESAREAN SECTION    . KNEE ARTHROSCOPY WITH MEDIAL MENISECTOMY Right 01/28/2019   Procedure: RIGHT KNEE ARTHROSCOPY WITH MEDIAL MENISCECTOMY;  Surgeon: Carole Civil, MD;  Location: [facility];  Service: Orthopedics;  Laterality: Right;  . TONSILLECTOMY    . VENTRAL HERNIA REPAIR N/A 01/26/2018   Procedure: HERNIA REPAIR VENTRAL ADULT WITH MESH;  Surgeon: Virl Cagey, MD;  Location: [facility];  Service: General;  Laterality: N/A;     Current Meds  Medication Sig  . acetaminophen (TYLENOL) 650 MG CR tablet Take 650-1,300 mg by mouth every 8 (eight) hours as needed for pain.  [name] aspirin 325 MG tablet Take 325 mg by mouth every evening.   . Calcium Carb-Cholecalciferol (CALCIUM 600+D3 PO) Take 1 tablet by mouth 2 (two) times daily.  . Cholecalciferol (D3-1000) 1000 units capsule Take 1,000 Units by mouth daily.  . clotrimazole (LOTRIMIN AF) 1 % cream Apply 1 application topically 2 (two) times daily. (Patient taking differently: Apply 1 application topically 2 (two) times daily. Applied to toes twice daily for fungus)  . diclofenac (CATAFLAM) 50 MG tablet Take 1 tablet (50 mg total) by mouth 2 (two) times daily.  [name] docusate sodium (COLACE) 100 MG capsule Take 1 capsule (100 mg total) by mouth 2 (two) times daily.  . flecainide (TAMBOCOR) 100 MG  tablet TAKE ONE-HALF TABLET BY MOUTH TWO TIMES DAILY  . furosemide (LASIX) 20 MG tablet Take 1 tablet (20 mg total) by mouth daily as needed.  . metoprolol tartrate (LOPRESSOR) 25 MG tablet TAKE ONE TABLET BY MOUTH TWICE A DAY (Patient taking differently: Take 25 mg by mouth 2 (two) times daily. )  . trolamine salicylate (ASPERCREME) 10 % cream Apply 1 application topically 2 (two) times daily as  needed for muscle pain.      Allergies:   Tetanus toxoids   Social History   Tobacco Use  . Smoking status: Never Smoker  . Smokeless tobacco: Never Used  Substance Use Topics  . Alcohol use: Yes    Alcohol/week: 2.0 standard drinks    Types: 2 Glasses of wine per week  . Drug use: No     Family Hx: The patient's family history includes Arthritis in her brother; Atrial fibrillation in her son; Diabetes in her paternal grandfather; Stroke in her maternal grandmother and mother.  ROS:   Please see the history of present illness.     All other systems reviewed and are negative.   Prior CV studies:   The following studies were reviewed today:  NA  Labs/Other Tests and Data Reviewed:    EKG:  An ECG dated 11/18/18 was personally reviewed today and demonstrated:  sinus rhythm  Recent Labs: 12/30/2018: ALT 12; BUN 23; Creat 0.75; Hemoglobin 13.9; Platelets 222; Potassium 4.1; Sodium 141   Recent Lipid Panel Lab Results  Component Value Date/Time   CHOL 206 (H) 12/30/2018 12:16 PM   TRIG 226 (H) 12/30/2018 12:16 PM   HDL 43 (L) 12/30/2018 12:16 PM   CHOLHDL 4.8 12/30/2018 12:16 PM   LDLCALC 127 (H) 12/30/2018 12:16 PM    Wt Readings from Last 3 Encounters:  05/07/19 247 lb (112 kg)  05/05/19 247 lb (112 kg)  05/04/19 247 lb (112 kg)     Objective:    Vital Signs:  BP 138/70   Pulse 90   Ht 5\' 8"  (1.727 m)   Wt 247 lb (112 kg)   BMI 37.56 kg/m    VITAL SIGNS:  reviewed  ASSESSMENT & PLAN:    1.  Paroxysmal atrial fibrillation: She is entirely asymptomatic.  When I evaluated her once before in January 2019, I discontinued aspirin and started Eliquis.  However, she never took Eliquis.  She is on metoprolol and flecainide.  We talked about potentially discontinuing flecainide altogether.  I will check a flecainide level.  She wants to remain on aspirin.      COVID-19 Education: The signs and symptoms of COVID-19 were discussed with the patient and how to  seek care for testing (follow up with PCP or arrange E-visit).  The importance of social distancing was discussed today.  Time:   Today, I have spent 25 minutes with the patient with telehealth technology discussing the above problems.     Medication Adjustments/Labs and Tests Ordered: Current medicines are reviewed at length with the patient today.  Concerns regarding medicines are outlined above.   Tests Ordered: No orders of the defined types were placed in this encounter.   Medication Changes: No orders of the defined types were placed in this encounter.   Follow Up:  Virtual Visit  in 1 year(s)  Signed, Kate Sable, MD  05/07/2019 8:35 AM    [place]

## 2019-05-10 ENCOUNTER — Other Ambulatory Visit: Payer: Self-pay | Admitting: Cardiovascular Disease

## 2019-05-10 DIAGNOSIS — I480 Paroxysmal atrial fibrillation: Secondary | ICD-10-CM | POA: Diagnosis not present

## 2019-05-10 DIAGNOSIS — Z5181 Encounter for therapeutic drug level monitoring: Secondary | ICD-10-CM | POA: Diagnosis not present

## 2019-05-10 DIAGNOSIS — Z79899 Other long term (current) drug therapy: Secondary | ICD-10-CM | POA: Diagnosis not present

## 2019-05-13 ENCOUNTER — Ambulatory Visit: Payer: Self-pay | Admitting: Podiatry

## 2019-05-14 LAB — FLECAINIDE LEVEL: Flecainide: 0.22 ug/mL (ref 0.20–0.99)

## 2019-05-17 ENCOUNTER — Telehealth: Payer: Self-pay

## 2019-05-17 NOTE — Telephone Encounter (Signed)
-----   Message from Herminio Commons, MD sent at 05/17/2019 11:55 AM EST ----- Regarding: RE: stay on flecainide She can discontinue flecainide and stay on the same dose of metoprolol she is already taking, 25 mg twice daily. ----- Message ----- From: Bernita Raisin, RN Sent: 05/17/2019  11:31 AM EST To: Herminio Commons, MD Subject: stay on flecainide                             Pt asks if you want her to stay on flecainide and if not,what dose of metoprolol?

## 2019-05-17 NOTE — Telephone Encounter (Signed)
Pt.notified

## 2019-05-20 ENCOUNTER — Ambulatory Visit: Payer: Self-pay | Admitting: Podiatry

## 2019-06-02 ENCOUNTER — Encounter (HOSPITAL_COMMUNITY): Payer: Self-pay | Admitting: Physical Therapy

## 2019-06-02 NOTE — Therapy (Signed)
[place], [place], [address] Phone: [phone]   Fax:  [phone]  Patient Details  Name: Amanda Mckay MRN: [id number] Date of Birth: 01/24/1946 Referring Provider:  No ref. provider found  Encounter Date: 06/02/2019  PHYSICAL THERAPY DISCHARGE SUMMARY  Visits from Start of Care: 16  Current functional level related to goals / functional outcomes: Unable to reassess as patient did not return for follow up.  Last progress note dated 04/16/19   Remaining deficits: Unable to reassess as patient did not return for follow up.    Education / Equipment: Called patient after 4 week absence from therapy. Patient says she is doing well and is pleased with current function. Says she has since been released by MD and will follow up with therapy services as needed.  Plan: Patient agrees to discharge.  Patient goals were partially met. Patient is being discharged due to being pleased with the current functional level.  ?????        4:01 PM, 06/02/19 Josue Hector PT DPT  Physical Therapist with [facility]  [phone] [address] [place], [place], [address] Phone: [phone]   Fax:  [phone]

## 2019-06-10 ENCOUNTER — Ambulatory Visit: Payer: Medicare PPO | Admitting: Podiatry

## 2019-06-10 ENCOUNTER — Encounter: Payer: Self-pay | Admitting: Podiatry

## 2019-06-10 ENCOUNTER — Other Ambulatory Visit: Payer: Self-pay

## 2019-06-10 VITALS — Temp 97.2°F

## 2019-06-10 DIAGNOSIS — L608 Other nail disorders: Secondary | ICD-10-CM | POA: Insufficient documentation

## 2019-06-10 DIAGNOSIS — M79674 Pain in right toe(s): Secondary | ICD-10-CM | POA: Diagnosis not present

## 2019-06-10 DIAGNOSIS — B351 Tinea unguium: Secondary | ICD-10-CM

## 2019-06-10 DIAGNOSIS — M79675 Pain in left toe(s): Secondary | ICD-10-CM | POA: Diagnosis not present

## 2019-06-10 NOTE — Progress Notes (Signed)
This patient returns to the office for evaluation and treatment of long thick painful nails .  This patient is unable to trim her own nails since the patient cannot reach the feet.  Patient says the nails are painful walking and wearing his shoes.  He returns for preventive foot care services.  General Appearance  Alert, conversant and in no acute stress.  Vascular  Dorsalis pedis and posterior tibial  pulses are palpable  bilaterally.  Capillary return is within normal limits  bilaterally. Temperature is within normal limits  bilaterally.  Neurologic  Senn-Weinstein monofilament wire test within normal limits  bilaterally. Muscle power within normal limits bilaterally.  Nails Thick disfigured discolored nails with subungual debris  from hallux to fifth toes bilaterally. No evidence of bacterial infection or drainage bilaterally.  Orthopedic  No limitations of motion  feet .  No crepitus or effusions noted.  No bony pathology or digital deformities noted.  Skin  normotropic skin with no porokeratosis noted bilaterally.  No signs of infections or ulcers noted.     Onychomycosis  Pain in toes right foot  Pain in toes left foot  Debridement  of nails  1-5  B/L with a nail nipper.  Nails were then filed using a dremel tool with no incidents.    RTC  10 weeks   Gardiner Barefoot DPM

## 2019-06-25 ENCOUNTER — Other Ambulatory Visit: Payer: Self-pay | Admitting: Family Medicine

## 2019-08-17 ENCOUNTER — Other Ambulatory Visit: Payer: Self-pay | Admitting: Cardiovascular Disease

## 2019-08-19 ENCOUNTER — Ambulatory Visit: Payer: Medicare PPO | Admitting: Podiatry

## 2019-08-23 ENCOUNTER — Other Ambulatory Visit: Payer: Self-pay

## 2019-08-23 ENCOUNTER — Encounter: Payer: Self-pay | Admitting: Podiatry

## 2019-08-23 ENCOUNTER — Ambulatory Visit: Payer: Medicare PPO | Admitting: Podiatry

## 2019-08-23 VITALS — Temp 98.0°F

## 2019-08-23 DIAGNOSIS — M79674 Pain in right toe(s): Secondary | ICD-10-CM

## 2019-08-23 DIAGNOSIS — B351 Tinea unguium: Secondary | ICD-10-CM

## 2019-08-23 DIAGNOSIS — M79675 Pain in left toe(s): Secondary | ICD-10-CM

## 2019-08-23 DIAGNOSIS — L608 Other nail disorders: Secondary | ICD-10-CM

## 2019-08-23 NOTE — Progress Notes (Signed)
This patient returns to the office for evaluation and treatment of long thick painful nails .  This patient is unable to trim her own nails since the patient cannot reach her feet.  Patient says the nails are painful walking and wearing his shoes.  He returns for preventive foot care services.  General Appearance  Alert, conversant and in no acute stress.  Vascular  Dorsalis pedis and posterior tibial  pulses are palpable  bilaterally.  Capillary return is within normal limits  bilaterally. Temperature is within normal limits  bilaterally.  Neurologic  Senn-Weinstein monofilament wire test within normal limits  bilaterally. Muscle power within normal limits bilaterally.  Nails Thick disfigured discolored nails with subungual debris  from hallux to fifth toes bilaterally. No evidence of bacterial infection or drainage bilaterally.  Orthopedic  No limitations of motion  feet .  No crepitus or effusions noted.  No bony pathology or digital deformities noted.  Skin  normotropic skin with no porokeratosis noted bilaterally.  No signs of infections or ulcers noted.     Onychomycosis  Pain in toes right foot  Pain in toes left foot  Debridement  of nails  1-5  B/L with a nail nipper.  Nails were then filed using a dremel tool with no incidents.    RTC 10 weeks    Gardiner Barefoot DPM

## 2019-08-26 DIAGNOSIS — H25013 Cortical age-related cataract, bilateral: Secondary | ICD-10-CM | POA: Diagnosis not present

## 2019-08-26 DIAGNOSIS — H18413 Arcus senilis, bilateral: Secondary | ICD-10-CM | POA: Diagnosis not present

## 2019-08-26 DIAGNOSIS — H25043 Posterior subcapsular polar age-related cataract, bilateral: Secondary | ICD-10-CM | POA: Diagnosis not present

## 2019-08-26 DIAGNOSIS — H2513 Age-related nuclear cataract, bilateral: Secondary | ICD-10-CM | POA: Diagnosis not present

## 2019-08-26 DIAGNOSIS — H2511 Age-related nuclear cataract, right eye: Secondary | ICD-10-CM | POA: Diagnosis not present

## 2019-09-01 ENCOUNTER — Encounter: Payer: Self-pay | Admitting: Family Medicine

## 2019-09-01 ENCOUNTER — Other Ambulatory Visit: Payer: Self-pay

## 2019-09-01 ENCOUNTER — Ambulatory Visit (INDEPENDENT_AMBULATORY_CARE_PROVIDER_SITE_OTHER): Payer: Medicare PPO | Admitting: Family Medicine

## 2019-09-01 VITALS — BP 138/82 | HR 58 | Temp 97.9°F | Resp 16 | Ht 62.0 in | Wt 236.0 lb

## 2019-09-01 DIAGNOSIS — E669 Obesity, unspecified: Secondary | ICD-10-CM

## 2019-09-01 DIAGNOSIS — I5032 Chronic diastolic (congestive) heart failure: Secondary | ICD-10-CM | POA: Diagnosis not present

## 2019-09-01 DIAGNOSIS — E782 Mixed hyperlipidemia: Secondary | ICD-10-CM | POA: Diagnosis not present

## 2019-09-01 DIAGNOSIS — I482 Chronic atrial fibrillation, unspecified: Secondary | ICD-10-CM

## 2019-09-01 DIAGNOSIS — I10 Essential (primary) hypertension: Secondary | ICD-10-CM

## 2019-09-01 DIAGNOSIS — E66813 Obesity, class 3: Secondary | ICD-10-CM | POA: Insufficient documentation

## 2019-09-01 NOTE — Assessment & Plan Note (Signed)
Sinus rhythem, no recent exacerbations On ASA no abnormal bleeding BP controlled

## 2019-09-01 NOTE — Patient Instructions (Addendum)
Take tylenol first , 1000mg  twice a day  Try to limit the diclofenac ( arthritis pill) F/U 4 months Medicare Physical     Heart Failure, Diagnosis  Heart failure means that your heart is not able to pump blood in the right way. This makes it hard for your body to work well. Heart failure is usually a long-term (chronic) condition. You must take good care of yourself and follow your treatment plan from your doctor. What are the causes? This condition may be caused by:  High blood pressure.  Build up of cholesterol and fat in the arteries.  Heart attack. This injures the heart muscle.  Heart valves that do not open and close properly.  Damage of the heart muscle. This is also called cardiomyopathy.  Lung disease.  Abnormal heart rhythms. What increases the risk? The risk of heart failure goes up as a person ages. This condition is also more likely to develop in people who:  Are overweight.  Are female.  Smoke or chew tobacco.  Abuse alcohol or illegal drugs.  Have taken medicines that can damage the heart.  Have diabetes.  Have abnormal heart rhythms.  Have thyroid problems.  Have low blood counts (anemia). What are the signs or symptoms? Symptoms of this condition include:  Shortness of breath.  Coughing.  Swelling of the feet, ankles, legs, or belly.  Losing weight for no reason.  Trouble breathing.  Waking from sleep because of the need to sit up and get more air.  Rapid heartbeat.  Being very tired.  Feeling dizzy, or feeling like you may pass out (faint).  Having no desire to eat.  Feeling like you may vomit (nauseous).  Peeing (urinating) more at night.  Feeling confused. How is this treated?     This condition may be treated with:  Medicines. These can be given to treat blood pressure and to make the heart muscles stronger.  Changes in your daily life. These may include eating a healthy diet, staying at a healthy body weight,  quitting tobacco and illegal drug use, or doing exercises.  Surgery. Surgery can be done to open blocked valves, or to put devices in the heart, such as pacemakers.  A donor heart (heart transplant). You will receive a healthy heart from a donor. Follow these instructions at home:  Treat other conditions as told by your doctor. These may include high blood pressure, diabetes, thyroid disease, or abnormal heart rhythms.  Learn as much as you can about heart failure.  Get support as you need it.  Keep all follow-up visits as told by your doctor. This is important. Summary  Heart failure means that your heart is not able to pump blood in the right way.  This condition is caused by high blood pressure, heart attack, or damage of the heart muscle.  Symptoms of this condition include shortness of breath and swelling of the feet, ankles, legs, or belly. You may also feel very tired or feel like you may vomit.  You may be treated with medicines, surgery, or changes in your daily life.  Treat other health conditions as told by your doctor. This information is not intended to replace advice given to you by your health care provider. Make sure you discuss any questions you have with your health care provider. Document Revised: 05/22/2018 Document Reviewed: 05/22/2018 Elsevier Patient Education  [place].

## 2019-09-01 NOTE — Assessment & Plan Note (Addendum)
Takes lasix prn despite chronic edema No respiratory symptoms Handout given on heart failure  Recommend reducing NSAID use, Take acetominophen in stead or use topical

## 2019-09-01 NOTE — Progress Notes (Signed)
   Subjective:    Patient ID: Amanda Mckay, female    DOB: Aug 10, 1945, 74 y.o.   MRN: [id number]  Patient presents for Follow-up (is not fasting) PAF- currently controlled, she wa taken off flecainide a few months ago .  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 -  She is taking diclofenac 78m twice a day , occ tylenol  CHF- She is on lasix as needed, weight own 11lbs since Feb    Hereditary Hemochromatosis last check on her ferritin level and iron were normal  Hyperlipidemia noted on labs in Oct,recommended have a statin but she want to work on dietary changes, she has not had labs done          TG were  226 , LDL 127     GERD- she has cut down on heavy dinners at night so she doesn't PEPCID or other antacid       Constipation- taking docusate once a  day            Due for repeat fasting labs   Review Of Systems:  GEN- denies fatigue, fever, weight loss,weakness, recent illness HEENT- denies eye drainage, change in vision, nasal discharge, CVS- denies chest pain, palpitations RESP- denies SOB, cough, wheeze ABD- denies N/V, change in stools, abd pain GU- denies dysuria, hematuria, dribbling, incontinence MSK- + joint pain, muscle aches, injury Neuro- denies headache, dizziness, syncope, seizure activity       Objective:    BP 138/82   Pulse (!) 58   Temp 97.9 F (36.6 C) (Temporal)   Resp 16   Ht 5\' 2"  (1.575 m)   Wt 236 lb (107 kg)   SpO2 96%   BMI 43.16 kg/m  GEN- NAD, alert and oriented x3,uses rolling walker  HEENT- PERRL, EOMI, non injected sclera, pink conjunctiva, MMM, oropharynx clear Neck- Supple, no thyromegaly, no JVD CVS- RRR, no murmur RESP-CTAB ABD-NABS,soft,NT,ND EXT- 1+ bilat  edema Pulses- Radial, DP-palpated        Assessment & Plan:      Problem List Items Addressed This Visit      Unprioritized   Atrial  fibrillation, chronic - Primary    Sinus rhythem, no recent exacerbations On ASA no abnormal bleeding BP controlled       Relevant Orders   CBC with Differential/Platelet   Comprehensive metabolic panel   Chronic diastolic HF (heart failure) (HCC)    Takes lasix prn despite chronic edema No respiratory symptoms Handout given on heart failure  Recommend reducing NSAID use, Take acetominophen in stead or use topical      Class 3 obesity   Essential hypertension   Hereditary hemochromatosis (HCC)    No current symptoms      Relevant Orders   Iron, TIBC and Ferritin Panel   Mixed hyperlipidemia    Discussed diet and affect on lipids, BP and her fluid retention      Relevant Orders   Lipid panel      Note: This dictation was prepared with Dragon dictation along with smaller phrase technology. Any transcriptional errors that result from this process are unintentional.

## 2019-09-01 NOTE — Assessment & Plan Note (Signed)
Discussed diet and affect on lipids, BP and her fluid retention

## 2019-09-01 NOTE — Assessment & Plan Note (Signed)
No current symptoms

## 2019-09-02 LAB — LIPID PANEL
Cholesterol: 236 mg/dL — ABNORMAL HIGH (ref <200)
HDL: 47 mg/dL — ABNORMAL LOW (ref >=50)
LDL Cholesterol (Calc): 156 mg/dL (calc) — ABNORMAL HIGH
Non-HDL Cholesterol (Calc): 189 mg/dL (calc) — ABNORMAL HIGH (ref <130)
Total CHOL/HDL Ratio: 5 (calc) — ABNORMAL HIGH (ref <5.0)
Triglycerides: 192 mg/dL — ABNORMAL HIGH (ref <150)

## 2019-09-02 LAB — COMPREHENSIVE METABOLIC PANEL
AG Ratio: 1.6 (calc) (ref 1.0–2.5)
ALT: 14 U/L (ref 6–29)
AST: 14 U/L (ref 10–35)
Albumin: 4 g/dL (ref 3.6–5.1)
Alkaline phosphatase (APISO): 68 U/L (ref 37–153)
BUN/Creatinine Ratio: 37 (calc) — ABNORMAL HIGH (ref 6–22)
BUN: 28 mg/dL — ABNORMAL HIGH (ref 7–25)
CO2: 28 mmol/L (ref 20–32)
Calcium: 10.1 mg/dL (ref 8.6–10.4)
Chloride: 106 mmol/L (ref 98–110)
Creat: 0.75 mg/dL (ref 0.60–0.93)
Globulin: 2.5 g/dL (calc) (ref 1.9–3.7)
Glucose, Bld: 82 mg/dL (ref 65–99)
Potassium: 4.1 mmol/L (ref 3.5–5.3)
Sodium: 142 mmol/L (ref 135–146)
Total Bilirubin: 0.6 mg/dL (ref 0.2–1.2)
Total Protein: 6.5 g/dL (ref 6.1–8.1)

## 2019-09-02 LAB — CBC WITH DIFFERENTIAL/PLATELET
Absolute Monocytes: 537 cells/uL (ref 200–950)
Basophils Absolute: 49 cells/uL (ref 0–200)
Basophils Relative: 0.8 %
Eosinophils Absolute: 262 cells/uL (ref 15–500)
Eosinophils Relative: 4.3 %
HCT: 42.8 % (ref 35.0–45.0)
Hemoglobin: 14.3 g/dL (ref 11.7–15.5)
Lymphs Abs: 1647 cells/uL (ref 850–3900)
MCH: 31.6 pg (ref 27.0–33.0)
MCHC: 33.4 g/dL (ref 32.0–36.0)
MCV: 94.7 fL (ref 80.0–100.0)
MPV: 10 fL (ref 7.5–12.5)
Monocytes Relative: 8.8 %
Neutro Abs: 3605 cells/uL (ref 1500–7800)
Neutrophils Relative %: 59.1 %
Platelets: 207 10*3/uL (ref 140–400)
RBC: 4.52 10*6/uL (ref 3.80–5.10)
RDW: 12.1 % (ref 11.0–15.0)
Total Lymphocyte: 27 %
WBC: 6.1 10*3/uL (ref 3.8–10.8)

## 2019-09-02 LAB — IRON,TIBC AND FERRITIN PANEL
%SAT: 52 % (calc) — ABNORMAL HIGH (ref 16–45)
Ferritin: 196 ng/mL (ref 16–288)
Iron: 138 ug/dL (ref 45–160)
TIBC: 267 mcg/dL (calc) (ref 250–450)

## 2019-09-09 ENCOUNTER — Other Ambulatory Visit: Payer: Self-pay | Admitting: *Deleted

## 2019-09-09 MED ORDER — ATORVASTATIN CALCIUM 10 MG PO TABS: 10.0000 mg | ORAL_TABLET | ORAL | 3 refills | Status: DC

## 2019-11-01 ENCOUNTER — Ambulatory Visit: Payer: Medicare PPO | Admitting: Podiatry

## 2019-11-01 ENCOUNTER — Encounter: Payer: Self-pay | Admitting: Podiatry

## 2019-11-01 ENCOUNTER — Other Ambulatory Visit: Payer: Self-pay

## 2019-11-01 DIAGNOSIS — M79674 Pain in right toe(s): Secondary | ICD-10-CM | POA: Diagnosis not present

## 2019-11-01 DIAGNOSIS — B351 Tinea unguium: Secondary | ICD-10-CM

## 2019-11-01 DIAGNOSIS — M79675 Pain in left toe(s): Secondary | ICD-10-CM | POA: Diagnosis not present

## 2019-11-01 DIAGNOSIS — L608 Other nail disorders: Secondary | ICD-10-CM

## 2019-11-01 NOTE — Progress Notes (Signed)
This patient returns to the office for evaluation and treatment of long thick painful nails .  This patient is unable to trim her own nails since the patient cannot reach her feet.  Patient says the nails are painful walking and wearing her shoes.  Sh e returns for preventive foot care services.  General Appearance  Alert, conversant and in no acute stress.  Vascular  Dorsalis pedis and posterior tibial  pulses are palpable  bilaterally.  Capillary return is within normal limits  bilaterally. Temperature is within normal limits  bilaterally.  Neurologic  Senn-Weinstein monofilament wire test within normal limits  bilaterally. Muscle power within normal limits bilaterally.  Nails Thick disfigured discolored nails with subungual debris  from hallux to fifth toes bilaterally. No evidence of bacterial infection or drainage bilaterally. Pincer hallux nails  B/L.  Orthopedic  No limitations of motion  feet .  No crepitus or effusions noted.  No bony pathology or digital deformities noted.  Skin  normotropic skin with no porokeratosis noted bilaterally.  No signs of infections or ulcers noted.     Onychomycosis  Pain in toes right foot  Pain in toes left foot  Debridement  of nails  1-5  B/L with a nail nipper.  Nails were then filed using a dremel tool with no incidents.    RTC 10 weeks    Joshiah Traynham DPM  

## 2019-11-08 ENCOUNTER — Telehealth: Payer: Self-pay | Admitting: Orthopedic Surgery

## 2019-11-08 NOTE — Telephone Encounter (Signed)
Patient / spouse inquiring about appointment for recurring problem low back pain for which Dr Aline Brochure has treated. Aware due to schedule changes, we are working on scheduling for as soon as possible. (spouse also offered for her to come in his slot on Friday, however, his appointment is new problem slot) Appt pending.

## 2019-11-15 ENCOUNTER — Other Ambulatory Visit: Payer: Self-pay

## 2019-11-15 ENCOUNTER — Encounter: Payer: Self-pay | Admitting: Orthopedic Surgery

## 2019-11-15 ENCOUNTER — Ambulatory Visit: Payer: Medicare PPO | Admitting: Orthopedic Surgery

## 2019-11-15 VITALS — BP 129/86 | HR 110 | Ht 62.0 in | Wt 242.0 lb

## 2019-11-15 DIAGNOSIS — M47816 Spondylosis without myelopathy or radiculopathy, lumbar region: Secondary | ICD-10-CM | POA: Diagnosis not present

## 2019-11-15 DIAGNOSIS — M4316 Spondylolisthesis, lumbar region: Secondary | ICD-10-CM

## 2019-11-15 NOTE — Patient Instructions (Signed)
MRI scan has been ordered for you, we will get approval for the scan if needed, then Forestine Na will call you with appointment. After you get the appointment for the MRI, please call us to schedule appointment for a follow up.   You can speed up the process by calling Forestine Na to schedule the number is [phone]

## 2019-11-15 NOTE — Progress Notes (Signed)
Chief Complaint  Patient presents with  . Back Pain    trouble walking / can't sleep, has to sleep in recliner     S/p rt knee arthroscopy 01/2019  74 year old female who had knee arthroscopy in November 2020 developed some lumbar pain back in December 2020 found to have spondylolisthesis lumbar spine  Presents with back pain for 1-1/2 weeks which has eased off a little bit in the last few days.  Patient says it hurts when she is getting up from a seated position standing up also causes pain she has had to sleep in a recliner and gone back to using her walker  Pain is isolated to the lumbar spine without leg pain numbness or tingling  Past Medical History:  Diagnosis Date  . Arthritis   . Atrial fibrillation ([place])   . Cataract   . Dysrhythmia    AFib  . GERD (gastroesophageal reflux disease)   . Hemochromatosis   . Iron excess    Review of Systems  Gastrointestinal: Negative.   Genitourinary: Negative.    BP 129/86   Pulse (!) 110   Ht 5\' 2"  (1.575 m)   Wt 242 lb (109.8 kg)   BMI 44.26 kg/m   The patient meets the AMA guidelines for Morbid (severe) obesity with a BMI > 40.0 and I have recommended weight loss.  She is awake alert and oriented x3 mood affect normal she is sitting on her walker with a chair  She has bilateral leg edema color capillary refill normal she has tenderness in the central portion and left side lumbar spine with negative leg raises strength is normal  Bilateral small flexion contractures of the knee no pain with range of motion of either hip no weakness in either leg  Lumbar spine x-ray 12 /2020 X-rays lumbar spine  Patient having pain down the right leg after arthroscopic surgery  X-ray shows scoliosis of the lumbar spine with degenerative facet arthritis and mild anterolisthesis grade 1 L4 on 5  Impression degenerative scoliosis facet arthrosis mild to moderate with grade 1 anterolisthesis L4 on 5  Encounter Diagnoses  Name Primary?   . Lumbar spondylosis   . Spondylolisthesis, lumbar region Yes    MRI L SPINE

## 2019-11-15 NOTE — Telephone Encounter (Signed)
Done

## 2019-11-26 ENCOUNTER — Ambulatory Visit (HOSPITAL_COMMUNITY)
Admission: RE | Admit: 2019-11-26 | Discharge: 2019-11-26 | Disposition: A | Discharge status: Home / Self Care | Payer: Medicare PPO | Source: Ambulatory Visit | Attending: Orthopedic Surgery | Admitting: Orthopedic Surgery

## 2019-11-26 ENCOUNTER — Other Ambulatory Visit: Payer: Self-pay

## 2019-11-26 DIAGNOSIS — M419 Scoliosis, unspecified: Secondary | ICD-10-CM | POA: Diagnosis not present

## 2019-11-26 DIAGNOSIS — M4316 Spondylolisthesis, lumbar region: Secondary | ICD-10-CM | POA: Insufficient documentation

## 2019-11-26 DIAGNOSIS — M47816 Spondylosis without myelopathy or radiculopathy, lumbar region: Secondary | ICD-10-CM

## 2019-11-26 DIAGNOSIS — M4856XA Collapsed vertebra, not elsewhere classified, lumbar region, initial encounter for fracture: Secondary | ICD-10-CM | POA: Diagnosis not present

## 2019-12-02 ENCOUNTER — Telehealth: Payer: Self-pay | Admitting: Orthopedic Surgery

## 2019-12-02 NOTE — Telephone Encounter (Signed)
Amanda Mckay with [facility] called back and left a message for you to call her back at [phone] and press [phone] for the operator to page [place]. Please advise.

## 2019-12-03 NOTE — Telephone Encounter (Signed)
She wanted to let me know scheduled for 12/06/19

## 2019-12-06 ENCOUNTER — Other Ambulatory Visit: Payer: Self-pay

## 2019-12-06 ENCOUNTER — Ambulatory Visit (INDEPENDENT_AMBULATORY_CARE_PROVIDER_SITE_OTHER): Payer: Medicare PPO

## 2019-12-06 DIAGNOSIS — Z23 Encounter for immunization: Secondary | ICD-10-CM

## 2019-12-06 DIAGNOSIS — M4316 Spondylolisthesis, lumbar region: Secondary | ICD-10-CM | POA: Diagnosis not present

## 2019-12-06 DIAGNOSIS — R030 Elevated blood-pressure reading, without diagnosis of hypertension: Secondary | ICD-10-CM | POA: Diagnosis not present

## 2019-12-06 DIAGNOSIS — S32010A Wedge compression fracture of first lumbar vertebra, initial encounter for closed fracture: Secondary | ICD-10-CM | POA: Diagnosis not present

## 2019-12-06 DIAGNOSIS — Z6841 Body Mass Index (BMI) 40.0 and over, adult: Secondary | ICD-10-CM | POA: Diagnosis not present

## 2019-12-08 ENCOUNTER — Other Ambulatory Visit: Payer: Self-pay | Admitting: Neurosurgery

## 2019-12-08 DIAGNOSIS — S32010A Wedge compression fracture of first lumbar vertebra, initial encounter for closed fracture: Secondary | ICD-10-CM

## 2019-12-15 ENCOUNTER — Other Ambulatory Visit: Payer: Self-pay

## 2019-12-15 ENCOUNTER — Encounter: Payer: Self-pay | Admitting: Family Medicine

## 2019-12-15 ENCOUNTER — Ambulatory Visit: Payer: Medicare PPO | Admitting: Family Medicine

## 2019-12-15 ENCOUNTER — Ambulatory Visit (INDEPENDENT_AMBULATORY_CARE_PROVIDER_SITE_OTHER): Payer: Medicare PPO | Admitting: Family Medicine

## 2019-12-15 VITALS — BP 130/68 | HR 88 | Temp 98.2°F | Resp 16 | Ht 62.0 in | Wt 248.0 lb

## 2019-12-15 DIAGNOSIS — R609 Edema, unspecified: Secondary | ICD-10-CM

## 2019-12-15 DIAGNOSIS — L03116 Cellulitis of left lower limb: Secondary | ICD-10-CM | POA: Diagnosis not present

## 2019-12-15 DIAGNOSIS — S32010D Wedge compression fracture of first lumbar vertebra, subsequent encounter for fracture with routine healing: Secondary | ICD-10-CM | POA: Diagnosis not present

## 2019-12-15 MED ORDER — CEPHALEXIN 500 MG PO CAPS: 500.0000 mg | ORAL_CAPSULE | Freq: Two times a day (BID) | ORAL | 0 refills | Status: DC

## 2019-12-15 MED ORDER — POTASSIUM CHLORIDE ER 10 MEQ PO TBCR: EXTENDED_RELEASE_TABLET | ORAL | 2 refills | Status: DC

## 2019-12-15 NOTE — Progress Notes (Signed)
   Subjective:    Patient ID: Amanda Mckay, female    DOB: May 11, 1945, 74 y.o.   MRN: [id number]  Patient presents for Cellulitis (L LE)   Pt here with concern for recurrent cellulitis  2 days ago noticed redness and a little warmth to her left leg She had cellulitis that presented the same way last year No fever or chills Legs swelling more, she has not been wearing compression hose  No new joint pain, has chronic back/knee pain   ALso following with spine surgeon has compression fracture and needs bone density but she was scheduled for [facility] for Jan, wanted closer to home  Review Of Systems:  GEN- denies fatigue, fever, weight loss,weakness, recent illness HEENT- denies eye drainage, change in vision, nasal discharge, CVS- denies chest pain, palpitations RESP- denies SOB, cough, wheeze ABD- denies N/V, change in stools, abd pain GU- denies dysuria, hematuria, dribbling, incontinence MSK- +oint pain, muscle aches, injury Neuro- denies headache, dizziness, syncope, seizure activity       Objective:    BP 130/68   Pulse 88   Temp 98.2 F (36.8 C) (Temporal)   Resp 16   Ht 5\' 2"  (1.575 m)   Wt 248 lb (112.5 kg)   SpO2 98%   BMI 45.36 kg/m  GEN- NAD, alert and oriented x3, sitting in chair  HEENT- PERRL, EOMI, non injected sclera, pink conjunctiva, MMM, oropharynx clear CVS- RRR, no murmur RESP-CTAB EXT- + non pitting edema bilat ext vensous stasis changes bil, left LE erythema to mid shin, much greater than Right  ,  Callus on feet, no open lesions         Assessment & Plan:      Problem List Items Addressed This Visit      Unprioritized   Peripheral edema    Other Visit Diagnoses    Cellulitis of left lower extremity    -  Primary   Early cellulitis changes on top of her venous stasis, advised to start antibiotics, compresion hose, elevate feet, diuretics daily with potassium, she was taking lasix prn   Compression fracture of L1 vertebra with routine  healing, subsequent encounter       will reschedule for [facility], pt to call for appt   Relevant Orders   DG Bone Density      Note: This dictation was prepared with Dragon dictation along with smaller phrase technology. Any transcriptional errors that result from this process are unintentional.

## 2019-12-15 NOTE — Patient Instructions (Signed)
Schedule your Bone Density with Forestine Na Take the antibiotics as prescribed  Take your lasix once a day  Elevate your feet Wear your compression hose F/U as previous

## 2019-12-16 ENCOUNTER — Encounter: Payer: Self-pay | Admitting: Family Medicine

## 2019-12-22 ENCOUNTER — Ambulatory Visit: Payer: Medicare PPO | Admitting: Family Medicine

## 2019-12-31 ENCOUNTER — Other Ambulatory Visit: Payer: Self-pay

## 2019-12-31 ENCOUNTER — Encounter: Payer: Self-pay | Admitting: Family Medicine

## 2019-12-31 ENCOUNTER — Ambulatory Visit (HOSPITAL_COMMUNITY)
Admission: RE | Admit: 2019-12-31 | Discharge: 2019-12-31 | Disposition: A | Discharge status: Home / Self Care | Payer: Medicare PPO | Source: Ambulatory Visit | Attending: Family Medicine | Admitting: Family Medicine

## 2019-12-31 DIAGNOSIS — X58XXXD Exposure to other specified factors, subsequent encounter: Secondary | ICD-10-CM | POA: Insufficient documentation

## 2019-12-31 DIAGNOSIS — S32010D Wedge compression fracture of first lumbar vertebra, subsequent encounter for fracture with routine healing: Secondary | ICD-10-CM

## 2019-12-31 DIAGNOSIS — Z780 Asymptomatic menopausal state: Secondary | ICD-10-CM | POA: Insufficient documentation

## 2019-12-31 DIAGNOSIS — M8580 Other specified disorders of bone density and structure, unspecified site: Secondary | ICD-10-CM | POA: Diagnosis not present

## 2019-12-31 DIAGNOSIS — Z13820 Encounter for screening for osteoporosis: Secondary | ICD-10-CM | POA: Insufficient documentation

## 2019-12-31 DIAGNOSIS — M85852 Other specified disorders of bone density and structure, left thigh: Secondary | ICD-10-CM | POA: Diagnosis not present

## 2019-12-31 DIAGNOSIS — M810 Age-related osteoporosis without current pathological fracture: Secondary | ICD-10-CM | POA: Insufficient documentation

## 2019-12-31 DIAGNOSIS — R29890 Loss of height: Secondary | ICD-10-CM | POA: Diagnosis not present

## 2020-01-03 ENCOUNTER — Other Ambulatory Visit: Payer: Self-pay

## 2020-01-03 ENCOUNTER — Encounter: Payer: Self-pay | Admitting: Family Medicine

## 2020-01-03 ENCOUNTER — Ambulatory Visit (INDEPENDENT_AMBULATORY_CARE_PROVIDER_SITE_OTHER): Payer: Medicare PPO | Admitting: Family Medicine

## 2020-01-03 VITALS — BP 130/88 | HR 100 | Temp 98.4°F | Resp 14 | Ht 62.0 in | Wt 245.0 lb

## 2020-01-03 DIAGNOSIS — E782 Mixed hyperlipidemia: Secondary | ICD-10-CM | POA: Diagnosis not present

## 2020-01-03 DIAGNOSIS — G2581 Restless legs syndrome: Secondary | ICD-10-CM

## 2020-01-03 DIAGNOSIS — Z0000 Encounter for general adult medical examination without abnormal findings: Secondary | ICD-10-CM

## 2020-01-03 DIAGNOSIS — E669 Obesity, unspecified: Secondary | ICD-10-CM | POA: Diagnosis not present

## 2020-01-03 DIAGNOSIS — M8589 Other specified disorders of bone density and structure, multiple sites: Secondary | ICD-10-CM | POA: Diagnosis not present

## 2020-01-03 DIAGNOSIS — I5032 Chronic diastolic (congestive) heart failure: Secondary | ICD-10-CM | POA: Diagnosis not present

## 2020-01-03 DIAGNOSIS — E559 Vitamin D deficiency, unspecified: Secondary | ICD-10-CM | POA: Diagnosis not present

## 2020-01-03 DIAGNOSIS — Z0001 Encounter for general adult medical examination with abnormal findings: Secondary | ICD-10-CM | POA: Diagnosis not present

## 2020-01-03 DIAGNOSIS — I10 Essential (primary) hypertension: Secondary | ICD-10-CM | POA: Diagnosis not present

## 2020-01-03 NOTE — Patient Instructions (Addendum)
Try Magnesium either in spray or roll on/ or tablet 400mg  once a day  Prolia is an option for your Osteopenia treatment Take your lasix daily, wear the compression hose as much as possible F/U 4 months

## 2020-01-03 NOTE — Progress Notes (Signed)
Subjective:   Patient presents for Medicare Annual/Subsequent preventive examination.        Pt here for wellness visit            Meds reviewed        She recently had cellulitis that has resolved        She noticed the potassium is coming out of whole in stool when she takes it           At night her legs jump around at night and spasm she wants to try something natural instead of RLS medication               Bone density showed osteopenia with history of fracture , she wants to discuss with her spine surgeon before taking any meds in setting of her compression fracture, she was on fosmax in the past         Hyperlipidemia - taking lipitor three times a week    Due for recheck      Edema- she has been taking lasix a little more and trying to wear her compression hose              Review Past Medical/Family/Social: per EMR    Risk Factors  Current exercise habits: none Dietary issues discussed:Yes  Cardiac risk factors: Obesity (BMI >= 30 kg/m2). CHF,HTN  Depression Screen  (Note: if answer to either of the following is "Yes", , a more complete depression screening is indicated)  Over the past two weeks, have you felt down, depressed or hopeless? No Over the past two weeks, have you felt little interest or pleasure in doing things? No Have you lost interest or pleasure in daily life? No Do you often feel hopeless? No Do you cry easily over simple problems? No   Activities of Daily Living  In your present state of health, do you have any difficulty performing the following activities?:  Driving? No  Managing money? No  Feeding yourself? No  Getting from bed to chair? No  Climbing a flight of stairs? Yes   Preparing food and eating?: No  Bathing or showering? No  Getting dressed: No  Getting to the toilet? No  Using the toilet:No  Moving around from place to place: Yes   In the past year have you fallen or had a near fall?:No    Hearing Difficulties: No   Do you often ask people to speak up or repeat themselves? No  Do you experience ringing or noises in your ears? No Do you have difficulty understanding soft or whispered voices? No  Do you feel that you have a problem with memory? No Do you often misplace items? No  Do you feel safe at home? Yes  Cognitive Testing  Alert? Yes Normal Appearance?Yes  Oriented to person? Yes Place? Yes  Time? Yes  Recall of three objects? Yes  Can perform simple calculations? Yes  Displays appropriate judgment?Yes  Can read the correct time from a watch face?Yes   List the Names of Other Physician/Practitioners you currently use:  Cardiology Podiatry  Orthopedics  Dermatology   Screening Tests / Date Cologaurd-   UTD                  Mammogram UTD 2019,Declines further  Influenza Vaccine  UTD Tetanus/tdap Declines PNA UTD COVID-19 - Done at [place] [place]- denies fatigue, fever, weight loss,weakness, recent illness HEENT- denies eye drainage, change in vision, nasal discharge, CVS-  denies chest pain, palpitations RESP- denies SOB, cough, wheeze ABD- denies N/V, change in stools, abd pain GU- denies dysuria, hematuria, dribbling, incontinence MSK- denies joint pain, muscle aches, injury Neuro- denies headache, dizziness, syncope, seizure activity  Physical: vitals reviewed  GEN- NAD, alert and oriented x3 HEENT- PERRL, EOMI, non injected sclera, pink conjunctiva, MMM, oropharynx clear, TM clear no effusion  Neck- Supple, no thryomegaly, no JVD  CVS- RRR, no murmur RESP-CTAB ABD-NABS,soft,NT,ND EXT- + non pitting edema bilat LE  Pulses- Radial, DP- palpated   Assessment:    Annual wellness medicare exam   Plan:    During the course of the visit the patient was educated and counseled about appropriate screening and preventive services including:   Depression/audit C/Fall screen negative  PAF- currently controlled, BP controlled, f/u  cardiology   Immunizations- Flu shot given, obtain records from [place] for previous shots    Mammogram every 2 years,  Osteopenia plus compression fracture, recommend treatment, she declines fosamax, consider Prolia, she wants to discuss with spine surgeon first  OA knee- recommend she f/u ortho and have MRI done, can use topical NSAID, tylenol, sever pain ultram  CHF- chronic edema, needs to be more  Consistent with daily lasix and compression hose   RLS- trial of magnesium ady    Hereditary Hemochromatosis check iron studies   Discussed advanced directives handout given  Pt FULL CODE at this time  Medicare Attestation  I have personally reviewed:  The patient's medical and social history  Their use of alcohol, tobacco or illicit drugs  Their current medications and supplements  The patient's functional ability including ADLs,fall risks, home safety risks, cognitive, and hearing and visual impairment  Diet and physical activities  Evidence for depression or mood disorders  The patient's weight, height, BMI, and visual acuity have been recorded in the chart. I have made referrals, counseling, and provided education to the patient based on review of the above and I have provided the patient with a written personalized care plan for preventive services.

## 2020-01-04 DIAGNOSIS — G2581 Restless legs syndrome: Secondary | ICD-10-CM | POA: Insufficient documentation

## 2020-01-04 LAB — CBC WITH DIFFERENTIAL/PLATELET
Absolute Monocytes: 435 cells/uL (ref 200–950)
Basophils Absolute: 40 cells/uL (ref 0–200)
Basophils Relative: 0.8 %
Eosinophils Absolute: 200 cells/uL (ref 15–500)
Eosinophils Relative: 4 %
HCT: 41.4 % (ref 35.0–45.0)
Hemoglobin: 14 g/dL (ref 11.7–15.5)
Lymphs Abs: 1265 cells/uL (ref 850–3900)
MCH: 32.3 pg (ref 27.0–33.0)
MCHC: 33.8 g/dL (ref 32.0–36.0)
MCV: 95.6 fL (ref 80.0–100.0)
MPV: 10.3 fL (ref 7.5–12.5)
Monocytes Relative: 8.7 %
Neutro Abs: 3060 cells/uL (ref 1500–7800)
Neutrophils Relative %: 61.2 %
Platelets: 192 10*3/uL (ref 140–400)
RBC: 4.33 10*6/uL (ref 3.80–5.10)
RDW: 11.8 % (ref 11.0–15.0)
Total Lymphocyte: 25.3 %
WBC: 5 10*3/uL (ref 3.8–10.8)

## 2020-01-04 LAB — VITAMIN D 25 HYDROXY (VIT D DEFICIENCY, FRACTURES): Vit D, 25-Hydroxy: 52 ng/mL (ref 30–100)

## 2020-01-04 LAB — COMPREHENSIVE METABOLIC PANEL
AG Ratio: 1.8 (calc) (ref 1.0–2.5)
ALT: 14 U/L (ref 6–29)
AST: 14 U/L (ref 10–35)
Albumin: 4.2 g/dL (ref 3.6–5.1)
Alkaline phosphatase (APISO): 73 U/L (ref 37–153)
BUN: 24 mg/dL (ref 7–25)
CO2: 28 mmol/L (ref 20–32)
Calcium: 9.7 mg/dL (ref 8.6–10.4)
Chloride: 106 mmol/L (ref 98–110)
Creat: 0.84 mg/dL (ref 0.60–0.93)
Globulin: 2.3 g/dL (calc) (ref 1.9–3.7)
Glucose, Bld: 91 mg/dL (ref 65–99)
Potassium: 4 mmol/L (ref 3.5–5.3)
Sodium: 143 mmol/L (ref 135–146)
Total Bilirubin: 0.6 mg/dL (ref 0.2–1.2)
Total Protein: 6.5 g/dL (ref 6.1–8.1)

## 2020-01-04 LAB — LIPID PANEL
Cholesterol: 159 mg/dL (ref <200)
HDL: 46 mg/dL — ABNORMAL LOW (ref >=50)
LDL Cholesterol (Calc): 87 mg/dL (calc)
Non-HDL Cholesterol (Calc): 113 mg/dL (calc) (ref <130)
Total CHOL/HDL Ratio: 3.5 (calc) (ref <5.0)
Triglycerides: 159 mg/dL — ABNORMAL HIGH (ref <150)

## 2020-01-10 ENCOUNTER — Ambulatory Visit (INDEPENDENT_AMBULATORY_CARE_PROVIDER_SITE_OTHER): Payer: Medicare PPO | Admitting: Podiatry

## 2020-01-10 ENCOUNTER — Encounter: Payer: Self-pay | Admitting: Podiatry

## 2020-01-10 ENCOUNTER — Other Ambulatory Visit: Payer: Self-pay

## 2020-01-10 DIAGNOSIS — B351 Tinea unguium: Secondary | ICD-10-CM | POA: Diagnosis not present

## 2020-01-10 DIAGNOSIS — M79674 Pain in right toe(s): Secondary | ICD-10-CM | POA: Diagnosis not present

## 2020-01-10 DIAGNOSIS — M79675 Pain in left toe(s): Secondary | ICD-10-CM

## 2020-01-10 DIAGNOSIS — L608 Other nail disorders: Secondary | ICD-10-CM

## 2020-01-10 NOTE — Progress Notes (Signed)
This patient returns to the office for evaluation and treatment of long thick painful nails .  This patient is unable to trim her own nails since the patient cannot reach her feet.  Patient says the nails are painful walking and wearing her shoes.  Sh e returns for preventive foot care services.  General Appearance  Alert, conversant and in no acute stress.  Vascular  Dorsalis pedis and posterior tibial  pulses are palpable  bilaterally.  Capillary return is within normal limits  bilaterally. Temperature is within normal limits  bilaterally.  Neurologic  Senn-Weinstein monofilament wire test within normal limits  bilaterally. Muscle power within normal limits bilaterally.  Nails Thick disfigured discolored nails with subungual debris  from hallux to fifth toes bilaterally. No evidence of bacterial infection or drainage bilaterally. Pincer hallux nails  B/L.  Orthopedic  No limitations of motion  feet .  No crepitus or effusions noted.  No bony pathology or digital deformities noted.  Skin  normotropic skin with no porokeratosis noted bilaterally.  No signs of infections or ulcers noted.     Onychomycosis  Pain in toes right foot  Pain in toes left foot  Debridement  of nails  1-5  B/L with a nail nipper.  Nails were then filed using a dremel tool with no incidents.    RTC 10 weeks    Gardiner Barefoot DPM

## 2020-01-13 ENCOUNTER — Other Ambulatory Visit: Payer: Self-pay | Admitting: Orthopedic Surgery

## 2020-01-13 DIAGNOSIS — G8929 Other chronic pain: Secondary | ICD-10-CM

## 2020-01-13 DIAGNOSIS — M25561 Pain in right knee: Secondary | ICD-10-CM

## 2020-01-28 ENCOUNTER — Other Ambulatory Visit: Payer: Self-pay | Admitting: Family Medicine

## 2020-03-20 ENCOUNTER — Ambulatory Visit: Payer: Medicare PPO | Admitting: Podiatry

## 2020-03-24 ENCOUNTER — Other Ambulatory Visit: Payer: Medicare PPO

## 2020-03-27 ENCOUNTER — Encounter: Payer: Self-pay | Admitting: Podiatry

## 2020-03-27 ENCOUNTER — Ambulatory Visit: Payer: Medicare PPO | Admitting: Podiatry

## 2020-03-27 ENCOUNTER — Other Ambulatory Visit: Payer: Self-pay

## 2020-03-27 DIAGNOSIS — L608 Other nail disorders: Secondary | ICD-10-CM

## 2020-03-27 DIAGNOSIS — M79674 Pain in right toe(s): Secondary | ICD-10-CM | POA: Diagnosis not present

## 2020-03-27 DIAGNOSIS — M79675 Pain in left toe(s): Secondary | ICD-10-CM | POA: Diagnosis not present

## 2020-03-27 DIAGNOSIS — B351 Tinea unguium: Secondary | ICD-10-CM | POA: Diagnosis not present

## 2020-03-27 NOTE — Progress Notes (Signed)
This patient returns to the office for evaluation and treatment of long thick painful nails .  This patient is unable to trim her own nails since the patient cannot reach her feet.  Patient says the nails are painful walking and wearing her shoes.  Sh e returns for preventive foot care services.  General Appearance  Alert, conversant and in no acute stress.  Vascular  Dorsalis pedis and posterior tibial  pulses are not  Palpable due to swelling legs/feet.  bilaterally.  Capillary return is within normal limits  Bilaterally.Cold feet  Bilaterally.  Absent digital hair  B/L.  Neurologic  Senn-Weinstein monofilament wire test within normal limits  bilaterally. Muscle power within normal limits bilaterally.  Nails Thick disfigured discolored nails with subungual debris  from hallux to fifth toes bilaterally. No evidence of bacterial infection or drainage bilaterally. Pincer hallux nails  B/L.  Orthopedic  No limitations of motion  feet .  No crepitus or effusions noted.  No bony pathology or digital deformities noted.  Skin  normotropic skin with no porokeratosis noted bilaterally.  No signs of infections or ulcers noted.     Onychomycosis  Pain in toes right foot  Pain in toes left foot  Debridement  of nails  1-5  B/L with a nail nipper.  Nails were then filed using a dremel tool with no incidents.    RTC 10 weeks    Ranee Peasley DPM  

## 2020-05-05 ENCOUNTER — Ambulatory Visit: Payer: Medicare PPO | Admitting: Family Medicine

## 2020-05-05 ENCOUNTER — Other Ambulatory Visit: Payer: Self-pay | Admitting: Student

## 2020-05-05 MED ORDER — METOPROLOL TARTRATE 25 MG PO TABS: 25.0000 mg | ORAL_TABLET | Freq: Two times a day (BID) | ORAL | 0 refills | Status: DC

## 2020-05-12 ENCOUNTER — Other Ambulatory Visit: Payer: Self-pay | Admitting: Orthopedic Surgery

## 2020-05-12 DIAGNOSIS — M25561 Pain in right knee: Secondary | ICD-10-CM

## 2020-05-12 DIAGNOSIS — G8929 Other chronic pain: Secondary | ICD-10-CM

## 2020-05-22 ENCOUNTER — Ambulatory Visit (INDEPENDENT_AMBULATORY_CARE_PROVIDER_SITE_OTHER): Payer: Medicare PPO | Admitting: Family Medicine

## 2020-05-22 ENCOUNTER — Other Ambulatory Visit: Payer: Self-pay

## 2020-05-22 ENCOUNTER — Encounter: Payer: Self-pay | Admitting: Family Medicine

## 2020-05-22 VITALS — BP 134/76 | Temp 98.3°F | Resp 18 | Wt 246.4 lb

## 2020-05-22 DIAGNOSIS — E559 Vitamin D deficiency, unspecified: Secondary | ICD-10-CM | POA: Diagnosis not present

## 2020-05-22 DIAGNOSIS — I482 Chronic atrial fibrillation, unspecified: Secondary | ICD-10-CM

## 2020-05-22 DIAGNOSIS — M8589 Other specified disorders of bone density and structure, multiple sites: Secondary | ICD-10-CM

## 2020-05-22 DIAGNOSIS — R8290 Unspecified abnormal findings in urine: Secondary | ICD-10-CM

## 2020-05-22 DIAGNOSIS — R252 Cramp and spasm: Secondary | ICD-10-CM

## 2020-05-22 DIAGNOSIS — R2241 Localized swelling, mass and lump, right lower limb: Secondary | ICD-10-CM

## 2020-05-22 DIAGNOSIS — I5032 Chronic diastolic (congestive) heart failure: Secondary | ICD-10-CM | POA: Diagnosis not present

## 2020-05-22 NOTE — Assessment & Plan Note (Signed)
Currently compensated Concern for leg swelling likely varicosity but localed, could be superficial thrombus as well -obtain US to better evaluate

## 2020-05-22 NOTE — Assessment & Plan Note (Addendum)
Recommend prolia, shei s on calcium and Vitamin D Will look into pricing  She declines bisphosphonate again

## 2020-05-22 NOTE — Patient Instructions (Addendum)
F/U 4 months Amanda Mckay  Ultrasound to be done on RIght leg  Magnesium 400mg  once a day  We will call with lab results We will check the urine

## 2020-05-22 NOTE — Assessment & Plan Note (Signed)
NSR, rate controlled on ASA F/u yearly with cardiology

## 2020-05-22 NOTE — Progress Notes (Signed)
Subjective:    Patient ID: Amanda Mckay, female    DOB: [birth year], 75 y.o.   MRN: [id number]  Patient presents for Follow-up (Medications ) Patient here to follow-up chronic medical problems.  Medications reviewed.  She had significant cardiovascular history with diastolic heart failure atrial fibrillation hypertension.  She is on metoprolol 25 mg twice a day along with Lipitor 10 mg ( three times a week)  and aspirin.   Heart failure/edema she takes Lasix 20 mg with potassium She is taking More regulary  recommended regular use of compression hose but she has not used them  , she has a spot on back of right leg, that has been present for month, NT, but bulges more at times and has bruising color to skin, worse when she has a lot of swelling   Osteopenia with vitamin D deficiency she is on calcium and vitamin D supplementation, she was on fosamax in the past     RLS- wants to try something different such as magnesium since she still gets leg cramps   Review Of Systems:  GEN- denies fatigue, fever, weight loss,weakness, recent illness HEENT- denies eye drainage, change in vision, nasal discharge, CVS- denies chest pain, palpitations RESP- denies SOB, cough, wheeze ABD- denies N/V, change in stools, abd pain GU- denies dysuria, hematuria, dribbling, incontinence MSK- denies joint pain, muscle aches, injury Neuro- denies headache, dizziness, syncope, seizure activity       Objective:    BP 134/76   Temp 98.3 F (36.8 C) (Temporal)   Resp 18   Wt 246 lb 6.4 oz (111.8 kg)   SpO2 97%   BMI 45.07 kg/m  GEN- NAD, alert and oriented x3 HEENT- PERRL, EOMI, non injected sclera, pink conjunctiva, MMM, oropharynx clear Neck- Supple, no thryomegaly CVS- RRR, no murmur RESP-CTAB EXT- 1+ non pitting  Edema, RIght post leg purplish hue with grape size swelling NT  - varicose vein running through  Pulses- Radial, DP- palpated       Assessment & Plan:      Problem List Items  Addressed This Visit      Unprioritized   Atrial fibrillation, chronic - Primary    NSR, rate controlled on ASA F/u yearly with cardiology       Relevant Orders   CBC with Differential/Platelet   Comprehensive metabolic panel   Lipid panel   Chronic diastolic HF (heart failure) (Home)    Currently compensated Concern for leg swelling likely varicosity but localed, could be superficial thrombus as well -obtain US to better evaluate      Relevant Orders   Lipid panel   Osteopenia    Recommend prolia, shei s on calcium and Vitamin D Will look into pricing  She declines bisphosphonate again      Vitamin D deficiency    Other Visit Diagnoses    Leg cramps       can try magnesium 400mg  once a day at bedtime    Relevant Orders   Magnesium   Bad odor of urine       end of visit asked urine to be checked, has odor intermittantly no classic UTI symptoms   Relevant Orders   Urinalysis, Routine w reflex microscopic   Urine Culture   Localized swelling of right lower leg       Relevant Orders   VAS [place] LOWER EXTREMITY VENOUS (DVT)      Note: This dictation was prepared with Dragon dictation along with smaller phrase technology. Any  transcriptional errors that result from this process are unintentional.

## 2020-05-23 LAB — CBC WITH DIFFERENTIAL/PLATELET
Absolute Monocytes: 496 cells/uL (ref 200–950)
Basophils Absolute: 51 cells/uL (ref 0–200)
Basophils Relative: 0.9 %
Eosinophils Absolute: 188 cells/uL (ref 15–500)
Eosinophils Relative: 3.3 %
HCT: 41.9 % (ref 35.0–45.0)
Hemoglobin: 14.4 g/dL (ref 11.7–15.5)
Lymphs Abs: 1476 cells/uL (ref 850–3900)
MCH: 32.8 pg (ref 27.0–33.0)
MCHC: 34.4 g/dL (ref 32.0–36.0)
MCV: 95.4 fL (ref 80.0–100.0)
MPV: 10.2 fL (ref 7.5–12.5)
Monocytes Relative: 8.7 %
Neutro Abs: 3488 cells/uL (ref 1500–7800)
Neutrophils Relative %: 61.2 %
Platelets: 177 10*3/uL (ref 140–400)
RBC: 4.39 10*6/uL (ref 3.80–5.10)
RDW: 12 % (ref 11.0–15.0)
Total Lymphocyte: 25.9 %
WBC: 5.7 10*3/uL (ref 3.8–10.8)

## 2020-05-23 LAB — COMPREHENSIVE METABOLIC PANEL
AG Ratio: 1.6 (calc) (ref 1.0–2.5)
ALT: 13 U/L (ref 6–29)
AST: 17 U/L (ref 10–35)
Albumin: 4.1 g/dL (ref 3.6–5.1)
Alkaline phosphatase (APISO): 73 U/L (ref 37–153)
BUN/Creatinine Ratio: 33 (calc) — ABNORMAL HIGH (ref 6–22)
BUN: 28 mg/dL — ABNORMAL HIGH (ref 7–25)
CO2: 27 mmol/L (ref 20–32)
Calcium: 10.2 mg/dL (ref 8.6–10.4)
Chloride: 106 mmol/L (ref 98–110)
Creat: 0.84 mg/dL (ref 0.60–0.93)
Globulin: 2.5 g/dL (calc) (ref 1.9–3.7)
Glucose, Bld: 86 mg/dL (ref 65–99)
Potassium: 3.7 mmol/L (ref 3.5–5.3)
Sodium: 143 mmol/L (ref 135–146)
Total Bilirubin: 0.5 mg/dL (ref 0.2–1.2)
Total Protein: 6.6 g/dL (ref 6.1–8.1)

## 2020-05-23 LAB — URINE CULTURE
MICRO NUMBER:: 11615327
Result:: NO GROWTH
SPECIMEN QUALITY:: ADEQUATE

## 2020-05-23 LAB — URINALYSIS, ROUTINE W REFLEX MICROSCOPIC
Bacteria, UA: NONE SEEN /HPF
Bilirubin Urine: NEGATIVE
Glucose, UA: NEGATIVE
Hgb urine dipstick: NEGATIVE
Hyaline Cast: NONE SEEN /LPF
Ketones, ur: NEGATIVE
Nitrite: NEGATIVE
Protein, ur: NEGATIVE
RBC / HPF: NONE SEEN /HPF (ref 0–2)
Specific Gravity, Urine: 1.013 (ref 1.001–1.03)
pH: 6.5 (ref 5.0–8.0)

## 2020-05-23 LAB — LIPID PANEL
Cholesterol: 172 mg/dL (ref <200)
HDL: 46 mg/dL — ABNORMAL LOW (ref >=50)
LDL Cholesterol (Calc): 102 mg/dL (calc) — ABNORMAL HIGH
Non-HDL Cholesterol (Calc): 126 mg/dL (calc) (ref <130)
Total CHOL/HDL Ratio: 3.7 (calc) (ref <5.0)
Triglycerides: 140 mg/dL (ref <150)

## 2020-05-23 LAB — MAGNESIUM: Magnesium: 1.9 mg/dL (ref 1.5–2.5)

## 2020-05-28 ENCOUNTER — Telehealth: Payer: Self-pay | Admitting: Family Medicine

## 2020-05-28 NOTE — Telephone Encounter (Signed)
-----   Message from [place], LPN sent at 6/38/4536  5:07 PM EST ----- Regarding: RE: Bone Density/Prolia Re-faxed results.   Prolia benefits verified- requires PA, but covered at 100%. Call placed to patient and patient made aware. States that she will discuss with [facility] and decide if she would like to proceed.  ----- Message ----- From: Alycia Rossetti, MD Sent: 05/22/2020   4:13 PM EST To: [place] Lathe Six, LPN Subject: Bone Density/Prolia                              Can you run cost of prolia through insurance for patient?  Also [facility] needs Bone Density faxed again   [employer]

## 2020-06-07 ENCOUNTER — Encounter (HOSPITAL_COMMUNITY): Payer: Self-pay

## 2020-06-07 ENCOUNTER — Other Ambulatory Visit: Payer: Self-pay

## 2020-06-07 ENCOUNTER — Emergency Department (HOSPITAL_COMMUNITY): Payer: Medicare PPO

## 2020-06-07 ENCOUNTER — Observation Stay (HOSPITAL_COMMUNITY)
Admission: EM | Admit: 2020-06-07 | Discharge: 2020-06-09 | Disposition: A | Discharge status: Home / Self Care | Payer: Medicare PPO | Attending: Internal Medicine | Admitting: Internal Medicine

## 2020-06-07 DIAGNOSIS — K219 Gastro-esophageal reflux disease without esophagitis: Secondary | ICD-10-CM | POA: Insufficient documentation

## 2020-06-07 DIAGNOSIS — G2581 Restless legs syndrome: Secondary | ICD-10-CM | POA: Diagnosis not present

## 2020-06-07 DIAGNOSIS — Z7982 Long term (current) use of aspirin: Secondary | ICD-10-CM | POA: Insufficient documentation

## 2020-06-07 DIAGNOSIS — R2243 Localized swelling, mass and lump, lower limb, bilateral: Secondary | ICD-10-CM | POA: Insufficient documentation

## 2020-06-07 DIAGNOSIS — R29810 Facial weakness: Secondary | ICD-10-CM | POA: Diagnosis present

## 2020-06-07 DIAGNOSIS — E785 Hyperlipidemia, unspecified: Secondary | ICD-10-CM | POA: Diagnosis not present

## 2020-06-07 DIAGNOSIS — E876 Hypokalemia: Secondary | ICD-10-CM | POA: Diagnosis not present

## 2020-06-07 DIAGNOSIS — R609 Edema, unspecified: Secondary | ICD-10-CM

## 2020-06-07 DIAGNOSIS — I480 Paroxysmal atrial fibrillation: Secondary | ICD-10-CM | POA: Diagnosis not present

## 2020-06-07 DIAGNOSIS — G510 Bell's palsy: Secondary | ICD-10-CM | POA: Diagnosis not present

## 2020-06-07 DIAGNOSIS — Z79899 Other long term (current) drug therapy: Secondary | ICD-10-CM | POA: Diagnosis not present

## 2020-06-07 DIAGNOSIS — M069 Rheumatoid arthritis, unspecified: Secondary | ICD-10-CM | POA: Diagnosis not present

## 2020-06-07 DIAGNOSIS — R600 Localized edema: Secondary | ICD-10-CM | POA: Diagnosis present

## 2020-06-07 DIAGNOSIS — E66813 Obesity, class 3: Secondary | ICD-10-CM | POA: Diagnosis present

## 2020-06-07 DIAGNOSIS — I110 Hypertensive heart disease with heart failure: Secondary | ICD-10-CM | POA: Diagnosis not present

## 2020-06-07 DIAGNOSIS — E669 Obesity, unspecified: Secondary | ICD-10-CM | POA: Diagnosis present

## 2020-06-07 DIAGNOSIS — I5032 Chronic diastolic (congestive) heart failure: Secondary | ICD-10-CM | POA: Diagnosis present

## 2020-06-07 DIAGNOSIS — R000 Tachycardia, unspecified: Secondary | ICD-10-CM | POA: Diagnosis not present

## 2020-06-07 DIAGNOSIS — M7989 Other specified soft tissue disorders: Secondary | ICD-10-CM

## 2020-06-07 DIAGNOSIS — Z20822 Contact with and (suspected) exposure to covid-19: Secondary | ICD-10-CM | POA: Insufficient documentation

## 2020-06-07 DIAGNOSIS — I10 Essential (primary) hypertension: Secondary | ICD-10-CM | POA: Diagnosis present

## 2020-06-07 LAB — COMPREHENSIVE METABOLIC PANEL
ALT: 20 U/L (ref 0–44)
AST: 20 U/L (ref 15–41)
Albumin: 4 g/dL (ref 3.5–5.0)
Alkaline Phosphatase: 72 U/L (ref 38–126)
Anion gap: 9 (ref 5–15)
BUN: 31 mg/dL — ABNORMAL HIGH (ref 8–23)
CO2: 25 mmol/L (ref 22–32)
Calcium: 9.2 mg/dL (ref 8.9–10.3)
Chloride: 107 mmol/L (ref 98–111)
Creatinine, Ser: 0.83 mg/dL (ref 0.44–1.00)
GFR, Estimated: >60 mL/min (ref >60)
Glucose, Bld: 115 mg/dL — ABNORMAL HIGH (ref 70–99)
Potassium: 3.4 mmol/L — ABNORMAL LOW (ref 3.5–5.1)
Sodium: 141 mmol/L (ref 135–145)
Total Bilirubin: 0.4 mg/dL (ref 0.3–1.2)
Total Protein: 7 g/dL (ref 6.5–8.1)

## 2020-06-07 LAB — URINALYSIS, ROUTINE W REFLEX MICROSCOPIC
Bilirubin Urine: NEGATIVE
Glucose, UA: NEGATIVE mg/dL
Hgb urine dipstick: NEGATIVE
Ketones, ur: NEGATIVE mg/dL
Leukocytes,Ua: NEGATIVE
Nitrite: NEGATIVE
Protein, ur: NEGATIVE mg/dL
Specific Gravity, Urine: 1.015 (ref 1.005–1.030)
pH: 6 (ref 5.0–8.0)

## 2020-06-07 LAB — ETHANOL: Alcohol, Ethyl (B): <10 mg/dL (ref <10)

## 2020-06-07 LAB — I-STAT CHEM 8, ED
BUN: 32 mg/dL — ABNORMAL HIGH (ref 8–23)
Calcium, Ion: 1.19 mmol/L (ref 1.15–1.40)
Chloride: 105 mmol/L (ref 98–111)
Creatinine, Ser: 0.9 mg/dL (ref 0.44–1.00)
Glucose, Bld: 113 mg/dL — ABNORMAL HIGH (ref 70–99)
HCT: 41 % (ref 36.0–46.0)
Hemoglobin: 13.9 g/dL (ref 12.0–15.0)
Potassium: 3.6 mmol/L (ref 3.5–5.1)
Sodium: 144 mmol/L (ref 135–145)
TCO2: 26 mmol/L (ref 22–32)

## 2020-06-07 LAB — DIFFERENTIAL
Abs Immature Granulocytes: 0.02 10*3/uL (ref 0.00–0.07)
Basophils Absolute: 0.1 10*3/uL (ref 0.0–0.1)
Basophils Relative: 1 %
Eosinophils Absolute: 0.3 10*3/uL (ref 0.0–0.5)
Eosinophils Relative: 4 %
Immature Granulocytes: 0 %
Lymphocytes Relative: 27 %
Lymphs Abs: 2 10*3/uL (ref 0.7–4.0)
Monocytes Absolute: 0.8 10*3/uL (ref 0.1–1.0)
Monocytes Relative: 10 %
Neutro Abs: 4.5 10*3/uL (ref 1.7–7.7)
Neutrophils Relative %: 58 %

## 2020-06-07 LAB — PROTIME-INR
INR: 1 (ref 0.8–1.2)
Prothrombin Time: 12.8 seconds (ref 11.4–15.2)

## 2020-06-07 LAB — RESP PANEL BY RT-PCR (FLU A&B, COVID) ARPGX2
Influenza A by PCR: NEGATIVE
Influenza B by PCR: NEGATIVE
SARS Coronavirus 2 by RT PCR: NEGATIVE

## 2020-06-07 LAB — APTT: aPTT: 26 seconds (ref 24–36)

## 2020-06-07 LAB — CBG MONITORING, ED: Glucose-Capillary: 115 mg/dL — ABNORMAL HIGH (ref 70–99)

## 2020-06-07 LAB — CBC
HCT: 42.7 % (ref 36.0–46.0)
Hemoglobin: 14 g/dL (ref 12.0–15.0)
MCH: 31.8 pg (ref 26.0–34.0)
MCHC: 32.8 g/dL (ref 30.0–36.0)
MCV: 97 fL (ref 80.0–100.0)
Platelets: 197 10*3/uL (ref 150–400)
RBC: 4.4 MIL/uL (ref 3.87–5.11)
RDW: 12.1 % (ref 11.5–15.5)
WBC: 7.6 10*3/uL (ref 4.0–10.5)
nRBC: 0 % (ref 0.0–0.2)

## 2020-06-07 MED ORDER — POTASSIUM CHLORIDE CRYS ER 20 MEQ PO TBCR
40.0000 meq | EXTENDED_RELEASE_TABLET | Freq: Once | ORAL | Status: AC
  Administered 2020-06-07: 40 meq via ORAL
  Filled 2020-06-07: qty 2

## 2020-06-07 MED ORDER — VALACYCLOVIR HCL 500 MG PO TABS
1000.0000 mg | ORAL_TABLET | Freq: Three times a day (TID) | ORAL | Status: DC
  Administered 2020-06-07 – 2020-06-09 (×5): 1000 mg via ORAL
  Filled 2020-06-07 (×5): qty 2

## 2020-06-07 MED ORDER — ASPIRIN 325 MG PO TABS
325.0000 mg | ORAL_TABLET | Freq: Every day | ORAL | Status: DC
  Administered 2020-06-07 – 2020-06-09 (×3): 325 mg via ORAL
  Filled 2020-06-07 (×3): qty 1

## 2020-06-07 MED ORDER — PREDNISONE 20 MG PO TABS
60.0000 mg | ORAL_TABLET | Freq: Every day | ORAL | Status: DC
  Administered 2020-06-08 – 2020-06-09 (×2): 60 mg via ORAL
  Filled 2020-06-07 (×2): qty 3

## 2020-06-07 NOTE — ED Notes (Signed)
I assumed care of patient at this time. Pt currently on tele Neuro video being assessed. Will introduce self to patient when she's finished.

## 2020-06-07 NOTE — H&P (Addendum)
History and Physical    Amanda Mckay HXT:[id number] DOB: 17-Jun-1945 DOA: 06/07/2020  PCP: Alycia Rossetti, MD   Patient coming from: Home  I have personally briefly reviewed patient's old medical records in [place]  Chief Complaint: Facial droop  HPI: Amanda Mckay is a 75 y.o. female with medical history significant for paroxysmal atrial fibrillation, hypertension, diastolic CHF, restless leg syndrome, obesity, residual hemochromatosis. To the ED with complaints of left facial droop, decreased sensation to the left face that started at about 10 AM today.  She reports over next several hours her symptoms progressed. She report change in vision involving her left eye, and some difficulty closing her left eye.  She also reports there was a change in the taste of her cereal which she had for breakfast and her spagetti and meat balls which she had for lunch.  She denies difficulty swallowing.  She denies weakness or abnormal sensation of her bilateral upper and lower extremities.  She denies recent known viral illness.  She reports compliance with her medications which include aspirin 325 mg daily and Lipitor 10 mg 3 times/wk.  She has never been on anticoagulation for atrial fibrillation which was diagnosed about 10 years ago.  ED Course: Tachycardic to 118, respiratory rate 13-18.  Blood pressure systolic 165V to 374M.  O2 sats greater than 98% on room air.  Head CT negative for acute abnormality.  EKG shows sinus tachycardia. Tele neurology Consulted, concern for Bell's palsy, recommended MRI brain with and without contrast, to rule out stroke, and if normal MRI recommended starting prednisone 60 mg daily for 1 week and valacyclovir 1000 mg 3 times daily for 1 week.  Review of Systems: As per HPI all other systems reviewed and negative.  Past Medical History:  Diagnosis Date  . Arthritis   . Atrial fibrillation ([place])   . Cataract   . Dysrhythmia    AFib  . GERD (gastroesophageal  reflux disease)   . Hemochromatosis   . Iron excess     Past Surgical History:  Procedure Laterality Date  . CESAREAN SECTION    . KNEE ARTHROSCOPY WITH MEDIAL MENISECTOMY Right 01/28/2019   Procedure: RIGHT KNEE ARTHROSCOPY WITH MEDIAL MENISCECTOMY;  Surgeon: Carole Civil, MD;  Location: [facility];  Service: Orthopedics;  Laterality: Right;  . TONSILLECTOMY    . VENTRAL HERNIA REPAIR N/A 01/26/2018   Procedure: HERNIA REPAIR VENTRAL ADULT WITH MESH;  Surgeon: Virl Cagey, MD;  Location: [facility];  Service: General;  Laterality: N/A;     reports that she has never smoked. She has never used smokeless tobacco. She reports current alcohol use of about 2.0 standard drinks of alcohol per week. She reports that she does not use drugs.  Allergies  Allergen Reactions  . Tetanus Toxoids Other (See Comments)    Reaction occurred more than 40 yrs ago--does not remember what occurred.    Family History  Problem Relation Age of Onset  . Stroke Mother   . Atrial fibrillation Son   . Stroke Maternal Grandmother   . Diabetes Paternal Grandfather   . Arthritis Brother     Prior to Admission medications   Medication Sig Start Date End Date Taking? Authorizing Provider  acetaminophen (TYLENOL) 650 MG CR tablet Take 650-1,300 mg by mouth every 8 (eight) hours as needed for pain.   Yes [provider]  aspirin 325 MG tablet Take 325 mg by mouth every evening.    Yes [provider]  atorvastatin (LIPITOR) 10 MG tablet Take 1 tablet (10 mg total) by mouth 3 (three) times a week. At bedtime. 09/10/19  Yes [place], Modena Nunnery, MD  Calcium Carb-Cholecalciferol (CALCIUM 600+D3 PO) Take 1 tablet by mouth 2 (two) times daily.   Yes [provider]  Cholecalciferol 25 MCG (1000 UT) capsule Take 1,000 Units by mouth daily.   Yes [provider]  clotrimazole (LOTRIMIN AF) 1 % cream Apply 1 application topically 2 (two) times daily. Patient taking differently:  Apply 1 application topically 2 (two) times daily. Applied to toes twice daily for fungus 12/28/18  Yes [place], Cammie Mcgee, MD  diclofenac (CATAFLAM) 50 MG tablet TAKE ONE TABLET (50MG  TOTAL) BY MOUTH TWO TIMES DAILY Patient taking differently: Take 50 mg by mouth 2 (two) times daily. 05/12/20  Yes Carole Civil, MD  furosemide (LASIX) 20 MG tablet TAKE ONE TABLET (20MG  TOTAL) BY MOUTH DAILY AS NEEDED Patient taking differently: Take 20 mg by mouth daily. 01/28/20  Yes [place], Modena Nunnery, MD  magnesium oxide (MAG-OX) 400 MG tablet Take 400 mg by mouth daily.   Yes [provider]  metoprolol tartrate (LOPRESSOR) 25 MG tablet Take 1 tablet (25 mg total) by mouth 2 (two) times daily. 05/05/20  Yes [place], [place] M, PA-C  potassium chloride (KLOR-CON) 10 MEQ tablet Take 1 po daily with lasix for leg cramps 12/15/19  Yes [place], Modena Nunnery, MD  trolamine salicylate (ASPERCREME) 10 % cream Apply 1 application topically 2 (two) times daily as needed for muscle pain.    Yes [provider]  calcium carbonate (OSCAL) 1500 (600 Ca) MG TABS tablet     [provider]    Physical Exam: Vitals:   06/07/20 1830 06/07/20 1845 06/07/20 1950 06/07/20 2032  BP:  (!) 125/96 (!) 154/74 (!) 141/65  Pulse:  (!) 118 96 (!) 104  Resp:   18 16  Temp: 98.1 F (36.7 C)     TempSrc: Oral     SpO2:  100% 98% 100%  Weight:      Height:        Constitutional: NAD, calm, comfortable Vitals:   06/07/20 1830 06/07/20 1845 06/07/20 1950 06/07/20 2032  BP:  (!) 125/96 (!) 154/74 (!) 141/65  Pulse:  (!) 118 96 (!) 104  Resp:   18 16  Temp: 98.1 F (36.7 C)     TempSrc: Oral     SpO2:  100% 98% 100%  Weight:      Height:       Eyes: PERRL,  lids and conjunctivae normal ENMT: Mucous membranes are moist. Posterior pharynx clear of any exudate or lesions.Normal dentition.  Neck: normal, supple, no masses, no thyromegaly Respiratory: clear to auscultation bilaterally, no wheezing, no  crackles. Normal respiratory effort. No accessory muscle use.  Cardiovascular: Regular rate and rhythm, no murmurs / rubs / gallops. Trace pitting extremity edema-chronic and unchanged.  Palpable nodular and firm subcutaneous swelling posterior right calf, and to a much milder degree involving left calf. 2+ pedal pulses.   Abdomen: no tenderness, no masses palpated. No hepatosplenomegaly. Bowel sounds positive.  Musculoskeletal: no clubbing / cyanosis. No joint deformity upper and lower extremities.  Reduced range of motion bilateral lower extremity due to arthritic pain involving both knees,  no contractures. Normal muscle tone.  Skin: no rashes, lesions, ulcers. No induration Neurologic: Apparent facial asymmetry noted, with loss of left nasolabial fold, abnormal sensation left side of face, she is able to wrinkle her left  forehead but to a lesser degree compared to the right.  She has difficulty closing her left eye, but she is able to squeeze it shut with effort.  Speech clear fluent and coherent without evidence of aphasia.   5/5 strength bilateral upper extremity, limited examination of bilateral lower extremity due to chronic pain involving both knees.   Psychiatric: Normal judgment and insight. Alert and oriented x 3. Normal mood.   Labs on Admission: I have personally reviewed following labs and imaging studies  CBC: Recent Labs  Lab 06/07/20 1833 06/07/20 1837  WBC 7.6  --   NEUTROABS 4.5  --   HGB 14.0 13.9  HCT 42.7 41.0  MCV 97.0  --   PLT 197  --    Basic Metabolic Panel: Recent Labs  Lab 06/07/20 1833 06/07/20 1837  NA 141 144  K 3.4* 3.6  CL 107 105  CO2 25  --   GLUCOSE 115* 113*  BUN 31* 32*  CREATININE 0.83 0.90  CALCIUM 9.2  --    Liver Function Tests: Recent Labs  Lab 06/07/20 1833  AST 20  ALT 20  ALKPHOS 72  BILITOT 0.4  PROT 7.0  ALBUMIN 4.0   Coagulation Profile: Recent Labs  Lab 06/07/20 1833  INR 1.0   CBG: Recent Labs  Lab  06/07/20 1819  GLUCAP 115*   Urine analysis:    Component Value Date/Time   COLORURINE STRAW (A) 06/07/2020 1943   APPEARANCEUR CLEAR 06/07/2020 1943   LABSPEC 1.015 06/07/2020 1943   PHURINE 6.0 06/07/2020 1943   GLUCOSEU NEGATIVE 06/07/2020 1943   HGBUR NEGATIVE 06/07/2020 1943   BILIRUBINUR NEGATIVE 06/07/2020 1943   KETONESUR NEGATIVE 06/07/2020 1943   PROTEINUR NEGATIVE 06/07/2020 1943   NITRITE NEGATIVE 06/07/2020 1943   LEUKOCYTESUR NEGATIVE 06/07/2020 1943    Radiological Exams on Admission: CT HEAD WO CONTRAST  Result Date: 06/07/2020 CLINICAL DATA:  Left-sided facial droop and weakness since 10 a.m. EXAM: CT HEAD WITHOUT CONTRAST TECHNIQUE: Contiguous axial images were obtained from the base of the skull through the vertex without intravenous contrast. COMPARISON:  None. FINDINGS: Brain: No acute infarct or hemorrhage. Lateral ventricles and midline structures are unremarkable. No acute extra-axial fluid collections. No mass effect. Vascular: No hyperdense vessel or unexpected calcification. Skull: Normal. Negative for fracture or focal lesion. Sinuses/Orbits: No acute finding. Other: None. IMPRESSION: 1. No acute intracranial process. Electronically Signed   By: Randa Ngo M.D.   On: 06/07/2020 19:16    EKG: Independently reviewed.  Sinus tachycardia rate 114.  QTc 470.  No significant ST-T wave changes compared to prior.  Assessment/Plan Principal Problem:   Facial droop Active Problems:   Hyperlipidemia   Hereditary hemochromatosis (HCC)   Essential hypertension   Chronic diastolic HF (heart failure) ([facility])   Peripheral edema   Class 3 obesity   AF (paroxysmal atrial fibrillation) ([facility])  Facial droop/abnormal taste-concern for Bell's palsy.  Deficits involving mostly distribution of facial nerve.  Also risk factor for stroke-  history of atrial fibrillation on antiplatelet with aspirin 325 mg daily, reports compliance. -Telemetry neurology consulted,  recommended MRI brain, [facility], if no abnormalities on MRI other than possible 7th nerve enhancement, start prednisone 60 mg daily for 1 week and valacyclovir 1000 mg 3 times daily for 1 week. -Likely delay in transfer to [facility], will start medications pending MRI brain with and without contrast -Please consult neurology in the morning -Resume home aspirin 325 mg daily -Resume Atorvastatin 10 mg ,  will need to increase dose if stroke confirmed -Allow for permissive hypertension for now pending stroke rule out -Lipid panel, A1c in a.m. - Stroke swallow screen -Further stroke work-up pending MRI findings. - Monitor glucose if  Steroids required  Paroxysmal atrial fibrillation-currently in sinus rhythm, heart rate 96 - 118.  On 325 mg aspirin daily and compliant.  Reports as she had done well on aspirin since diagnosis ~10 years ago, and not wanting to increase her pill load, she declined anticoagulation when this was offered by her then new cardiologist Dr. Bronson Ing 2019.  - CHAdsVacs score- 4 ( Age, sex and HTN, CHF) 14 (if stroke confirmed). -No history of GI bleed, denies falls. -Hold metoprolol 25mg  BID , resume pending stroke rule out -Aspirin 325mg    Hypertension-120s to 150s. -Hold metoprolol for now pending stroke rule out -As needed labetalol for systolic > 416.  Diastolic CHF-stable and compensated, with mild chronic unchanged lower extremity swelling.  No echo on file. -Resume Lasix 20 daily  Lower extremity swelling- localized nodular swelling palpated posterior legs.  Patient reports she had outpatient ultrasound planned by PCP to rule out DVT, she is requesting if it can be done during hospitalization -Billateral venous Dopplers  Dyslipidemia -Resume home statin 10 mg 3 times/wk.  Hypokalemia mild 3.4.  Replete   DVT prophylaxis: Lovenox Code Status: Full code Family Communication: None at bedside Disposition Plan: ~ 1- 2 days Consults called: Pls  consult neurology in a.m Admission status: Obs, tele.   Bethena Roys MD [facility]  06/07/2020, 10:00 PM

## 2020-06-07 NOTE — ED Notes (Signed)
Pt ambulated to bathroom with x1 assist. Gait steady but slow. No c/o SOB, chest pain, dizziness or lightheadedness.

## 2020-06-07 NOTE — ED Triage Notes (Signed)
Pt presents to ED with complaints of left sided facial droop and decreased sensation on left side of face, started at 1000 this am. Dr Kathrynn Humble at bedside.

## 2020-06-07 NOTE — ED Provider Notes (Signed)
[facility] EMERGENCY DEPARTMENT Provider Note   CSN: [id number] Arrival date & time: 06/07/20  1814     History Chief Complaint  Patient presents with  . Facial Droop    Amanda Mckay is a 75 y.o. female.  HPI    75 year old female comes in with chief complaint of facial droop.  She has history of A. fib is on aspirin for it.  Patient reports that she woke up feeling well.  She had breakfast around 9:30, and was fine.  Later on when she was having lunch she noted that she had some changes to the left side of her face.  She noted facial droop, change in taste and also some sensory issues to the left side of her face.  Her symptoms have intensified a little bit over time.  Review of system is also positive for blurry vision.  Past Medical History:  Diagnosis Date  . Arthritis   . Atrial fibrillation ([place])   . Cataract   . Dysrhythmia    AFib  . GERD (gastroesophageal reflux disease)   . Hemochromatosis   . Iron excess     Patient Active Problem List   Diagnosis Date Noted  . Facial droop 06/07/2020  . AF (paroxysmal atrial fibrillation) ([place]) 06/07/2020  . RLS (restless legs syndrome) 01/04/2020  . Osteopenia 12/31/2019  . Class 3 obesity 09/01/2019  . Pincer nail deformity 06/10/2019  . S/P right knee arthroscopy 01/28/2019 03/08/2019  . Derangement of posterior horn of medial meniscus of right knee   . Primary osteoarthritis of right knee   . Pain due to onychomycosis of toenails of both feet 12/07/2018  . Peripheral edema 12/01/2018  . Essential hypertension   . Chronic diastolic HF (heart failure) ([place])   . Ventral hernia without obstruction or gangrene 08/01/2017  . Hyperlipidemia 02/27/2017  . Vitamin D deficiency 02/27/2017  . Hereditary hemochromatosis ([place]) 02/27/2017  . Atrial fibrillation, chronic 02/27/2017    Past Surgical History:  Procedure Laterality Date  . CESAREAN SECTION    . KNEE ARTHROSCOPY WITH MEDIAL MENISECTOMY Right 01/28/2019    Procedure: RIGHT KNEE ARTHROSCOPY WITH MEDIAL MENISCECTOMY;  Surgeon: Carole Civil, MD;  Location: [facility];  Service: Orthopedics;  Laterality: Right;  . TONSILLECTOMY    . VENTRAL HERNIA REPAIR N/A 01/26/2018   Procedure: HERNIA REPAIR VENTRAL ADULT WITH MESH;  Surgeon: Virl Cagey, MD;  Location: [facility];  Service: General;  Laterality: N/A;     OB History    Gravida      Para      Term      Preterm      AB      Living  3     SAB      IAB      Ectopic      Multiple      Live Births              Family History  Problem Relation Age of Onset  . Stroke Mother   . Atrial fibrillation Son   . Stroke Maternal Grandmother   . Diabetes Paternal Grandfather   . Arthritis Brother     Social History   Tobacco Use  . Smoking status: Never Smoker  . Smokeless tobacco: Never Used  Vaping Use  . Vaping Use: Never used  Substance Use Topics  . Alcohol use: Yes    Alcohol/week: 2.0 standard drinks    Types: 2 Glasses of wine per week  . Drug  use: No    Home Medications Prior to Admission medications   Medication Sig Start Date End Date Taking? Authorizing Provider  acetaminophen (TYLENOL) 650 MG CR tablet Take 650-1,300 mg by mouth every 8 (eight) hours as needed for pain.   Yes [provider]  aspirin 325 MG tablet Take 325 mg by mouth every evening.    Yes [provider]  atorvastatin (LIPITOR) 10 MG tablet Take 1 tablet (10 mg total) by mouth 3 (three) times a week. At bedtime. 09/10/19  Yes Erick, Modena Nunnery, MD  Calcium Carb-Cholecalciferol (CALCIUM 600+D3 PO) Take 1 tablet by mouth 2 (two) times daily.   Yes [provider]  Cholecalciferol 25 MCG (1000 UT) capsule Take 1,000 Units by mouth daily.   Yes [provider]  clotrimazole (LOTRIMIN AF) 1 % cream Apply 1 application topically 2 (two) times daily. Patient taking differently: Apply 1 application topically 2 (two) times daily. Applied to toes twice daily  for fungus 12/28/18  Yes Pickard, Cammie Mcgee, MD  diclofenac (CATAFLAM) 50 MG tablet TAKE ONE TABLET (50MG  TOTAL) BY MOUTH TWO TIMES DAILY Patient taking differently: Take 50 mg by mouth 2 (two) times daily. 05/12/20  Yes Carole Civil, MD  furosemide (LASIX) 20 MG tablet TAKE ONE TABLET (20MG  TOTAL) BY MOUTH DAILY AS NEEDED Patient taking differently: Take 20 mg by mouth daily. 01/28/20  Yes [place], Modena Nunnery, MD  magnesium oxide (MAG-OX) 400 MG tablet Take 400 mg by mouth daily.   Yes [provider]  metoprolol tartrate (LOPRESSOR) 25 MG tablet Take 1 tablet (25 mg total) by mouth 2 (two) times daily. 05/05/20  Yes Strader, [place] M, PA-C  potassium chloride (KLOR-CON) 10 MEQ tablet Take 1 po daily with lasix for leg cramps 12/15/19  Yes Mona, Modena Nunnery, MD  trolamine salicylate (ASPERCREME) 10 % cream Apply 1 application topically 2 (two) times daily as needed for muscle pain.    Yes [provider]  calcium carbonate (OSCAL) 1500 (600 Ca) MG TABS tablet     [provider]    Allergies    Tetanus toxoids  Review of Systems   Review of Systems  Constitutional: Positive for activity change.  Eyes: Positive for visual disturbance.  Respiratory: Negative for shortness of breath.   Cardiovascular: Negative for chest pain.  Neurological: Positive for weakness and numbness. Negative for dizziness and headaches.  All other systems reviewed and are negative.   Physical Exam Updated Vital Signs BP 131/77 (BP Location: Left Arm)   Pulse (!) 110   Temp 98.1 F (36.7 C) (Oral)   Resp 17   Ht 5\' 6"  (1.676 m)   Wt 111.1 kg   SpO2 97%   BMI 39.54 kg/m   Physical Exam Vitals and nursing note reviewed.  Constitutional:      Appearance: She is well-developed.  HENT:     Head: Normocephalic and atraumatic.  Cardiovascular:     Rate and Rhythm: Normal rate.  Pulmonary:     Effort: Pulmonary effort is normal.  Abdominal:     General: Bowel sounds are  normal.  Musculoskeletal:     Cervical back: Normal range of motion and neck supple.  Skin:    General: Skin is warm and dry.  Neurological:     Mental Status: She is alert and oriented to person, place, and time.     Comments: Left-sided facial droop -it appears that there is no central sparing. Gross sensory deficit to the left  side of the face Upper and lower extremity strength is 4+ out of 5 bilaterally Cerebellar exam revealed no dysmetria No nystagmus Patient has blurry vision through her left eye     ED Results / Procedures / Treatments   Labs (all labs ordered are listed, but only abnormal results are displayed) Labs Reviewed  COMPREHENSIVE METABOLIC PANEL - Abnormal; Notable for the following components:      Result Value   Potassium 3.4 (*)    Glucose, Bld 115 (*)    BUN 31 (*)    All other components within normal limits  URINALYSIS, ROUTINE W REFLEX MICROSCOPIC - Abnormal; Notable for the following components:   Color, Urine STRAW (*)    All other components within normal limits  CBG MONITORING, ED - Abnormal; Notable for the following components:   Glucose-Capillary 115 (*)    All other components within normal limits  I-STAT CHEM 8, ED - Abnormal; Notable for the following components:   BUN 32 (*)    Glucose, Bld 113 (*)    All other components within normal limits  RESP PANEL BY RT-PCR (FLU A&B, COVID) ARPGX2  ETHANOL  PROTIME-INR  APTT  CBC  DIFFERENTIAL    EKG EKG Interpretation  Date/Time:  Wednesday June 07 2020 18:25:21 EDT Ventricular Rate:  114 PR Interval:    QRS Duration: 92 QT Interval:  341 QTC Calculation: 470 R Axis:   1 Text Interpretation: Sinus tachycardia Low voltage, precordial leads Abnormal R-wave progression, early transition No significant change since last tracing Confirmed by Varney Biles [id number]) on 06/07/2020 7:05:05 PM   Radiology CT HEAD WO CONTRAST  Result Date: 06/07/2020 CLINICAL DATA:  Left-sided facial droop  and weakness since 10 a.m. EXAM: CT HEAD WITHOUT CONTRAST TECHNIQUE: Contiguous axial images were obtained from the base of the skull through the vertex without intravenous contrast. COMPARISON:  None. FINDINGS: Brain: No acute infarct or hemorrhage. Lateral ventricles and midline structures are unremarkable. No acute extra-axial fluid collections. No mass effect. Vascular: No hyperdense vessel or unexpected calcification. Skull: Normal. Negative for fracture or focal lesion. Sinuses/Orbits: No acute finding. Other: None. IMPRESSION: 1. No acute intracranial process. Electronically Signed   By: Randa Ngo M.D.   On: 06/07/2020 19:16    Procedures Procedures   Medications Ordered in ED Medications  aspirin tablet 325 mg (325 mg Oral Given 06/07/20 2212)  predniSONE (DELTASONE) tablet 60 mg (has no administration in time range)  valACYclovir (VALTREX) tablet 1,000 mg (1,000 mg Oral Given 06/07/20 2212)  potassium chloride SA (KLOR-CON) CR tablet 40 mEq (40 mEq Oral Given 06/07/20 2212)    ED Course  I have reviewed the triage vital signs and the nursing notes.  Pertinent labs & imaging results that were available during my care of the patient were reviewed by me and considered in my medical decision making (see chart for details).    MDM Rules/Calculators/A&P                          75 year old female comes in a chief complaint of facial droop.  She has history of A. fib and is on aspirin for it.  No history of strokes.  Symptoms started earlier today, she is well outside of TPA window and her screen for LVO is negative.  Concerns for embolic stroke. Other possibility is Bell's palsy -atypical presentation which is also sensory deficits.  Discussed case with teleneurology. They recommended patient be admitted to the  hospital for MRI.  Hospital staff to see the patient and transferred him to [facility] as per the request of neurology team.  Final Clinical Impression(s) / ED  Diagnoses Final diagnoses:  Facial droop    Rx / DC Orders ED Discharge Orders    None       Varney Biles, MD 06/07/20 2242

## 2020-06-07 NOTE — Consult Note (Signed)
[facility] Telemedicine Consult   Requesting Provider: Dr. Kathrynn Humble Consult Participants: Patient, Nurse Ayesha Rumpf, Husband Location of the provider: [facility] Location of the patient: [facility]  This consult was provided via telemedicine with 2-way video and audio communication. The patient/family was informed that care would be provided in this way and agreed to receive care in this manner.    Chief Complaint: Left facial droop   HPI: Amanda Mckay is a 75 y.o. woman with a past medical history significant for atrial fibrillation not on anticoagulation, heart failure, arthritis, hemochromatosis, presenting with left-sided facial droop.  She reports that this drip started at 10 AM and has been gradually progressive.  It has been associated with slight change in sensation of the face as well as a change in taste.  She additionally feels like she has reduced hearing in the left ear when she self tests via video with finger rub.  Regarding her history of atrial fibrillation she reports that she was diagnosed about 15 years ago when she had such significant symptoms that 4-day hospitalization was needed to control her rate and symptoms.  However she has been managed on aspirin 325 mg.  She denies any significant bleeding history.  She reports she was offered Eliquis when she moved to this area 4 years ago but she elected to continue just the aspirin as it had been working well for her and she feels she is taking a large number of medications already.  She does continue to have some intermittent brief palpitations but these are fairly infrequent  At baseline she does have some difficulty with her bilateral lower extremities due to arthritis and back pain  LKW: 10:30 AM tpa given?: No, out of the window   Modified Rankin Scale: 2-Slight disability-UNABLE to perform all activities but does not need assistance Time of teleneurologist evaluation: 7:20 p.m.  Past Medical History:  Diagnosis  Date  . Arthritis   . Atrial fibrillation ([place])   . Cataract   . Dysrhythmia    AFib  . GERD (gastroesophageal reflux disease)   . Hemochromatosis   . Iron excess    Past Surgical History:  Procedure Laterality Date  . CESAREAN SECTION    . KNEE ARTHROSCOPY WITH MEDIAL MENISECTOMY Right 01/28/2019   Procedure: RIGHT KNEE ARTHROSCOPY WITH MEDIAL MENISCECTOMY;  Surgeon: Carole Civil, MD;  Location: [facility];  Service: Orthopedics;  Laterality: Right;  . TONSILLECTOMY    . VENTRAL HERNIA REPAIR N/A 01/26/2018   Procedure: HERNIA REPAIR VENTRAL ADULT WITH MESH;  Surgeon: Virl Cagey, MD;  Location: [facility];  Service: General;  Laterality: N/A;   Current Outpatient Medications  Medication Instructions  . acetaminophen (TYLENOL) 650-1,300 mg, Oral, Every 8 hours PRN  . aspirin 325 mg, Oral, Every evening  . atorvastatin (LIPITOR) 10 mg, Oral, 3 times weekly, At bedtime.  . Calcium Carb-Cholecalciferol (CALCIUM 600+D3 PO) 1 tablet, Oral, 2 times daily  . Cholecalciferol 1,000 Units, Oral, Daily  . clotrimazole (LOTRIMIN AF) 1 % cream 1 application, Topical, 2 times daily  . diclofenac (CATAFLAM) 50 MG tablet TAKE ONE TABLET (50MG  TOTAL) BY MOUTH TWO TIMES DAILY  . furosemide (LASIX) 20 MG tablet TAKE ONE TABLET (20MG  TOTAL) BY MOUTH DAILY AS NEEDED  . metoprolol tartrate (LOPRESSOR) 25 mg, Oral, 2 times daily  . potassium chloride (KLOR-CON) 10 MEQ tablet Take 1 po daily with lasix for leg cramps  . trolamine salicylate (ASPERCREME) 10 % cream 1 application, Topical, 2 times daily PRN  Exam: Vitals:   06/07/20 1830 06/07/20 1845  BP:  (!) 125/96  Pulse:  (!) 118  Resp:    Temp: 98.1 F (36.7 C)   SpO2:  100%    General: Comfortable with no acute distress Pulmonary: breathing comfortably Cardiac: regular rate and rhythm on monitor   NIH Stroke scale 1A: Level of Consciousness - 0 1B: Ask Month and Age - 0 1C: 'Blink Eyes' & 'Squeeze Hands' - 0 2: Test  Horizontal Extraocular Movements - 0 3: Test Visual Fields - 0 4: Test Facial Palsy - 3 5A: Test Left Arm Motor Drift - 0 5B: Test Right Arm Motor Drift - 0 6A: Test Left Leg Motor Drift - 2 (chronic) 6B: Test Right Leg Motor Drift - 2 (chronic) 7: Test Limb Ataxia - 0 8: Test Sensation - 0 9: Test Language/Aphasia- 0 10: Test Dysarthria - 1 11: Test Extinction/Inattention - 0 NIHSS score: 4 for new symptoms   Imaging Reviewed:  Head CT without acute intracranial process  Labs reviewed in epic and pertinent values follow: Creatinine 0.9 BUN 32 CBC within normal limits Glucose 113  Assessment: This is a 75 year old woman with significant stroke risk factor of atrial fibrillation off of anticoagulation.  However the history she gives of gradually progressive left facial droop as well as her examination with notable weak eye closure and slowed blink rate on the left compared to the right is most consistent with a Bell's palsy.  Typically would expect hyperacusis rather than hypoacusis on the affected side and it is unclear to me if this is simply a limitation of the televideo format or potentially a chronic issue that she has not noticed before.  Given his atypical finding, recommend MRI brain with and without contrast.  Recommendations:  -MRI brain with and without contrast, thin cuts through the brainstem -If stroke is ruled out and there is no abnormal finding on MRI brain other than possible 7th nerve enhancement, would treat with prednisone 60 mg daily for 1 week as well as valacyclovir 1000 mg 3 times daily for 1 week; patient should also be advised to use eyedrops frequently in the affected eye, use a moisture chamber at night, and have an outpatient neurology follow-up in 3 to 4 months to confirm symptoms are improving and no new problems have arisen -Should MRI brain show other unexpected findings or additional neurological complaints develop, please obtain full neurology  consult  This patient is receiving care for possible acute neurological changes. There was 42 minutes of care by this provider at the time of service, including time for direct evaluation via telemedicine, review of medical records, imaging studies and discussion of findings with providers, the patient and/or family.  Lesleigh Noe MD-PhD [facility] [phone]   If 8pm-8am, please page neurology on call as listed in [place].

## 2020-06-08 ENCOUNTER — Ambulatory Visit: Payer: Medicare PPO | Admitting: Podiatry

## 2020-06-08 ENCOUNTER — Observation Stay (HOSPITAL_COMMUNITY): Payer: Medicare PPO

## 2020-06-08 ENCOUNTER — Observation Stay (HOSPITAL_BASED_OUTPATIENT_CLINIC_OR_DEPARTMENT_OTHER): Payer: Medicare PPO

## 2020-06-08 DIAGNOSIS — Z7982 Long term (current) use of aspirin: Secondary | ICD-10-CM | POA: Diagnosis not present

## 2020-06-08 DIAGNOSIS — R2243 Localized swelling, mass and lump, lower limb, bilateral: Secondary | ICD-10-CM | POA: Diagnosis not present

## 2020-06-08 DIAGNOSIS — I110 Hypertensive heart disease with heart failure: Secondary | ICD-10-CM | POA: Diagnosis not present

## 2020-06-08 DIAGNOSIS — Z79899 Other long term (current) drug therapy: Secondary | ICD-10-CM | POA: Diagnosis not present

## 2020-06-08 DIAGNOSIS — R29810 Facial weakness: Secondary | ICD-10-CM

## 2020-06-08 DIAGNOSIS — I4891 Unspecified atrial fibrillation: Secondary | ICD-10-CM | POA: Diagnosis not present

## 2020-06-08 DIAGNOSIS — G2581 Restless legs syndrome: Secondary | ICD-10-CM | POA: Diagnosis not present

## 2020-06-08 DIAGNOSIS — I10 Essential (primary) hypertension: Secondary | ICD-10-CM | POA: Diagnosis not present

## 2020-06-08 DIAGNOSIS — M069 Rheumatoid arthritis, unspecified: Secondary | ICD-10-CM | POA: Diagnosis not present

## 2020-06-08 DIAGNOSIS — Z20822 Contact with and (suspected) exposure to covid-19: Secondary | ICD-10-CM | POA: Diagnosis not present

## 2020-06-08 DIAGNOSIS — I480 Paroxysmal atrial fibrillation: Secondary | ICD-10-CM | POA: Diagnosis not present

## 2020-06-08 DIAGNOSIS — E876 Hypokalemia: Secondary | ICD-10-CM | POA: Diagnosis not present

## 2020-06-08 DIAGNOSIS — I5032 Chronic diastolic (congestive) heart failure: Secondary | ICD-10-CM | POA: Diagnosis not present

## 2020-06-08 DIAGNOSIS — K219 Gastro-esophageal reflux disease without esophagitis: Secondary | ICD-10-CM | POA: Diagnosis not present

## 2020-06-08 DIAGNOSIS — E785 Hyperlipidemia, unspecified: Secondary | ICD-10-CM | POA: Diagnosis not present

## 2020-06-08 DIAGNOSIS — I639 Cerebral infarction, unspecified: Secondary | ICD-10-CM | POA: Diagnosis not present

## 2020-06-08 DIAGNOSIS — G510 Bell's palsy: Secondary | ICD-10-CM

## 2020-06-08 LAB — LIPID PANEL
Cholesterol: 165 mg/dL (ref 0–200)
HDL: 40 mg/dL — ABNORMAL LOW (ref >40)
LDL Cholesterol: 99 mg/dL (ref 0–99)
Total CHOL/HDL Ratio: 4.1 RATIO
Triglycerides: 131 mg/dL (ref <150)
VLDL: 26 mg/dL (ref 0–40)

## 2020-06-08 LAB — ECHOCARDIOGRAM COMPLETE
Area-P 1/2: 3.6 cm2
Calc EF: 69.3 %
Height: 66 in
S' Lateral: 2.5 cm
Single Plane A2C EF: 68.6 %
Single Plane A4C EF: 67.7 %
Weight: 3920 oz

## 2020-06-08 LAB — HEMOGLOBIN A1C
Hgb A1c MFr Bld: 5.4 % (ref 4.8–5.6)
Mean Plasma Glucose: 108.28 mg/dL

## 2020-06-08 MED ORDER — ENOXAPARIN SODIUM 40 MG/0.4ML ~~LOC~~ SOLN
40.0000 mg | Freq: Every day | SUBCUTANEOUS | Status: DC
  Administered 2020-06-08 – 2020-06-09 (×2): 40 mg via SUBCUTANEOUS
  Filled 2020-06-08 (×2): qty 0.4

## 2020-06-08 MED ORDER — ACETAMINOPHEN 160 MG/5ML PO SOLN: 650.0000 mg | ORAL | Status: DC | PRN

## 2020-06-08 MED ORDER — POTASSIUM CHLORIDE CRYS ER 10 MEQ PO TBCR
10.0000 meq | EXTENDED_RELEASE_TABLET | Freq: Every day | ORAL | Status: DC
  Administered 2020-06-08 – 2020-06-09 (×2): 10 meq via ORAL
  Filled 2020-06-08 (×2): qty 1

## 2020-06-08 MED ORDER — SENNOSIDES-DOCUSATE SODIUM 8.6-50 MG PO TABS: 1.0000 | ORAL_TABLET | Freq: Every evening | ORAL | Status: DC | PRN

## 2020-06-08 MED ORDER — STROKE: EARLY STAGES OF RECOVERY BOOK
Freq: Once | Status: AC
  Filled 2020-06-08: qty 1

## 2020-06-08 MED ORDER — ATORVASTATIN CALCIUM 10 MG PO TABS
10.0000 mg | ORAL_TABLET | ORAL | Status: DC
  Administered 2020-06-08: 10 mg via ORAL
  Filled 2020-06-08: qty 1

## 2020-06-08 MED ORDER — LABETALOL HCL 5 MG/ML IV SOLN: 10.0000 mg | INTRAVENOUS | Status: DC | PRN

## 2020-06-08 MED ORDER — PANTOPRAZOLE SODIUM 40 MG PO TBEC
40.0000 mg | DELAYED_RELEASE_TABLET | Freq: Every day | ORAL | Status: DC
  Administered 2020-06-09: 40 mg via ORAL
  Filled 2020-06-08: qty 1

## 2020-06-08 MED ORDER — LORAZEPAM 2 MG/ML IJ SOLN
1.0000 mg | Freq: Once | INTRAMUSCULAR | Status: AC
  Administered 2020-06-08: 1 mg via INTRAVENOUS
  Filled 2020-06-08: qty 1

## 2020-06-08 MED ORDER — METOPROLOL TARTRATE 25 MG PO TABS
25.0000 mg | ORAL_TABLET | Freq: Two times a day (BID) | ORAL | Status: DC
  Administered 2020-06-08 – 2020-06-09 (×2): 25 mg via ORAL
  Filled 2020-06-08 (×2): qty 1

## 2020-06-08 MED ORDER — GADOBUTROL 1 MMOL/ML IV SOLN
10.0000 mL | Freq: Once | INTRAVENOUS | Status: AC | PRN
  Administered 2020-06-08: 10 mL via INTRAVENOUS

## 2020-06-08 MED ORDER — FUROSEMIDE 20 MG PO TABS
20.0000 mg | ORAL_TABLET | Freq: Every day | ORAL | Status: DC
  Administered 2020-06-08 – 2020-06-09 (×2): 20 mg via ORAL
  Filled 2020-06-08 (×2): qty 1

## 2020-06-08 MED ORDER — ACETAMINOPHEN 325 MG PO TABS: 650.0000 mg | ORAL_TABLET | ORAL | Status: DC | PRN

## 2020-06-08 MED ORDER — ACETAMINOPHEN 650 MG RE SUPP: 650.0000 mg | RECTAL | Status: DC | PRN

## 2020-06-08 NOTE — Progress Notes (Signed)
  Echocardiogram 2D Echocardiogram has been performed.  Amanda Mckay 06/08/2020, 11:45 AM

## 2020-06-08 NOTE — Consult Note (Signed)
NEURO HOSPITALIST CONSULT NOTE   Requestig physician: Dr. Karleen Hampshire  Reason for Consult: Acute onset of left facial weakness and loss of taste  History obtained from:   Patient and Chart    HPI:                                                                                                                                          Amanda Mckay is an 75 y.o. female with a PMHx of atrial fibrillation, arthritis, cataract and hemochromatosis who presented to the [facility] ED yesterday after experiencing acute onset of left facial weakness and loss of taste at home. She was seen by Teleneurology, with findings felt to be most consistent with a Bell's palsy. MRI brain was ordered. Treatment with prednisone and valacyclovir was recommended if MRI was negative for stroke. She was transferred to [facility] for further assessment.   Dr. Lyn Records Teleneurology note was reviewed: "Amanda Mckay is a 75 y.o. woman with a past medical history significant for atrial fibrillation not on anticoagulation, heart failure, arthritis, hemochromatosis, presenting with left-sided facial droop. She reports that this drip started at 10 AM and has been gradually progressive.  It has been associated with slight change in sensation of the face as well as a change in taste.  She additionally feels like she has reduced hearing in the left ear when she self tests via video with finger rub. Regarding her history of atrial fibrillation she reports that she was diagnosed about 15 years ago when she had such significant symptoms that 4-day hospitalization was needed to control her rate and symptoms.  However she has been managed on aspirin 325 mg.  She denies any significant bleeding history.  She reports she was offered Eliquis when she moved to this area 4 years ago but she elected to continue just the aspirin as it had been working well for her and she feels she is taking a large number of medications already.  She does continue to have  some intermittent brief palpitations but these are fairly infrequent. At baseline she does have some difficulty with her bilateral lower extremities due to arthritis and back pain."    Past Medical History:  Diagnosis Date  . Arthritis   . Atrial fibrillation ([place])   . Cataract   . Dysrhythmia    AFib  . GERD (gastroesophageal reflux disease)   . Hemochromatosis   . Iron excess     Past Surgical History:  Procedure Laterality Date  . CESAREAN SECTION    . KNEE ARTHROSCOPY WITH MEDIAL MENISECTOMY Right 01/28/2019   Procedure: RIGHT KNEE ARTHROSCOPY WITH MEDIAL MENISCECTOMY;  Surgeon: Carole Civil, MD;  Location: [facility];  Service: Orthopedics;  Laterality: Right;  . TONSILLECTOMY    . VENTRAL HERNIA REPAIR N/A  01/26/2018   Procedure: HERNIA REPAIR VENTRAL ADULT WITH MESH;  Surgeon: Virl Cagey, MD;  Location: [facility];  Service: General;  Laterality: N/A;    Family History  Problem Relation Age of Onset  . Stroke Mother   . Atrial fibrillation Son   . Stroke Maternal Grandmother   . Diabetes Paternal Grandfather   . Arthritis Brother               Social History:  reports that she has never smoked. She has never used smokeless tobacco. She reports current alcohol use of about 2.0 standard drinks of alcohol per week. She reports that she does not use drugs.  Allergies  Allergen Reactions  . Tetanus Toxoids Other (See Comments)    Reaction occurred more than 40 yrs ago--does not remember what occurred.    MEDICATIONS:                                                                                                                     Prior to Admission:  Medications Prior to Admission  Medication Sig Dispense Refill Last Dose  . acetaminophen (TYLENOL) 650 MG CR tablet Take 650-1,300 mg by mouth every 8 (eight) hours as needed for pain.   Past Week at Unknown time  . aspirin 325 MG tablet Take 325 mg by mouth every evening.    06/06/2020 at Unknown time  .  atorvastatin (LIPITOR) 10 MG tablet Take 1 tablet (10 mg total) by mouth 3 (three) times a week. At bedtime. 30 tablet 3 Past Week at Unknown time  . Calcium Carb-Cholecalciferol (CALCIUM 600+D3 PO) Take 1 tablet by mouth 2 (two) times daily.   06/07/2020 at Unknown time  . Cholecalciferol 25 MCG (1000 UT) capsule Take 1,000 Units by mouth daily.   06/07/2020 at Unknown time  . clotrimazole (LOTRIMIN AF) 1 % cream Apply 1 application topically 2 (two) times daily. (Patient taking differently: Apply 1 application topically 2 (two) times daily. Applied to toes twice daily for fungus) 30 g 1 06/07/2020 at Unknown time  . diclofenac (CATAFLAM) 50 MG tablet TAKE ONE TABLET (50MG  TOTAL) BY MOUTH TWO TIMES DAILY (Patient taking differently: Take 50 mg by mouth 2 (two) times daily.) 60 tablet 3 06/07/2020 at Unknown time  . furosemide (LASIX) 20 MG tablet TAKE ONE TABLET (20MG  TOTAL) BY MOUTH DAILY AS NEEDED (Patient taking differently: Take 20 mg by mouth daily.) 30 tablet 2 06/06/2020 at Unknown time  . magnesium oxide (MAG-OX) 400 MG tablet Take 400 mg by mouth daily.   06/06/2020 at Unknown time  . metoprolol tartrate (LOPRESSOR) 25 MG tablet Take 1 tablet (25 mg total) by mouth 2 (two) times daily. 180 tablet 0 06/07/2020 at 0930  . potassium chloride (KLOR-CON) 10 MEQ tablet Take 1 po daily with lasix for leg cramps 30 tablet 2 06/06/2020 at Unknown time  . trolamine salicylate (ASPERCREME) 10 % cream Apply 1 application topically 2 (two) times daily as needed for muscle pain.  06/06/2020 at Unknown time  . calcium carbonate (OSCAL) 1500 (600 Ca) MG TABS tablet  (Patient not taking: Reported on 06/07/2020)   Not Taking at Unknown time   Scheduled: . aspirin  325 mg Oral Daily  . atorvastatin  10 mg Oral Once per day on Mon Wed Fri  . enoxaparin (LOVENOX) injection  40 mg Subcutaneous Daily  . furosemide  20 mg Oral Daily  . potassium chloride  10 mEq Oral Daily  . predniSONE  60 mg Oral Q breakfast  .  valACYclovir  1,000 mg Oral TID     ROS:                                                                                                                                       BLE edema. Dry left eye. Had pain in left ear before onset of her left facial weakness. Has taste loss on the left side of her tongue. Other ROS as per HPI. The patient does not endorse additional symptoms.   Blood pressure 125/64, pulse 94, temperature 98.2 F (36.8 C), temperature source Oral, resp. rate 17, height 5\' 6"  (1.676 m), weight 111.1 kg, SpO2 97 %.   General Examination:                                                                                                       Physical Exam  HEENT-  [place]/AT. No vesicles seen in left EAC.    Lungs-Respirations unlabored Extremities- Pretibial pitting edema is noted.    Neurological Examination Mental Status: Awake and alert. Thought content appropriate.  Speech fluent without evidence of aphasia.  Able to follow all commands without difficulty. Cranial Nerves: II: Visual fields intact with no extinction to DSS. PERRL.   III,IV, VI: Able to fully open both eyes, but left palpebral fissure wider than on the right. EOMI.   V,VII: Left eyelid weakness - cannot fully closed. Delayed blinking on the left. Decreased brow furrowing on the left. Left perioral and cheek movement is decreased with prominent facial droop. Cheek puffing results in release of air from left corner of her mouth.  VIII: No hyperacusis noted.  IX,X: No hypophonia XI: Symmetric XII: Midline tongue extension Motor: Right : Upper extremity   5/5    Left:     Upper extremity   5/5  Lower extremity   5/5     Lower extremity   5/5 Sensory: Temp and light touch intact and symmetric Deep Tendon  Reflexes: Unremarkable Cerebellar: No ataxia with FNF bilaterally  Gait: Deferred   Lab Results: Basic Metabolic Panel: Recent Labs  Lab 06/07/20 1833 06/07/20 1837  NA 141 144  K 3.4* 3.6  CL 107  105  CO2 25  --   GLUCOSE 115* 113*  BUN 31* 32*  CREATININE 0.83 0.90  CALCIUM 9.2  --     CBC: Recent Labs  Lab 06/07/20 1833 06/07/20 1837  WBC 7.6  --   NEUTROABS 4.5  --   HGB 14.0 13.9  HCT 42.7 41.0  MCV 97.0  --   PLT 197  --     Cardiac Enzymes: No results for input(s): CKTOTAL, CKMB, CKMBINDEX, TROPONINI in the last 168 hours.  Lipid Panel: Recent Labs  Lab 06/08/20 0346  CHOL 165  TRIG 131  HDL 40*  CHOLHDL 4.1  VLDL 26  LDLCALC 99    Imaging: CT HEAD WO CONTRAST  Result Date: 06/07/2020 CLINICAL DATA:  Left-sided facial droop and weakness since 10 a.m. EXAM: CT HEAD WITHOUT CONTRAST TECHNIQUE: Contiguous axial images were obtained from the base of the skull through the vertex without intravenous contrast. COMPARISON:  None. FINDINGS: Brain: No acute infarct or hemorrhage. Lateral ventricles and midline structures are unremarkable. No acute extra-axial fluid collections. No mass effect. Vascular: No hyperdense vessel or unexpected calcification. Skull: Normal. Negative for fracture or focal lesion. Sinuses/Orbits: No acute finding. Other: None. IMPRESSION: 1. No acute intracranial process. Electronically Signed   By: Randa Ngo M.D.   On: 06/07/2020 19:16   MR BRAIN W WO CONTRAST  Result Date: 06/08/2020 CLINICAL DATA:  Facial asymmetry with acute stroke suspected. Concern for Bell's palsy on the left EXAM: MRI HEAD WITHOUT AND WITH CONTRAST TECHNIQUE: Multiplanar, multiecho pulse sequences of the brain and surrounding structures were obtained without and with intravenous contrast. CONTRAST:  73mL GADAVIST GADOBUTROL 1 MMOL/ML IV SOLN COMPARISON:  Head CT from yesterday FINDINGS: Brain: Punctate rounded focus of enhancement at the fundus of the left internal auditory canal, subtle but possible fundal tuft sign in the setting of suspected Bell's palsy on the left. No abnormal enhancement at the level of the temporal bone or stylomastoid fat pads. No infarct,  hemorrhage, hydrocephalus, or masslike finding. Normal brain volume and white matter appearance. Vascular: Normal flow voids. Skull and upper cervical spine: Normal marrow signal. Sinuses/Orbits: Retention cysts in the maxillary sinuses. Negative orbits. IMPRESSION: 1. Negative for infarct or mass. Normal appearance of the brain itself. 2. Subtle findings at the left internal auditory canal described with Bell's palsy. Electronically Signed   By: Monte Fantasia M.D.   On: 06/08/2020 06:08    Assessment: 75 year old female with acute onset of left facial weakness and loss of taste 1. Exam reveals findings most consistent with left sided Bell's palsy 2. MRI brain is negative for acute stroke. There is a punctate rounded focus of enhancement at the fundus of the left internal auditory canal, subtle but possible fundal tuft sign in the setting of suspected left Bell's palsy. No abnormal enhancement at the level of the temporal bone or stylomastoid fat pads.  Recommendations: 1. Treat with prednisone 60 mg daily for 1 week, then taper by 10 mg per day. Also treat with valacyclovir 1000 mg 3 times daily for 1 week. Patient has been advised to use eyedrops frequently in the affected eye and use a moisture chamber at night versus taping the left eyelid shut.  2. Will need outpatient neurology follow-up in  3 to 4 months to confirm symptoms are improving and no new problems have arisen. 3. Neurohospitalist service will sign off. Please call if there are additional questions.     Electronically signed: Dr. Kerney Elbe 06/08/2020, 9:11 AM

## 2020-06-08 NOTE — ED Notes (Signed)
Pt assisted to bathroom x1 assist. Gait steady, but slow. Pt appears to be in NAD.

## 2020-06-08 NOTE — Progress Notes (Signed)
Patient arrived in the unit at [facility] am from [facility] ED, escorted by [facility] staffs, A&Ox3, no s/s distress, denied of any acute pain, VSs are WNL, initiated telemery monitoring, all patient needed items are  Within reach, bed is locked and bed alarm is on, and will continue to monitor closely.

## 2020-06-08 NOTE — Progress Notes (Signed)
Nurse noticed patient's HR had been tachycardic around 116 BPM for about an hour. Notified Dr. Karleen Hampshire, and let her know the patient is normally on metoprolol at home for control of her Afib, but is not currently getting it in the hospital.

## 2020-06-08 NOTE — Progress Notes (Signed)
PROGRESS NOTE    Amanda Mckay  EVO:[id number] DOB: 08/24/1945 DOA: 06/07/2020 PCP: Alycia Rossetti, MD    Chief Complaint  Patient presents with   Facial Droop    Brief Narrative:  Amanda Mckay is a 75 y.o. female with medical history significant for paroxysmal atrial fibrillation, hypertension, diastolic CHF, restless leg syndrome, obesity, residual hemochromatosis presents to ED AT [facility] with complaints of left facial droop, decreased sensation to the left face. Tele neurology Consulted, concern for Bell's palsy, recommended MRI brain with and without contrast, to rule out stroke, and if normal MRI recommended starting prednisone 60 mg daily for 1 week and valacyclovir 1000 mg 3 times daily for 1 week. She was transferred to [facility] for further evaluation.   Assessment & Plan:   Principal Problem:   Facial droop Active Problems:   Hyperlipidemia   Hereditary hemochromatosis ([place])   Essential hypertension   Chronic diastolic HF (heart failure) ([facility])   Peripheral edema   Class 3 obesity   AF (paroxysmal atrial fibrillation) ([facility])  Left Facial droop with left sided facial numbness Likely from Bells' Palsy.  MRI brain negative for stroke.  Neurology consulted, recommended Treat with prednisone 60 mg daily for 1 week, then taper by 10 mg per day. Also treat with valacyclovir 1000 mg 3 times daily for 1 week. Patient has been advised to use eyedrops frequently in the affected eye and use a moisture chamber at night versus taping the left eyelid shut.    Bilateral leg edema:  Get venous duplex to rule out DVT.  Get echocardiogram for further evaluation.     PAF;  Currently sinus rhythm.  Not on anti coagulation.  Follows up with Dr Lorelee New Resume metoprolol ans aspirin 325 mg daily.  CHAD2 VASC2 Score is 4. She declined anti coagulation.     H/o chronic diastolic heart failure:  She appears to be compensated.     Hypertension:  Well controlled.    Hyperlipidemia:   Resume statin.    Hypokalemia: replaced.       DVT prophylaxis: Lovenox.  Code Status: full code.  Family Communication: (HUSBAND AT BEDSIDE. Disposition:   Status is: Observation  The patient will require care spanning > 2 midnights and should be moved to inpatient because: Ongoing diagnostic testing needed not appropriate for outpatient work up  Dispo: The patient is from: Home              Anticipated d/c is to: Home              Patient currently is not medically stable to d/c.   Difficult to place patient No       Consultants:   Neurology.    Procedures: NONE.    Antimicrobials: NONE.    Subjective: No chestp ain or sob. Knee pain from OA.   Objective: Vitals:   06/08/20 0319 06/08/20 0424 06/08/20 0624 06/08/20 0828  BP: (!) 124/59 127/64 120/76 125/64  Pulse: (!) 102 (!) 105 86 94  Resp: 17 18 16 17   Temp: (!) 97.5 F (36.4 C) 98.5 F (36.9 C) 98.2 F (36.8 C) 98.2 F (36.8 C)  TempSrc: Oral Oral Oral Oral  SpO2: 95% 99% 97% 97%  Weight:      Height:        Intake/Output Summary (Last 24 hours) at 06/08/2020 1009 Last data filed at 06/08/2020 0944 Gross per 24 hour  Intake 240 ml  Output 800 ml  Net -560 ml   Filed  Weights   06/07/20 1828  Weight: 111.1 kg    Examination:  General exam: Appears calm and comfortable  Respiratory system: Clear to auscultation. Respiratory effort normal. Cardiovascular system: S1 & S2 heard, RRR. No JVD, . No pedal edema. Gastrointestinal system: Abdomen is nondistended, soft and nontender.. Normal bowel sounds heard. Central nervous system: Alert and oriented. Left facial droop Extremities: leg edema present.  Skin: No rashes, lesions or ulcers Psychiatry:. Mood & affect appropriate.     Data Reviewed: I have personally reviewed following labs and imaging studies  CBC: Recent Labs  Lab 06/07/20 1833 06/07/20 1837  WBC 7.6  --   NEUTROABS 4.5  --   HGB 14.0 13.9  HCT 42.7 41.0  MCV 97.0   --   PLT 197  --     Basic Metabolic Panel: Recent Labs  Lab 06/07/20 1833 06/07/20 1837  NA 141 144  K 3.4* 3.6  CL 107 105  CO2 25  --   GLUCOSE 115* 113*  BUN 31* 32*  CREATININE 0.83 0.90  CALCIUM 9.2  --     GFR: Estimated Creatinine Clearance: 69.3 mL/min (by C-G formula based on SCr of 0.9 mg/dL).  Liver Function Tests: Recent Labs  Lab 06/07/20 1833  AST 20  ALT 20  ALKPHOS 72  BILITOT 0.4  PROT 7.0  ALBUMIN 4.0    CBG: Recent Labs  Lab 06/07/20 1819  GLUCAP 115*     Recent Results (from the past 240 hour(s))  Resp Panel by RT-PCR (Flu A&B, Covid) Nasopharyngeal Swab     Status: None   Collection Time: 06/07/20  7:13 PM   Specimen: Nasopharyngeal Swab; Nasopharyngeal(NP) swabs in vial transport medium  Result Value Ref Range Status   SARS Coronavirus 2 by RT PCR NEGATIVE NEGATIVE Final    Comment: (NOTE) SARS-CoV-2 target nucleic acids are NOT DETECTED.  The SARS-CoV-2 RNA is generally detectable in upper respiratory specimens during the acute phase of infection. The lowest concentration of SARS-CoV-2 viral copies this assay can detect is 138 copies/mL. A negative result does not preclude SARS-Cov-2 infection and should not be used as the sole basis for treatment or other patient management decisions. A negative result may occur with  improper specimen collection/handling, submission of specimen other than nasopharyngeal swab, presence of viral mutation(s) within the areas targeted by this assay, and inadequate number of viral copies(<138 copies/mL). A negative result must be combined with clinical observations, patient history, and epidemiological information. The expected result is Negative.  Fact Sheet for Patients:  EntrepreneurPulse.com.au  Fact Sheet for Healthcare Providers:  IncredibleEmployment.be  This test is no t yet approved or cleared by the [place] FDA and  has been authorized for  detection and/or diagnosis of SARS-CoV-2 by FDA under an Emergency Use Authorization (EUA). This EUA will remain  in effect (meaning this test can be used) for the duration of the COVID-19 declaration under Section 564(b)(1) of the Act, 21 U.S.C.section 360bbb-3(b)(1), unless the authorization is terminated  or revoked sooner.       Influenza A by PCR NEGATIVE NEGATIVE Final   Influenza B by PCR NEGATIVE NEGATIVE Final    Comment: (NOTE) The Xpert Xpress SARS-CoV-2/FLU/RSV plus assay is intended as an aid in the diagnosis of influenza from Nasopharyngeal swab specimens and should not be used as a sole basis for treatment. Nasal washings and aspirates are unacceptable for Xpert Xpress SARS-CoV-2/FLU/RSV testing.  Fact Sheet for Patients: EntrepreneurPulse.com.au  Fact Sheet for Healthcare Providers: IncredibleEmployment.be  This test is not yet approved or cleared by the [place] and has been authorized for detection and/or diagnosis of SARS-CoV-2 by FDA under an Emergency Use Authorization (EUA). This EUA will remain in effect (meaning this test can be used) for the duration of the COVID-19 declaration under Section 564(b)(1) of the Act, 21 U.S.C. section 360bbb-3(b)(1), unless the authorization is terminated or revoked.  Performed at [facility], [address] [place], [place] [address]          Radiology Studies: CT HEAD WO CONTRAST  Result Date: 06/07/2020 CLINICAL DATA:  Left-sided facial droop and weakness since 10 a.m. EXAM: CT HEAD WITHOUT CONTRAST TECHNIQUE: Contiguous axial images were obtained from the base of the skull through the vertex without intravenous contrast. COMPARISON:  None. FINDINGS: Brain: No acute infarct or hemorrhage. Lateral ventricles and midline structures are unremarkable. No acute extra-axial fluid collections. No mass effect. Vascular: No hyperdense vessel or unexpected calcification. Skull:  Normal. Negative for fracture or focal lesion. Sinuses/Orbits: No acute finding. Other: None. IMPRESSION: 1. No acute intracranial process. Electronically Signed   By: Randa Ngo M.D.   On: 06/07/2020 19:16   MR BRAIN W WO CONTRAST  Result Date: 06/08/2020 CLINICAL DATA:  Facial asymmetry with acute stroke suspected. Concern for Bell's palsy on the left EXAM: MRI HEAD WITHOUT AND WITH CONTRAST TECHNIQUE: Multiplanar, multiecho pulse sequences of the brain and surrounding structures were obtained without and with intravenous contrast. CONTRAST:  74mL GADAVIST GADOBUTROL 1 MMOL/ML IV SOLN COMPARISON:  Head CT from yesterday FINDINGS: Brain: Punctate rounded focus of enhancement at the fundus of the left internal auditory canal, subtle but possible fundal tuft sign in the setting of suspected Bell's palsy on the left. No abnormal enhancement at the level of the temporal bone or stylomastoid fat pads. No infarct, hemorrhage, hydrocephalus, or masslike finding. Normal brain volume and white matter appearance. Vascular: Normal flow voids. Skull and upper cervical spine: Normal marrow signal. Sinuses/Orbits: Retention cysts in the maxillary sinuses. Negative orbits. IMPRESSION: 1. Negative for infarct or mass. Normal appearance of the brain itself. 2. Subtle findings at the left internal auditory canal described with Bell's palsy. Electronically Signed   By: Monte Fantasia M.D.   On: 06/08/2020 06:08        Scheduled Meds:  aspirin  325 mg Oral Daily   atorvastatin  10 mg Oral Once per day on Mon Wed Fri   enoxaparin (LOVENOX) injection  40 mg Subcutaneous Daily   furosemide  20 mg Oral Daily   potassium chloride  10 mEq Oral Daily   predniSONE  60 mg Oral Q breakfast   valACYclovir  1,000 mg Oral TID   Continuous Infusions:   LOS: 0 days    Time spent: 34 minutes.     Hosie Poisson, MD [facility]   To contact the attending provider between 7A-7P or the covering provider  during after hours 7P-7A, please log into the web site [email or web address] and access using universal [place] password for that web site. If you do not have the password, please call the hospital operator.  06/08/2020, 10:09 AM

## 2020-06-08 NOTE — Plan of Care (Signed)

## 2020-06-08 NOTE — ED Notes (Signed)
Pt being transported off floor at this time.

## 2020-06-09 ENCOUNTER — Observation Stay (HOSPITAL_BASED_OUTPATIENT_CLINIC_OR_DEPARTMENT_OTHER): Payer: Medicare PPO

## 2020-06-09 DIAGNOSIS — I5032 Chronic diastolic (congestive) heart failure: Secondary | ICD-10-CM | POA: Diagnosis not present

## 2020-06-09 DIAGNOSIS — I10 Essential (primary) hypertension: Secondary | ICD-10-CM | POA: Diagnosis not present

## 2020-06-09 DIAGNOSIS — R609 Edema, unspecified: Secondary | ICD-10-CM

## 2020-06-09 DIAGNOSIS — I480 Paroxysmal atrial fibrillation: Secondary | ICD-10-CM | POA: Diagnosis not present

## 2020-06-09 MED ORDER — PANTOPRAZOLE SODIUM 40 MG PO TBEC: 40.0000 mg | DELAYED_RELEASE_TABLET | Freq: Every day | ORAL | 0 refills | Status: DC

## 2020-06-09 MED ORDER — PREDNISONE 20 MG PO TABS: ORAL_TABLET | ORAL | 0 refills | Status: DC

## 2020-06-09 MED ORDER — VALACYCLOVIR HCL 1 G PO TABS: 1000.0000 mg | ORAL_TABLET | Freq: Three times a day (TID) | ORAL | 0 refills | Status: AC

## 2020-06-09 NOTE — TOC Transition Note (Signed)
Transition of Care [facility]) - CM/SW Discharge Note   Patient Details  Name: Amanda Mckay MRN: [id number] Date of Birth: July 10, 1945  [facility] CM/SW Contact:  Pollie Friar, RN Phone Number: 06/09/2020, 3:26 PM   Clinical Narrative:    Patient is discharging home with self care. Pt has support at home and transportation to home.    Final next level of care: Home/Self Care Barriers to Discharge: No Barriers Identified   Patient Goals and CMS Choice        Discharge Placement                       Discharge Plan and Services                                     Social Determinants of Health (SDOH) Interventions     Readmission Risk Interventions No flowsheet data found.

## 2020-06-09 NOTE — Progress Notes (Signed)
Lower extremity venous has been completed.   Preliminary results in CV Proc.   Abram Sander 06/09/2020 2:24 PM

## 2020-06-09 NOTE — Care Management Obs Status (Signed)
[place] NOTIFICATION   Patient Details  Name: Amanda Mckay MRN: [id number] Date of Birth: [birth year]   Medicare Observation Status Notification Given:  Yes    Pollie Friar, RN 06/09/2020, 3:23 PM

## 2020-06-10 NOTE — Discharge Summary (Signed)
Physician Discharge Summary  Amanda Mckay WOE:[id number] DOB: 1945/08/08 DOA: 06/07/2020  PCP: Alycia Rossetti, MD  Admit date: 06/07/2020 Discharge date: 06/09/2020  Admitted From: Home.  Disposition:  Home   Recommendations for Outpatient Follow-up:  1. Follow up with PCP in 1-2 weeks 2. Please obtain BMP/CBC in one week Please follow up with neurology in 4 weeks.   Discharge Condition:stable.  CODE STATUS: FULL CODE.  Diet recommendation: Heart Healthy   Brief/Interim Summary: Amanda Mckay a 75 y.o.femalewith medical history significant forparoxysmal atrial fibrillation, hypertension, diastolic CHF, restless leg syndrome, obesity,residual hemochromatosis presents to ED [facility] with complaints of left facial droop, decreased sensation to the left face. Tele neurologyConsulted, concernfor Bell's palsy, recommended MRI brain with and without contrast, to rule out stroke, and if normal MRI recommended starting prednisone 60 mg daily for 1 week and valacyclovir 1000 mg 3 times daily for 1 week. She was transferred to [facility] for further evaluation.   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  MRI brain negative for stroke.  Neurology consulted, recommended Treat with prednisone 60 mg daily for 1 week, then taper by 10 mg per day. Also treat withvalacyclovir 1000 mg 3 times daily for 1 week. Patienthas beenadvised to use eyedrops frequently in the affected eye anduse a moisture chamber at night versus taping the left eyelid shut.    Bilateral leg edema:  Venous duplex is negative for DVT.       PAF;  Currently sinus rhythm.  Not on anti coagulation.  Follows  up with Dr Lorelee New Resume metoprolol ans aspirin 325 mg daily.  CHAD2 VASC2 Score is 4. She declined anti coagulation.     H/o chronic diastolic heart failure:  She appears to be compensated.     Hypertension:  Well controlled.    Hyperlipidemia:  Resume statin.    Hypokalemia: replaced.      Discharge Instructions  Discharge Instructions    Diet - low sodium heart healthy   Complete by: As directed    Increase activity slowly   Complete by: As directed      Allergies as of 06/09/2020      Reactions   Tetanus Toxoids Other (See Comments)   Reaction occurred more than 40 yrs ago--does not remember what occurred.      Medication List    TAKE these medications   acetaminophen 650 MG CR tablet Commonly known as: TYLENOL Take 650-1,300 mg by mouth every 8 (eight) hours as needed for pain.   aspirin 325 MG tablet Take 325 mg by mouth every evening.   atorvastatin 10 MG tablet Commonly known as: LIPITOR Take 1 tablet (10 mg total) by mouth 3 (three) times a week. At bedtime.   CALCIUM 600+D3 PO Take 1 tablet by mouth 2 (two) times daily.   calcium carbonate 1500 (600 Ca) MG Tabs tablet Commonly known as: OSCAL   Cholecalciferol 25 MCG (1000 UT) capsule Take 1,000 Units by mouth daily.   clotrimazole 1 % cream Commonly known as: Lotrimin AF Apply 1 application topically 2 (two) times daily. What changed: additional instructions   diclofenac 50 MG tablet Commonly known as: CATAFLAM TAKE ONE TABLET (50MG  TOTAL) BY  MOUTH TWO TIMES DAILY What changed: See the new instructions.   furosemide 20 MG tablet Commonly known as: LASIX TAKE ONE TABLET (20MG  TOTAL) BY MOUTH DAILY AS NEEDED What changed: See the new instructions.   magnesium oxide 400 MG tablet Commonly known as: MAG-OX Take 400 mg by mouth daily.   metoprolol tartrate 25 MG tablet Commonly known as: LOPRESSOR Take 1 tablet (25 mg total) by mouth 2 (two) times daily.    pantoprazole 40 MG tablet Commonly known as: PROTONIX Take 1 tablet (40 mg total) by mouth daily at 6 (six) AM for 14 days.   potassium chloride 10 MEQ tablet Commonly known as: KLOR-CON Take 1 po daily with lasix for leg cramps   predniSONE 20 MG tablet Commonly known as: DELTASONE Prednisone 60 mg daily for 5 days followed by  Prednisone 50 mg daily for 1 day followed by  Prednisone 40 mg daily for 1 day followed by  Prednisone 30 mg daily for 1 day followed by  Prednisone 20 mg daily for 1 day followed by  Prednisone 10 mg daily for 1 day and stop   trolamine salicylate 10 % cream Commonly known as: ASPERCREME Apply 1 application topically 2 (two) times daily as needed for muscle pain.   valACYclovir 1000 MG tablet Commonly known as: VALTREX Take 1 tablet (1,000 mg total) by mouth 3 (three) times daily for 6 days.       Allergies  Allergen Reactions  . Tetanus Toxoids Other (See Comments)    Reaction occurred more than 40 yrs ago--does not remember what occurred.    Consultations:  Neurology.    Procedures/Studies: CT HEAD WO CONTRAST  Result Date: 06/07/2020 CLINICAL DATA:  Left-sided facial droop and weakness since 10 a.m. EXAM: CT HEAD WITHOUT CONTRAST TECHNIQUE: Contiguous axial images were obtained from the base of the skull through the vertex without intravenous contrast. COMPARISON:  None. FINDINGS: Brain: No acute infarct or hemorrhage. Lateral ventricles and midline structures are unremarkable. No acute extra-axial fluid collections. No mass effect. Vascular: No hyperdense vessel or unexpected calcification. Skull: Normal. Negative for fracture or focal lesion. Sinuses/Orbits: No acute finding. Other: None. IMPRESSION: 1. No acute intracranial process. Electronically Signed   By: Randa Ngo M.D.   On: 06/07/2020 19:16   MR BRAIN W WO CONTRAST  Result Date: 06/08/2020 CLINICAL DATA:  Facial asymmetry with acute stroke suspected. Concern for Bell's palsy  on the left EXAM: MRI HEAD WITHOUT AND WITH CONTRAST TECHNIQUE: Multiplanar, multiecho pulse sequences of the brain and surrounding structures were obtained without and with intravenous contrast. CONTRAST:  29mL GADAVIST GADOBUTROL 1 MMOL/ML IV SOLN COMPARISON:  Head CT from yesterday FINDINGS: Brain: Punctate rounded focus of enhancement at the fundus of the left internal auditory canal, subtle but possible fundal tuft sign in the setting of suspected Bell's palsy on the left. No abnormal enhancement at the level of the temporal bone or stylomastoid fat pads. No infarct, hemorrhage, hydrocephalus, or masslike finding. Normal brain volume and white matter appearance. Vascular: Normal flow voids. Skull and upper cervical spine: Normal marrow signal. Sinuses/Orbits: Retention cysts in the maxillary sinuses. Negative orbits. IMPRESSION: 1. Negative for infarct or mass. Normal appearance of the brain itself. 2. Subtle findings at the left internal auditory canal described with Bell's palsy. Electronically Signed   By: Monte Fantasia M.D.   On: 06/08/2020 06:08   ECHOCARDIOGRAM COMPLETE  Result Date: 06/08/2020    ECHOCARDIOGRAM REPORT   Patient Name:   [facility]  Date of Exam: 06/08/2020 Medical Rec #:  [id number]   Height:       66.0 in Accession #:    [id number]  Weight:       245.0 lb Date of Birth:  1946-01-07   BSA:          2.180 m Patient Age:    89 years    BP:           125/64 mmHg Patient Gender: F           HR:           98 bpm. Exam Location:  Inpatient Procedure: 2D Echo, 3D Echo, Cardiac Doppler and Color Doppler Indications:    I48.91* Unspeicified atrial fibrillation  History:        Patient has no prior history of Echocardiogram examinations.                 Abnormal ECG, Arrythmias:Atrial Fibrillation; Risk                 Factors:Hypertension and Dyslipidemia.  Sonographer:    Roseanna Rainbow RDCS Referring Phys: [id number]  Sonographer Comments: Technically difficult study due to poor echo  windows and patient is morbidly obese. Image acquisition challenging due to patient body habitus. IMPRESSIONS  1. Left ventricular ejection fraction, by estimation, is 65 to 70%. The left ventricle has normal function. The left ventricle has no regional wall motion abnormalities. There is mild concentric left ventricular hypertrophy. Left ventricular diastolic parameters are consistent with Grade I diastolic dysfunction (impaired relaxation).  2. Right ventricular systolic function is normal. The right ventricular size is normal. There is normal pulmonary artery systolic pressure.  3. The mitral valve is normal in structure. Trivial mitral valve regurgitation. No evidence of mitral stenosis.  4. The aortic valve is normal in structure. Aortic valve regurgitation is not visualized. No aortic stenosis is present.  5. The inferior vena cava is normal in size with greater than 50% respiratory variability, suggesting right atrial pressure of 3 mmHg. FINDINGS  Left Ventricle: Left ventricular ejection fraction, by estimation, is 65 to 70%. The left ventricle has normal function. The left ventricle has no regional wall motion abnormalities. The left ventricular internal cavity size was normal in size. There is  mild concentric left ventricular hypertrophy. Left ventricular diastolic parameters are consistent with Grade I diastolic dysfunction (impaired relaxation). Normal left ventricular filling pressure. Right Ventricle: The right ventricular size is normal. No increase in right ventricular wall thickness. Right ventricular systolic function is normal. There is normal pulmonary artery systolic pressure. Left Atrium: Left atrial size was normal in size. Right Atrium: Right atrial size was normal in size. Pericardium: There is no evidence of pericardial effusion. Mitral Valve: The mitral valve is normal in structure. There is mild thickening of the mitral valve leaflet(s). There is mild calcification of the mitral valve  leaflet(s). Trivial mitral valve regurgitation. No evidence of mitral valve stenosis. Tricuspid Valve: The tricuspid valve is normal in structure. Tricuspid valve regurgitation is trivial. No evidence of tricuspid stenosis. Aortic Valve: The aortic valve is normal in structure. Aortic valve regurgitation is not visualized. No aortic stenosis is present. Pulmonic Valve: The pulmonic valve was normal in structure. Pulmonic valve regurgitation is not visualized. No evidence of pulmonic stenosis. Aorta: The aortic root is normal in size and structure. Venous: The inferior vena cava is normal in size with greater than 50% respiratory variability, suggesting right atrial pressure of 3 mmHg. IAS/Shunts:  No atrial level shunt detected by color flow Doppler.  LEFT VENTRICLE PLAX 2D LVIDd:         4.00 cm     Diastology LVIDs:         2.50 cm     LV e' medial:    5.22 cm/s LV PW:         1.10 cm     LV E/e' medial:  16.5 LV IVS:        1.20 cm     LV e' lateral:   9.36 cm/s LVOT diam:     1.80 cm     LV E/e' lateral: 9.2 LV SV:         58 LV SV Index:   27 LVOT Area:     2.54 cm  LV Volumes (MOD) LV vol d, MOD A2C: 30.0 ml LV vol d, MOD A4C: 59.8 ml LV vol s, MOD A2C: 9.4 ml LV vol s, MOD A4C: 19.3 ml LV SV MOD A2C:     20.6 ml LV SV MOD A4C:     59.8 ml LV SV MOD BP:      30.0 ml RIGHT VENTRICLE             IVC RV S prime:     15.90 cm/s  IVC diam: 1.90 cm TAPSE (M-mode): 2.4 cm LEFT ATRIUM             Index       RIGHT ATRIUM           Index LA diam:        3.70 cm 1.70 cm/m  RA Area:     13.40 cm LA Vol (A2C):   15.8 ml 7.25 ml/m  RA Volume:   32.80 ml  15.05 ml/m LA Vol (A4C):   26.3 ml 12.06 ml/m LA Biplane Vol: 20.3 ml 9.31 ml/m  AORTIC VALVE LVOT Vmax:   127.00 cm/s LVOT Vmean:  82.500 cm/s LVOT VTI:    0.228 m  AORTA Ao Root diam: 3.20 cm Ao Asc diam:  3.00 cm MITRAL VALVE MV Area (PHT): 3.60 cm     SHUNTS MV Decel Time: 211 msec     Systemic VTI:  0.23 m MV E velocity: 86.30 cm/s   Systemic Diam: 1.80 cm MV A  velocity: 130.00 cm/s MV E/A ratio:  0.66 Ena Dawley MD Electronically signed by Ena Dawley MD Signature Date/Time: 06/08/2020/2:19:30 PM    Final    VAS [place] LOWER EXTREMITY VENOUS (DVT)  Result Date: 06/09/2020  Lower Venous DVT Study Indications: Edema.  Comparison Study: no prior Performing Technologist: Abram Sander RVS  Examination Guidelines: A complete evaluation includes B-mode imaging, spectral Doppler, color Doppler, and power Doppler as needed of all accessible portions of each vessel. Bilateral testing is considered an integral part of a complete examination. Limited examinations for reoccurring indications may be performed as noted. The reflux portion of the exam is performed with the patient in reverse Trendelenburg.  +---------+---------------+---------+-----------+----------+--------------+ RIGHT    CompressibilityPhasicitySpontaneityPropertiesThrombus Aging +---------+---------------+---------+-----------+----------+--------------+ CFV      Full           Yes      Yes                                 +---------+---------------+---------+-----------+----------+--------------+ SFJ      Full                                                        +---------+---------------+---------+-----------+----------+--------------+  FV Prox  Full                                                        +---------+---------------+---------+-----------+----------+--------------+ FV Mid   Full                                                        +---------+---------------+---------+-----------+----------+--------------+ FV DistalFull                                                        +---------+---------------+---------+-----------+----------+--------------+ PFV      Full                                                        +---------+---------------+---------+-----------+----------+--------------+ POP      Full           Yes      Yes                                  +---------+---------------+---------+-----------+----------+--------------+ PTV      Full                                                        +---------+---------------+---------+-----------+----------+--------------+ PERO     Full                                                        +---------+---------------+---------+-----------+----------+--------------+   +---------+---------------+---------+-----------+----------+--------------+ LEFT     CompressibilityPhasicitySpontaneityPropertiesThrombus Aging +---------+---------------+---------+-----------+----------+--------------+ CFV      Full           Yes      Yes                                 +---------+---------------+---------+-----------+----------+--------------+ SFJ      Full                                                        +---------+---------------+---------+-----------+----------+--------------+ FV Prox  Full                                                        +---------+---------------+---------+-----------+----------+--------------+  FV Mid   Full                                                        +---------+---------------+---------+-----------+----------+--------------+ FV DistalFull                                                        +---------+---------------+---------+-----------+----------+--------------+ PFV      Full                                                        +---------+---------------+---------+-----------+----------+--------------+ POP      Full           Yes      Yes                                 +---------+---------------+---------+-----------+----------+--------------+ PTV      Full                                                        +---------+---------------+---------+-----------+----------+--------------+ PERO     Full                                                         +---------+---------------+---------+-----------+----------+--------------+     Summary: BILATERAL: - No evidence of deep vein thrombosis seen in the lower extremities, bilaterally. - No evidence of superficial venous thrombosis in the lower extremities, bilaterally. -No evidence of popliteal cyst, bilaterally.   *See table(s) above for measurements and observations. Electronically signed by Amanda Haring on 06/09/2020 at 6:56:01 PM.    Final        Subjective: No new complaints.   Discharge Exam: Vitals:   06/09/20 0700 06/09/20 1137  BP: (!) 141/62 (!) 149/78  Pulse: 97 82  Resp: 16 18  Temp: 97.6 F (36.4 C) 98.9 F (37.2 C)  SpO2: 98% 95%   Vitals:   06/09/20 0000 06/09/20 0400 06/09/20 0700 06/09/20 1137  BP: 139/66 130/62 (!) 141/62 (!) 149/78  Pulse: 92 71 97 82  Resp: 18 16 16 18   Temp: 98.4 F (36.9 C) 98.2 F (36.8 C) 97.6 F (36.4 C) 98.9 F (37.2 C)  TempSrc: Oral Oral Oral Oral  SpO2: 99% 97% 98% 95%  Weight:      Height:        General: Pt is alert, awake, not in acute distress Cardiovascular: RRR, S1/S2 +, no rubs, no gallops Respiratory: CTA bilaterally, no wheezing, no rhonchi Abdominal: Soft, NT, ND, bowel sounds + Extremities: no edema, no cyanosis    The results of significant diagnostics from this hospitalization (  including imaging, microbiology, ancillary and laboratory) are listed below for reference.     Microbiology: Recent Results (from the past 240 hour(s))  Resp Panel by RT-PCR (Flu A&B, Covid) Nasopharyngeal Swab     Status: None   Collection Time: 06/07/20  7:13 PM   Specimen: Nasopharyngeal Swab; Nasopharyngeal(NP) swabs in vial transport medium  Result Value Ref Range Status   SARS Coronavirus 2 by RT PCR NEGATIVE NEGATIVE Final    Comment: (NOTE) SARS-CoV-2 target nucleic acids are NOT DETECTED.  The SARS-CoV-2 RNA is generally detectable in upper respiratory specimens during the acute phase of infection. The  lowest concentration of SARS-CoV-2 viral copies this assay can detect is 138 copies/mL. A negative result does not preclude SARS-Cov-2 infection and should not be used as the sole basis for treatment or other patient management decisions. A negative result may occur with  improper specimen collection/handling, submission of specimen other than nasopharyngeal swab, presence of viral mutation(s) within the areas targeted by this assay, and inadequate number of viral copies(<138 copies/mL). A negative result must be combined with clinical observations, patient history, and epidemiological information. The expected result is Negative.  Fact Sheet for Patients:  EntrepreneurPulse.com.au  Fact Sheet for Healthcare Providers:  IncredibleEmployment.be  This test is no t yet approved or cleared by the [place] FDA and  has been authorized for detection and/or diagnosis of SARS-CoV-2 by FDA under an Emergency Use Authorization (EUA). This EUA will remain  in effect (meaning this test can be used) for the duration of the COVID-19 declaration under Section 564(b)(1) of the Act, 21 U.S.C.section 360bbb-3(b)(1), unless the authorization is terminated  or revoked sooner.       Influenza A by PCR NEGATIVE NEGATIVE Final   Influenza B by PCR NEGATIVE NEGATIVE Final    Comment: (NOTE) The Xpert Xpress SARS-CoV-2/FLU/RSV plus assay is intended as an aid in the diagnosis of influenza from Nasopharyngeal swab specimens and should not be used as a sole basis for treatment. Nasal washings and aspirates are unacceptable for Xpert Xpress SARS-CoV-2/FLU/RSV testing.  Fact Sheet for Patients: EntrepreneurPulse.com.au  Fact Sheet for Healthcare Providers: IncredibleEmployment.be  This test is not yet approved or cleared by the [place] FDA and has been authorized for detection and/or diagnosis of SARS-CoV-2 by FDA under  an Emergency Use Authorization (EUA). This EUA will remain in effect (meaning this test can be used) for the duration of the COVID-19 declaration under Section 564(b)(1) of the Act, 21 U.S.C. section 360bbb-3(b)(1), unless the authorization is terminated or revoked.  Performed at [facility], [address] [place], [place] [address]      Labs: BNP (last 3 results) No results for input(s): BNP in the last 8760 hours. Basic Metabolic Panel: Recent Labs  Lab 06/07/20 1833 06/07/20 1837  NA 141 144  K 3.4* 3.6  CL 107 105  CO2 25  --   GLUCOSE 115* 113*  BUN 31* 32*  CREATININE 0.83 0.90  CALCIUM 9.2  --    Liver Function Tests: Recent Labs  Lab 06/07/20 1833  AST 20  ALT 20  ALKPHOS 72  BILITOT 0.4  PROT 7.0  ALBUMIN 4.0   No results for input(s): LIPASE, AMYLASE in the last 168 hours. No results for input(s): AMMONIA in the last 168 hours. CBC: Recent Labs  Lab 06/07/20 1833 06/07/20 1837  WBC 7.6  --   NEUTROABS 4.5  --   HGB 14.0 13.9  HCT 42.7 41.0  MCV 97.0  --  PLT 197  --    Cardiac Enzymes: No results for input(s): CKTOTAL, CKMB, CKMBINDEX, TROPONINI in the last 168 hours. BNP: Invalid input(s): POCBNP CBG: Recent Labs  Lab 06/07/20 1819  GLUCAP 115*   D-Dimer No results for input(s): DDIMER in the last 72 hours. Hgb A1c Recent Labs    06/08/20 0346  HGBA1C 5.4   Lipid Profile Recent Labs    06/08/20 0346  CHOL 165  HDL 40*  LDLCALC 99  TRIG 131  CHOLHDL 4.1   Thyroid function studies No results for input(s): TSH, T4TOTAL, T3FREE, THYROIDAB in the last 72 hours.  Invalid input(s): FREET3 Anemia work up No results for input(s): VITAMINB12, FOLATE, FERRITIN, TIBC, IRON, RETICCTPCT in the last 72 hours. Urinalysis    Component Value Date/Time   COLORURINE STRAW (A) 06/07/2020 1943   APPEARANCEUR CLEAR 06/07/2020 1943   LABSPEC 1.015 06/07/2020 [place] 6.0 06/07/2020 1943   GLUCOSEU NEGATIVE 06/07/2020 [place] 06/07/2020 [place] 06/07/2020 [place] 06/07/2020 1943   PROTEINUR NEGATIVE 06/07/2020 1943   NITRITE NEGATIVE 06/07/2020 1943   LEUKOCYTESUR NEGATIVE 06/07/2020 1943   Sepsis Labs Invalid input(s): PROCALCITONIN,  WBC,  LACTICIDVEN Microbiology Recent Results (from the past 240 hour(s))  Resp Panel by RT-PCR (Flu A&B, Covid) Nasopharyngeal Swab     Status: None   Collection Time: 06/07/20  7:13 PM   Specimen: Nasopharyngeal Swab; Nasopharyngeal(NP) swabs in vial transport medium  Result Value Ref Range Status   SARS Coronavirus 2 by RT PCR NEGATIVE NEGATIVE Final    Comment: (NOTE) SARS-CoV-2 target nucleic acids are NOT DETECTED.  The SARS-CoV-2 RNA is generally detectable in upper respiratory specimens during the acute phase of infection. The lowest concentration of SARS-CoV-2 viral copies this assay can detect is 138 copies/mL. A negative result does not preclude SARS-Cov-2 infection and should not be used as the sole basis for treatment or other patient management decisions. A negative result may occur with  improper specimen collection/handling, submission of specimen other than nasopharyngeal swab, presence of viral mutation(s) within the areas targeted by this assay, and inadequate number of viral copies(<138 copies/mL). A negative result must be combined with clinical observations, patient history, and epidemiological information. The expected result is Negative.  Fact Sheet for Patients:  EntrepreneurPulse.com.au  Fact Sheet for Healthcare Providers:  IncredibleEmployment.be  This test is no t yet approved or cleared by the [place] FDA and  has been authorized for detection and/or diagnosis of SARS-CoV-2 by FDA under an Emergency Use Authorization (EUA). This EUA will remain  in effect (meaning this test can be used) for the duration of the COVID-19 declaration under  Section 564(b)(1) of the Act, 21 U.S.C.section 360bbb-3(b)(1), unless the authorization is terminated  or revoked sooner.       Influenza A by PCR NEGATIVE NEGATIVE Final   Influenza B by PCR NEGATIVE NEGATIVE Final    Comment: (NOTE) The Xpert Xpress SARS-CoV-2/FLU/RSV plus assay is intended as an aid in the diagnosis of influenza from Nasopharyngeal swab specimens and should not be used as a sole basis for treatment. Nasal washings and aspirates are unacceptable for Xpert Xpress SARS-CoV-2/FLU/RSV testing.  Fact Sheet for Patients: EntrepreneurPulse.com.au  Fact Sheet for Healthcare Providers: IncredibleEmployment.be  This test is not yet approved or cleared by the [place] FDA and has been authorized for detection and/or diagnosis of SARS-CoV-2 by FDA under an Emergency Use Authorization (EUA). This EUA will remain  in effect (meaning this test can be used) for the duration of the COVID-19 declaration under Section 564(b)(1) of the Act, 21 U.S.C. section 360bbb-3(b)(1), unless the authorization is terminated or revoked.  Performed at [facility], [address] [place], [place] [address]      Time coordinating discharge:33 minutes.   SIGNED:   Hosie Poisson, MD  [facility] 06/10/2020, 5:40 PM

## 2020-06-15 ENCOUNTER — Other Ambulatory Visit: Payer: Self-pay | Admitting: Family Medicine

## 2020-06-28 ENCOUNTER — Other Ambulatory Visit: Payer: Self-pay

## 2020-06-28 ENCOUNTER — Ambulatory Visit: Payer: Medicare PPO | Admitting: Student

## 2020-06-28 ENCOUNTER — Encounter: Payer: Self-pay | Admitting: Student

## 2020-06-28 VITALS — BP 126/52 | HR 98 | Ht 66.0 in | Wt 234.8 lb

## 2020-06-28 DIAGNOSIS — I480 Paroxysmal atrial fibrillation: Secondary | ICD-10-CM

## 2020-06-28 DIAGNOSIS — E782 Mixed hyperlipidemia: Secondary | ICD-10-CM

## 2020-06-28 DIAGNOSIS — G510 Bell's palsy: Secondary | ICD-10-CM | POA: Diagnosis not present

## 2020-06-28 DIAGNOSIS — R600 Localized edema: Secondary | ICD-10-CM | POA: Diagnosis not present

## 2020-06-28 MED ORDER — METOPROLOL TARTRATE 25 MG PO TABS: 25.0000 mg | ORAL_TABLET | Freq: Two times a day (BID) | ORAL | 3 refills | Status: DC

## 2020-06-28 MED ORDER — ASPIRIN EC 81 MG PO TBEC: 81.0000 mg | DELAYED_RELEASE_TABLET | Freq: Every day | ORAL | 3 refills | Status: DC

## 2020-06-28 NOTE — Progress Notes (Signed)
Cardiology Office Note    Date:  06/28/2020   ID:  Amanda Mckay [place], [place] [birth year], MRN [id number]  PCP:  Alycia Rossetti, MD  Cardiologist: Kate Sable, MD (Inactive)  --> Needs to switch to new MD  Chief Complaint  Patient presents with  . Follow-up    Annual Visit    History of Present Illness:    Amanda Mckay is a 75 y.o. female with past medical history of paroxysmal atrial fibrillation, GERD and hemochromatosis who presents to the office today for annual follow-up.  She was last examined by Dr. Bronson Ing in 04/2019 and denied any recent chest pain or palpitations. She did report some issues with lower extremity edema and had been taking Lasix as needed. She had previously refused anticoagulation and was therefore continued on ASA and Lopressor with Flecainide being discontinued given no recent symptoms.  In the interim, she was admitted to [facility] from 3/23 - 06/09/2020 for evaluation of left-sided facial droop. CT Head and Brain MRI showed no acute intracranial findings but her MRI did mention a possible fundal tuft sign in the setting of suspected Bell's palsy on the left. She was evaluated by Neurology and symptoms were overall felt to be consistent with Bell's palsy and it was recommended she be treated with Prednisone 60 mg daily for 1 week followed by 10 mg daily along with Valacyclovir 1000 mg TID for 1 week. She was in NSR during admission and indications for anticoagulation were reviewed with the patient but she declined at that time. Repeat echocardiogram during admission showed a preserved EF of 65 to 70% with no regional wall motion abnormalities. She did have grade 1 diastolic dysfunction and trivial MR.  In talking with the patient today, she reports overall doing well since her hospitalization and reports her left facial droop continues to improve. She has made significant dietary changes since her hospitalization and has been reducing her sodium  intake with a weight loss of over 12 pounds in the past several weeks. She does have baseline dyspnea on exertion but reports her activity is limited secondary to back pain. No recent chest pain or palpitations. No recent orthopnea or PND. She does take Lasix 20 mg daily for lower extremity edema.   Past Medical History:  Diagnosis Date  . Arthritis   . Atrial fibrillation ([place])   . Cataract   . Congestive heart failure ([place])   . Dysrhythmia    AFib  . GERD (gastroesophageal reflux disease)   . Hemochromatosis   . Iron excess     Past Surgical History:  Procedure Laterality Date  . CESAREAN SECTION    . KNEE ARTHROSCOPY WITH MEDIAL MENISECTOMY Right 01/28/2019   Procedure: RIGHT KNEE ARTHROSCOPY WITH MEDIAL MENISCECTOMY;  Surgeon: Carole Civil, MD;  Location: [facility];  Service: Orthopedics;  Laterality: Right;  . TONSILLECTOMY    . VENTRAL HERNIA REPAIR N/A 01/26/2018   Procedure: HERNIA REPAIR VENTRAL ADULT WITH MESH;  Surgeon: Virl Cagey, MD;  Location: [facility];  Service: General;  Laterality: N/A;    Current Medications: Outpatient Medications Prior to Visit  Medication Sig Dispense Refill  . acetaminophen (TYLENOL) 650 MG CR tablet Take 650-1,300 mg by mouth every 8 (eight) hours as needed for pain.    [name] atorvastatin (LIPITOR) 10 MG tablet Take 1 tablet (10 mg total) by mouth 3 (three) times a week. At bedtime. 30 tablet 3  . Calcium Carb-Cholecalciferol (CALCIUM 600+D3 PO) Take 1 tablet by  mouth 2 (two) times daily.    . Cholecalciferol 25 MCG (1000 UT) capsule Take 1,000 Units by mouth daily.    . clotrimazole (LOTRIMIN AF) 1 % cream Apply 1 application topically 2 (two) times daily. (Patient taking differently: Apply 1 application topically 2 (two) times daily. Applied to toes twice daily for fungus) 30 g 1  . diclofenac (CATAFLAM) 50 MG tablet TAKE ONE TABLET (50MG  TOTAL) BY MOUTH TWO TIMES DAILY (Patient taking differently: Take 50 mg by mouth 2 (two) times  daily.) 60 tablet 3  . furosemide (LASIX) 20 MG tablet TAKE ONE TABLET (20MG  TOTAL) BY MOUTH DAILY AS NEEDED 30 tablet 0  . magnesium oxide (MAG-OX) 400 MG tablet Take 400 mg by mouth daily.    . potassium chloride (KLOR-CON) 10 MEQ tablet Take 1 po daily with lasix for leg cramps 30 tablet 2  . trolamine salicylate (ASPERCREME) 10 % cream Apply 1 application topically 2 (two) times daily as needed for muscle pain.     [name] aspirin 325 MG tablet Take 325 mg by mouth every evening.     . metoprolol tartrate (LOPRESSOR) 25 MG tablet Take 1 tablet (25 mg total) by mouth 2 (two) times daily. 180 tablet 0  . calcium carbonate (OSCAL) 1500 (600 Ca) MG TABS tablet  (Patient not taking: Reported on 06/07/2020)    . pantoprazole (PROTONIX) 40 MG tablet Take 1 tablet (40 mg total) by mouth daily at 6 (six) AM for 14 days. 14 tablet 0  . predniSONE (DELTASONE) 20 MG tablet Prednisone 60 mg daily for 5 days followed by  Prednisone 50 mg daily for 1 day followed by  Prednisone 40 mg daily for 1 day followed by  Prednisone 30 mg daily for 1 day followed by  Prednisone 20 mg daily for 1 day followed by  Prednisone 10 mg daily for 1 day and stop 23 tablet 0   No facility-administered medications prior to visit.     Allergies:   Tetanus toxoids   Social History   Socioeconomic History  . Marital status: Married    Spouse name: Not on file  . Number of children: Not on file  . Years of education: Not on file  . Highest education level: Not on file  Occupational History  . Not on file  Tobacco Use  . Smoking status: Never Smoker  . Smokeless tobacco: Never Used  Vaping Use  . Vaping Use: Never used  Substance and Sexual Activity  . Alcohol use: Yes    Alcohol/week: 2.0 standard drinks    Types: 2 Glasses of wine per week  . Drug use: No  . Sexual activity: Yes  Other Topics Concern  . Not on file  Social History Narrative   Retired Copywriter, advertising.   Married.   Has children 3. Grandchildren,  2.    Grew up in [place], [place].   Married for over 31 years.    Eats all food groups.   Just moved to [place], [place] in 8/18.    Social Determinants of Health   Financial Resource Strain: Not on file  Food Insecurity: Not on file  Transportation Needs: Not on file  Physical Activity: Not on file  Stress: Not on file  Social Connections: Not on file     Family History:  The patient's family history includes Arthritis in her brother; Atrial fibrillation in her son; Diabetes in her paternal grandfather; Stroke in her maternal grandmother and mother.   Review of Systems:  Please see the history of present illness.     General:  No chills, fever, night sweats or weight changes.  Cardiovascular:  No chest pain, orthopnea, palpitations, paroxysmal nocturnal dyspnea. Positive for edema.  Dermatological: No rash, lesions/masses Respiratory: No cough.  Urologic: No hematuria, dysuria Abdominal:   No nausea, vomiting, diarrhea, bright red blood per rectum, melena, or hematemesis Neurologic:  No visual changes, wkns, changes in mental status. Positive for facial droop.   All other systems reviewed and are otherwise negative except as noted above.   Physical Exam:    VS:  BP (!) 126/52   Pulse 98   Ht 5\' 6"  (1.676 m)   Wt 234 lb 12.8 oz (106.5 kg)   SpO2 98%   BMI 37.90 kg/m    General: Well developed, well nourished,female appearing in no acute distress. Head: Normocephalic, atraumatic. Neck: No carotid bruits. JVD not elevated.  Lungs: Respirations regular and unlabored, without wheezes or rales.  Heart: Regular rate and rhythm. No S3 or S4.  No murmur, no rubs, or gallops appreciated. Abdomen: Appears non-distended. No obvious abdominal masses. Msk:  Strength and tone appear normal for age. No obvious joint deformities or effusions. Extremities: No clubbing or cyanosis. No edema.  Distal pedal pulses are 2+ bilaterally. Neuro: Alert and oriented X 3. Moves all extremities  spontaneously. Facial droop most notable along left eye Psych:  Responds to questions appropriately with a normal affect. Skin: No rashes or lesions noted  Wt Readings from Last 3 Encounters:  06/28/20 234 lb 12.8 oz (106.5 kg)  06/07/20 245 lb (111.1 kg)  05/22/20 246 lb 6.4 oz (111.8 kg)      Studies/Labs Reviewed:   EKG:  EKG is not ordered today.   Recent Labs: 05/22/2020: Magnesium 1.9 06/07/2020: ALT 20; BUN 32; Creatinine, Ser 0.90; Hemoglobin 13.9; Platelets 197; Potassium 3.6; Sodium 144   Lipid Panel    Component Value Date/Time   CHOL 165 06/08/2020 0346   TRIG 131 06/08/2020 0346   HDL 40 (L) 06/08/2020 0346   CHOLHDL 4.1 06/08/2020 0346   VLDL 26 06/08/2020 0346   LDLCALC 99 06/08/2020 0346   LDLCALC 102 (H) 05/22/2020 1044    Additional studies/ records that were reviewed today include:   Echocardiogram: 05/2020 IMPRESSIONS    1. Left ventricular ejection fraction, by estimation, is 65 to 70%. The  left ventricle has normal function. The left ventricle has no regional  wall motion abnormalities. There is mild concentric left ventricular  hypertrophy. Left ventricular diastolic  parameters are consistent with Grade I diastolic dysfunction (impaired  relaxation).  2. Right ventricular systolic function is normal. The right ventricular  size is normal. There is normal pulmonary artery systolic pressure.  3. The mitral valve is normal in structure. Trivial mitral valve  regurgitation. No evidence of mitral stenosis.  4. The aortic valve is normal in structure. Aortic valve regurgitation is  not visualized. No aortic stenosis is present.  5. The inferior vena cava is normal in size with greater than 50%  respiratory variability, suggesting right atrial pressure of 3 mmHg.   Assessment:    1. PAF (paroxysmal atrial fibrillation) ([place])   2. Bilateral lower extremity edema   3. Mixed hyperlipidemia   4. Bell's palsy      Plan:   In order of  problems listed above:  1. Paroxysmal Atrial Fibrillation - She denies any recent palpitations and was in normal sinus rhythm during her recent admission. She remains on Lopressor  25 mg twice daily but is interested in stopping this if possible. I encouraged her to keep a heart rate and blood pressure log and report back with readings. Pending this, could further reduce to 12.5 mg twice daily but did not reduce today given her HR in the 90's.  - This patients CHA2DS2-VASc Score and unadjusted Ischemic Stroke Rate (% per year) is equal to 2.2 % stroke rate/year from a score of 2 (Female, Age). She previously declined anticoagulation and we reviewed indications for this again today. Given her last episode of atrial fibrillation was 15+ years ago and she was symptomatic at that time and denies any recent symptoms, would not plan to initiate anticoagulation currently. I encouraged her to reach out to [place] if she did have any palpitations as an event monitor would be beneficial to rule out any recurrent arrhythmias. She has been taking ASA 325 mg daily and I recommended reducing this to 81 mg daily given no acute indications for higher dosing from a cardiac or neurologic perspective.  2. Lower Extremity Edema - She reports her symptoms have overall been well controlled with Lasix 20 mg daily and she has also been reducing her sodium intake. Creatinine was stable at 0.83 by recent labs in 05/2020.  3. HLD - Recent FLP showed total cholesterol 165, triglycerides 131, HDL 40 and LDL 99. She was encouraged to remain on  Atorvastatin 10 mg three times weekly.   4. Bell's Palsy  - She still has a residual left-sided facial droop but symptoms continue to improver per her report. Being followed by Neurology.     Medication Adjustments/Labs and Tests Ordered: Current medicines are reviewed at length with the patient today.  Concerns regarding medicines are outlined above.  Medication changes, Labs and Tests ordered  today are listed in the Patient Instructions below. Patient Instructions  Medication Instructions:  Your physician has recommended you make the following change in your medication: DECREASE Aspirin to EC 81 mg tablets  *If you need a refill on your cardiac medications before your next appointment, please call your pharmacy*   Lab Work: None If you have labs (blood work) drawn today and your tests are completely normal, you will receive your results only by: [name] MyChart Message (if you have MyChart) OR . A paper copy in the mail If you have any lab test that is abnormal or we need to change your treatment, we will call you to review the results.   Testing/Procedures: None   Follow-Up: At [facility], you and your health needs are our priority.  As part of our continuing mission to provide you with exceptional heart care, we have created designated Provider Care Teams.  These Care Teams include your primary Cardiologist (physician) and Advanced Practice Providers (APPs -  Physician Assistants and Nurse Practitioners) who all work together to provide you with the care you need, when you need it.  We recommend signing up for the patient portal called "MyChart".  Sign up information is provided on this After Visit Summary.  MyChart is used to connect with patients for Virtual Visits (Telemedicine).  Patients are able to view lab/test results, encounter notes, upcoming appointments, etc.  Non-urgent messages can be sent to your provider as well.   To learn more about what you can do with MyChart, go to [email or web address].    Your next appointment:   12 month(s)  The format for your next appointment:   In Person  Provider:   You may establish  with a MD or one of the following Advanced Practice Providers on your designated Care Team:    Bernerd Pho, [place]    Other Instructions Please keep a BP/HR log and update our office in 3-4 weeks.         Signed, Erma Heritage, PA-C  06/28/2020 5:43 PM    [facility] [address] [place], [place] [address] Phone: [phone] Fax: [phone]

## 2020-06-28 NOTE — Patient Instructions (Signed)
Medication Instructions:  Your physician has recommended you make the following change in your medication: DECREASE Aspirin to EC 81 mg tablets  *If you need a refill on your cardiac medications before your next appointment, please call your pharmacy*   Lab Work: None If you have labs (blood work) drawn today and your tests are completely normal, you will receive your results only by: [name] MyChart Message (if you have MyChart) OR . A paper copy in the mail If you have any lab test that is abnormal or we need to change your treatment, we will call you to review the results.   Testing/Procedures: None   Follow-Up: At [facility], you and your health needs are our priority.  As part of our continuing mission to provide you with exceptional heart care, we have created designated Provider Care Teams.  These Care Teams include your primary Cardiologist (physician) and Advanced Practice Providers (APPs -  Physician Assistants and Nurse Practitioners) who all work together to provide you with the care you need, when you need it.  We recommend signing up for the patient portal called "MyChart".  Sign up information is provided on this After Visit Summary.  MyChart is used to connect with patients for Virtual Visits (Telemedicine).  Patients are able to view lab/test results, encounter notes, upcoming appointments, etc.  Non-urgent messages can be sent to your provider as well.   To learn more about what you can do with MyChart, go to [email or web address].    Your next appointment:   12 month(s)  The format for your next appointment:   In Person  Provider:   You may establish with a MD or one of the following Advanced Practice Providers on your designated Care Team:    Bernerd Pho, [place]    Other Instructions Please keep a BP/HR log and update our office in 3-4 weeks.

## 2020-06-29 ENCOUNTER — Encounter: Payer: Self-pay | Admitting: Podiatry

## 2020-06-29 ENCOUNTER — Ambulatory Visit: Payer: Medicare PPO | Admitting: Podiatry

## 2020-06-29 DIAGNOSIS — M79674 Pain in right toe(s): Secondary | ICD-10-CM | POA: Diagnosis not present

## 2020-06-29 DIAGNOSIS — M79675 Pain in left toe(s): Secondary | ICD-10-CM

## 2020-06-29 DIAGNOSIS — B351 Tinea unguium: Secondary | ICD-10-CM

## 2020-06-29 DIAGNOSIS — L608 Other nail disorders: Secondary | ICD-10-CM

## 2020-06-29 NOTE — Progress Notes (Signed)
This patient returns to the office for evaluation and treatment of long thick painful nails .  This patient is unable to trim her own nails since the patient cannot reach her feet.  Patient says the nails are painful walking and wearing her shoes.  Sh e returns for preventive foot care services.  General Appearance  Alert, conversant and in no acute stress.  Vascular  Dorsalis pedis and posterior tibial  pulses are not  Palpable due to swelling legs/feet.  bilaterally.  Capillary return is within normal limits  Bilaterally.Cold feet  Bilaterally.  Absent digital hair  B/L.  Neurologic  Senn-Weinstein monofilament wire test within normal limits  bilaterally. Muscle power within normal limits bilaterally.  Nails Thick disfigured discolored nails with subungual debris  from hallux to fifth toes bilaterally. No evidence of bacterial infection or drainage bilaterally. Pincer hallux nails  B/L.  Orthopedic  No limitations of motion  feet .  No crepitus or effusions noted.  No bony pathology or digital deformities noted.  Skin  normotropic skin with no porokeratosis noted bilaterally.  No signs of infections or ulcers noted.     Onychomycosis  Pain in toes right foot  Pain in toes left foot  Debridement  of nails  1-5  B/L with a nail nipper.  Nails were then filed using a dremel tool with no incidents.    RTC 10 weeks    Gardiner Barefoot DPM

## 2020-06-30 ENCOUNTER — Other Ambulatory Visit: Payer: Self-pay

## 2020-06-30 ENCOUNTER — Encounter (HOSPITAL_COMMUNITY): Payer: Self-pay | Admitting: Emergency Medicine

## 2020-06-30 ENCOUNTER — Ambulatory Visit (INDEPENDENT_AMBULATORY_CARE_PROVIDER_SITE_OTHER): Payer: Medicare PPO

## 2020-06-30 ENCOUNTER — Emergency Department (HOSPITAL_COMMUNITY): Payer: Medicare PPO

## 2020-06-30 ENCOUNTER — Emergency Department (HOSPITAL_COMMUNITY)
Admission: EM | Admit: 2020-06-30 | Discharge: 2020-06-30 | Disposition: A | Discharge status: Home / Self Care | Payer: Medicare PPO | Attending: Emergency Medicine | Admitting: Emergency Medicine

## 2020-06-30 ENCOUNTER — Telehealth: Payer: Self-pay | Admitting: Student

## 2020-06-30 VITALS — BP 122/78 | HR 149

## 2020-06-30 DIAGNOSIS — I482 Chronic atrial fibrillation, unspecified: Secondary | ICD-10-CM

## 2020-06-30 DIAGNOSIS — I5032 Chronic diastolic (congestive) heart failure: Secondary | ICD-10-CM | POA: Diagnosis not present

## 2020-06-30 DIAGNOSIS — Z7982 Long term (current) use of aspirin: Secondary | ICD-10-CM | POA: Insufficient documentation

## 2020-06-30 DIAGNOSIS — I110 Hypertensive heart disease with heart failure: Secondary | ICD-10-CM | POA: Insufficient documentation

## 2020-06-30 DIAGNOSIS — I4891 Unspecified atrial fibrillation: Secondary | ICD-10-CM

## 2020-06-30 DIAGNOSIS — I480 Paroxysmal atrial fibrillation: Secondary | ICD-10-CM

## 2020-06-30 DIAGNOSIS — R002 Palpitations: Secondary | ICD-10-CM | POA: Diagnosis present

## 2020-06-30 DIAGNOSIS — Z79899 Other long term (current) drug therapy: Secondary | ICD-10-CM | POA: Diagnosis not present

## 2020-06-30 LAB — CBC WITH DIFFERENTIAL/PLATELET
Abs Immature Granulocytes: 0.01 10*3/uL (ref 0.00–0.07)
Basophils Absolute: 0.1 10*3/uL (ref 0.0–0.1)
Basophils Relative: 1 %
Eosinophils Absolute: 0.2 10*3/uL (ref 0.0–0.5)
Eosinophils Relative: 3 %
HCT: 46.2 % — ABNORMAL HIGH (ref 36.0–46.0)
Hemoglobin: 15.2 g/dL — ABNORMAL HIGH (ref 12.0–15.0)
Immature Granulocytes: 0 %
Lymphocytes Relative: 23 %
Lymphs Abs: 1.4 10*3/uL (ref 0.7–4.0)
MCH: 32.4 pg (ref 26.0–34.0)
MCHC: 32.9 g/dL (ref 30.0–36.0)
MCV: 98.5 fL (ref 80.0–100.0)
Monocytes Absolute: 0.6 10*3/uL (ref 0.1–1.0)
Monocytes Relative: 10 %
Neutro Abs: 3.9 10*3/uL (ref 1.7–7.7)
Neutrophils Relative %: 63 %
Platelets: 200 10*3/uL (ref 150–400)
RBC: 4.69 MIL/uL (ref 3.87–5.11)
RDW: 12.8 % (ref 11.5–15.5)
WBC: 6.2 10*3/uL (ref 4.0–10.5)
nRBC: 0 % (ref 0.0–0.2)

## 2020-06-30 LAB — TROPONIN I (HIGH SENSITIVITY)
Troponin I (High Sensitivity): 8 ng/L (ref <18)
Troponin I (High Sensitivity): 8 ng/L (ref <18)

## 2020-06-30 LAB — COMPREHENSIVE METABOLIC PANEL
ALT: 23 U/L (ref 0–44)
AST: 20 U/L (ref 15–41)
Albumin: 3.9 g/dL (ref 3.5–5.0)
Alkaline Phosphatase: 73 U/L (ref 38–126)
Anion gap: 11 (ref 5–15)
BUN: 25 mg/dL — ABNORMAL HIGH (ref 8–23)
CO2: 25 mmol/L (ref 22–32)
Calcium: 9.5 mg/dL (ref 8.9–10.3)
Chloride: 106 mmol/L (ref 98–111)
Creatinine, Ser: 0.96 mg/dL (ref 0.44–1.00)
GFR, Estimated: >60 mL/min (ref >60)
Glucose, Bld: 106 mg/dL — ABNORMAL HIGH (ref 70–99)
Potassium: 3.9 mmol/L (ref 3.5–5.1)
Sodium: 142 mmol/L (ref 135–145)
Total Bilirubin: 0.6 mg/dL (ref 0.3–1.2)
Total Protein: 7 g/dL (ref 6.5–8.1)

## 2020-06-30 LAB — TSH: TSH: 3.161 u[IU]/mL (ref 0.350–4.500)

## 2020-06-30 MED ORDER — DILTIAZEM HCL-DEXTROSE 125-5 MG/125ML-% IV SOLN (PREMIX)
5.0000 mg/h | INTRAVENOUS | Status: DC
  Administered 2020-06-30: 5 mg/h via INTRAVENOUS
  Filled 2020-06-30: qty 125

## 2020-06-30 MED ORDER — HEPARIN (PORCINE) 25000 UT/250ML-% IV SOLN
1000.0000 [IU]/h | INTRAVENOUS | Status: DC
  Administered 2020-06-30: 1000 [IU]/h via INTRAVENOUS
  Filled 2020-06-30: qty 250

## 2020-06-30 MED ORDER — FLECAINIDE ACETATE 50 MG PO TABS
300.0000 mg | ORAL_TABLET | Freq: Once | ORAL | Status: AC
  Administered 2020-06-30: 300 mg via ORAL
  Filled 2020-06-30: qty 6

## 2020-06-30 MED ORDER — METOPROLOL TARTRATE 50 MG PO TABS: 50.0000 mg | ORAL_TABLET | Freq: Two times a day (BID) | ORAL | 0 refills | Status: DC

## 2020-06-30 MED ORDER — SODIUM CHLORIDE 0.9 % IV BOLUS
500.0000 mL | Freq: Once | INTRAVENOUS | Status: AC
  Administered 2020-06-30: 500 mL via INTRAVENOUS

## 2020-06-30 MED ORDER — APIXABAN 5 MG PO TABS
5.0000 mg | ORAL_TABLET | Freq: Once | ORAL | Status: AC
  Administered 2020-06-30: 5 mg via ORAL
  Filled 2020-06-30: qty 1

## 2020-06-30 MED ORDER — DILTIAZEM HCL 25 MG/5ML IV SOLN
10.0000 mg | Freq: Once | INTRAVENOUS | Status: AC
  Administered 2020-06-30: 10 mg via INTRAVENOUS
  Filled 2020-06-30: qty 5

## 2020-06-30 MED ORDER — FLECAINIDE ACETATE 150 MG PO TABS: 75.0000 mg | ORAL_TABLET | Freq: Two times a day (BID) | ORAL | 0 refills | Status: DC

## 2020-06-30 MED ORDER — HEPARIN BOLUS VIA INFUSION
4000.0000 [IU] | Freq: Once | INTRAVENOUS | Status: AC
  Administered 2020-06-30: 4000 [IU] via INTRAVENOUS

## 2020-06-30 MED ORDER — ETOMIDATE 2 MG/ML IV SOLN
INTRAVENOUS | Status: AC | PRN
  Administered 2020-06-30: 5 mg via INTRAVENOUS

## 2020-06-30 MED ORDER — APIXABAN 5 MG PO TABS: 5.0000 mg | ORAL_TABLET | Freq: Two times a day (BID) | ORAL | 0 refills | Status: DC

## 2020-06-30 MED ORDER — ETOMIDATE 2 MG/ML IV SOLN
5.0000 mg | Freq: Once | INTRAVENOUS | Status: AC
  Administered 2020-06-30: 5 mg via INTRAVENOUS
  Filled 2020-06-30: qty 10

## 2020-06-30 NOTE — ED Notes (Signed)
Pt in bed, pt NSR on the cardiac monitor, sig other at bedside, pt denies pain, pt states that she is ready to go home, d/c pt iv, cath intact, no redness noted at former iv site on R ac, some swelling noted, pt denies pain in this area, explained that if she started having pain or redness to return to er, pt verbalized understanding, reviewed d/c instructions and follow up with pt, pt verbalized understanding, pt from dpt with sig other.

## 2020-06-30 NOTE — Consult Note (Addendum)
Cardiology Consultation:   Patient ID: Amanda Mckay MRN: [id number]; DOB: 12/27/1945  Admit date: 06/30/2020 Date of Consult: 06/30/2020  PCP:  Alycia Rossetti, [place]  Cardiologist:  Kate Sable, MD (Inactive) --> Needs to switch to new MD  Patient Profile:   Amanda Mckay is a 75 y.o. female with past medical history of paroxysmal atrial fibrillation (last known episode 15+ years ago), GERD, hemochromatosis and recently diagnosed Bell's palsy (in 05/2020) who presented to the office today for a nurse visit and was found to be in atrial fibrillation with RVR.  History of Present Illness:   Ms. Faddis was recently examined by myself in clinic 2 days ago and reported overall doing well at that time and denied any recent chest pain, palpitations or dyspnea on exertion. She had recently been admitted for Bell's palsy and reported her left facial droop continued to improve. She was in a normal sinus rhythm at that time on examination (had been in sinus rhythm by EKG in 05/2020 as well) and was continued on Lopressor 25 mg twice daily for rate control. She had not been on anticoagulation given her last episode of atrial fibrillation was 15+ years ago.  She called the office this morning reporting palpitations which had started around 2200 on 06/29/2020. Says she can feel her heart racing and had experienced some associated dizziness. Also reported feeling like she had to belch.  She did report some dyspnea but denied any recent orthopnea, PND or edema. She was advised to come to the office for a nurse visit.  EKG obtained during the office visit today shows atrial fibrillation, RVR with heart rate of 149.   Past Medical History:  Diagnosis Date  . Arthritis   . Atrial fibrillation ([place])   . Cataract   . Congestive heart failure ([place])   . Dysrhythmia    AFib  . GERD (gastroesophageal reflux disease)   . Hemochromatosis   . Iron  excess     Past Surgical History:  Procedure Laterality Date  . CESAREAN SECTION    . KNEE ARTHROSCOPY WITH MEDIAL MENISECTOMY Right 01/28/2019   Procedure: RIGHT KNEE ARTHROSCOPY WITH MEDIAL MENISCECTOMY;  Surgeon: Carole Civil, MD;  Location: [facility];  Service: Orthopedics;  Laterality: Right;  . TONSILLECTOMY    . VENTRAL HERNIA REPAIR N/A 01/26/2018   Procedure: HERNIA REPAIR VENTRAL ADULT WITH MESH;  Surgeon: Virl Cagey, MD;  Location: [facility];  Service: General;  Laterality: N/A;     Home Medications:  Prior to Admission medications   Medication Sig Start Date End Date Taking? Authorizing Provider  acetaminophen (TYLENOL) 650 MG CR tablet Take 650-1,300 mg by mouth every 8 (eight) hours as needed for pain.    [provider]  aspirin EC 81 MG tablet Take 1 tablet (81 mg total) by mouth daily. Swallow whole. 06/28/20   Strader, Fransisco Hertz, PA-C  atorvastatin (LIPITOR) 10 MG tablet Take 1 tablet (10 mg total) by mouth 3 (three) times a week. At bedtime. 09/10/19   Alycia Rossetti, MD  Calcium Carb-Cholecalciferol (CALCIUM 600+D3 PO) Take 1 tablet by mouth 2 (two) times daily.    [provider]  Cholecalciferol 25 MCG (1000 UT) capsule Take 1,000 Units by mouth daily.    [provider]  clotrimazole (LOTRIMIN AF) 1 % cream Apply 1 application topically 2 (two) times daily. Patient taking differently: Apply 1 application topically 2 (two) times daily. Applied to  toes twice daily for fungus 12/28/18   Susy Frizzle, MD  diclofenac (CATAFLAM) 50 MG tablet TAKE ONE TABLET (50MG  TOTAL) BY MOUTH TWO TIMES DAILY Patient taking differently: Take 50 mg by mouth 2 (two) times daily. 05/12/20   Carole Civil, MD  furosemide (LASIX) 20 MG tablet TAKE ONE TABLET (20MG  TOTAL) BY MOUTH DAILY AS NEEDED 06/15/20   Alycia Rossetti, MD  magnesium oxide (MAG-OX) 400 MG tablet Take 400 mg by mouth daily.    [provider]  metoprolol tartrate  (LOPRESSOR) 25 MG tablet Take 1 tablet (25 mg total) by mouth 2 (two) times daily. 06/28/20   Strader, Fransisco Hertz, PA-C  potassium chloride (KLOR-CON) 10 MEQ tablet Take 1 po daily with lasix for leg cramps 12/15/19   Alycia Rossetti, MD  trolamine salicylate (ASPERCREME) 10 % cream Apply 1 application topically 2 (two) times daily as needed for muscle pain.     [provider]    Inpatient Medications: Scheduled Meds:  Continuous Infusions: . diltiazem (CARDIZEM) infusion 5 mg/hr (06/30/20 1216)  . heparin 1,000 Units/hr (06/30/20 1227)   PRN Meds:   Allergies:    Allergies  Allergen Reactions  . Tetanus Toxoids Other (See Comments)    Reaction occurred more than 40 yrs ago--does not remember what occurred.    Social History:   Social History   Socioeconomic History  . Marital status: Married    Spouse name: Not on file  . Number of children: Not on file  . Years of education: Not on file  . Highest education level: Not on file  Occupational History  . Not on file  Tobacco Use  . Smoking status: Never Smoker  . Smokeless tobacco: Never Used  Vaping Use  . Vaping Use: Never used  Substance and Sexual Activity  . Alcohol use: Yes    Alcohol/week: 2.0 standard drinks    Types: 2 Glasses of wine per week  . Drug use: No  . Sexual activity: Yes  Other Topics Concern  . Not on file  Social History Narrative   Retired Copywriter, advertising.   Married.   Has children 3. Grandchildren, 2.    Grew up in [place], [place].   Married for over 31 years.    Eats all food groups.   Just moved to [place], [place] in 8/18.    Social Determinants of Health   Financial Resource Strain: Not on file  Food Insecurity: Not on file  Transportation Needs: Not on file  Physical Activity: Not on file  Stress: Not on file  Social Connections: Not on file  Intimate Partner Violence: Not on file    Family History:    Family History  Problem Relation Age of Onset  . Stroke  Mother   . Atrial fibrillation Son   . Stroke Maternal Grandmother   . Diabetes Paternal Grandfather   . Arthritis Brother      ROS:  Please see the history of present illness.   All other ROS reviewed and negative.     Physical Exam/Data:   Vitals:   06/30/20 1138 06/30/20 1141 06/30/20 1218  BP:  113/67 (!) 98/52  Pulse:  (!) 146 88  Resp:  18 16  Temp:  98 F (36.7 C)   SpO2:  97% 97%  Weight: 106.1 kg    Height: 5\' 6"  (1.676 m)     No intake or output data in the 24 hours ending 06/30/20 1229 Last 3 Weights 06/30/2020  06/28/2020 06/07/2020  Weight (lbs) 234 lb 234 lb 12.8 oz 245 lb  Weight (kg) 106.142 kg 106.505 kg 111.131 kg     Body mass index is 37.77 kg/m.  General:  Well nourished, well developed female appearing in no acute distress HEENT: normal Lymph: no adenopathy Neck: no JVD Endocrine:  No thryomegaly Vascular: No carotid bruits; FA pulses 2+ bilaterally without bruits  Cardiac: Irregular irregular, tachycardic; no murmur. Lungs:  clear to auscultation bilaterally, no wheezing, rhonchi or rales  Abd: soft, nontender, no hepatomegaly  Ext: no edema Musculoskeletal:  No deformities, BUE and BLE strength normal and equal Skin: warm and dry  Neuro:  CNs 2-12 intact, no focal abnormalities noted Psych:  Normal affect   EKG:  The EKG was personally reviewed and demonstrates: Atrial fibrillation with RVR, heart rate 149  Relevant CV Studies:  Echocardiogram: 05/2020 IMPRESSIONS    1. Left ventricular ejection fraction, by estimation, is 65 to 70%. The  left ventricle has normal function. The left ventricle has no regional  wall motion abnormalities. There is mild concentric left ventricular  hypertrophy. Left ventricular diastolic  parameters are consistent with Grade I diastolic dysfunction (impaired  relaxation).  2. Right ventricular systolic function is normal. The right ventricular  size is normal. There is normal pulmonary artery systolic  pressure.  3. The mitral valve is normal in structure. Trivial mitral valve  regurgitation. No evidence of mitral stenosis.  4. The aortic valve is normal in structure. Aortic valve regurgitation is  not visualized. No aortic stenosis is present.  5. The inferior vena cava is normal in size with greater than 50%  respiratory variability, suggesting right atrial pressure of 3 mmHg.   Laboratory Data:  High Sensitivity Troponin:  No results for input(s): TROPONINIHS in the last 720 hours.   ChemistryNo results for input(s): NA, K, CL, CO2, GLUCOSE, BUN, CREATININE, CALCIUM, GFRNONAA, GFRAA, ANIONGAP in the last 168 hours.  No results for input(s): PROT, ALBUMIN, AST, ALT, ALKPHOS, BILITOT in the last 168 hours. Hematology Recent Labs  Lab 06/30/20 1209  WBC 6.2  RBC 4.69  HGB 15.2*  HCT 46.2*  MCV 98.5  MCH 32.4  MCHC 32.9  RDW 12.8  PLT 200   BNPNo results for input(s): BNP, PROBNP in the last 168 hours.  DDimer No results for input(s): DDIMER in the last 168 hours.   Radiology/Studies:  DG Chest Port 1 View  Result Date: 06/30/2020 CLINICAL DATA:  AFib, shortness of breath EXAM: PORTABLE CHEST 1 VIEW COMPARISON:  None. FINDINGS: The heart size and mediastinal contours are within normal limits. Both lungs are clear. The visualized skeletal structures are unremarkable. IMPRESSION: No acute abnormality of the lungs in [facility] portable projection. Electronically Signed   By: Eddie Candle M.D.   On: 06/30/2020 12:26     Assessment and Plan:   1. Atrial Fibrillation with RVR - She has a remote history of atrial fibrillation 15+ years ago but this is her first known episode since. She was previously on Flecainide but this was discontinued in 2019 given that she had maintained normal sinus rhythm.  Echocardiogram just last month showed a preserved EF of 65 to 70% with no regional wall motion abnormalities and left atrial size was normal. - She reports new onset palpitations which  started last night with associated dyspnea and dizziness and was just evaluated in the office 2 days prior and denied any recent symptoms and heart rate was well controlled. - Reviewed with Dr.  Acsa Estey (DOD) and will send her to the Emergency Department for further management. Would anticipate giving IV Cardizem or IV Lopressor initially for rate control and then can consider chemical cardioversion with Flecainide 300 mg daily. If she does not convert with this, would consider DCCV as she is less than 48 hours out from onset of her arrhythmia. Will review with Dr. Harrington Challenger in regards to reinitiating PO Flecainide for antiarrhythmic control following ED evaluation.  - She was previously not on anticoagulation but this was again reviewed with the patient today and would plan to start Eliquis 5 mg twice daily.   Risk Assessment/Risk Scores:    CHA2DS2-VASc Score = 2  This indicates a 2.2% annual risk of stroke. The patient's score is based upon: CHF History: No HTN History: No Diabetes History: No Stroke History: No Vascular Disease History: No Age Score: 1 Gender Score: 1       For questions or updates, please contact [place] Please consult [email or web address] for contact info under    Signed, Erma Heritage, PA-C  06/30/2020 12:29 PM   Pt seen and examined   I agree with findings of B Strader above   Pt is a 75 yo with remote hx of atrial fibrillation  (approx 15 years ago)   Followed at time in [place]   Cardioverted  Controlled on flecanide with no spells   About 1 yr ago taken off of flecanide    Seen in clinic on 06/28/20, clinically in [place] (per B Strader)     Last night in bed felt heart racing like it did in remote past   Pt presented to clinic today with some fatigue, weakiness, and HR 120s  Afib with RVR  ON exam, Pt in NAD   NEck:  JVP is normal Cardiac exam Irreg irreg    No S3  No murmurs Abd is supple   Ext are without edema  12 lead EKG Afib with RVR  I have recomm  that pt go to ED   Start IV anticoagulation now (with pharmacy)  Plan will need long term NOAC   Would try to slow with IV meds    If remains in Afib then would recomm flecanide x 1 (300 mg)   To chemically cardiovert  Will continue to follow   WOuld check TSH Will plan for echo as outpt.  Dorris Carnes MD

## 2020-06-30 NOTE — Telephone Encounter (Signed)
Patient c/o palpitations and a fluttering feeling in her chest. She also states that she feels like she's also having indigestion. Patient admitted to having [place] food last for dinner. She also c/o SOB and chest tightness. Patients bp was 107/64 with a hr of 76. Patient has agreed to be seen in the office today for a nurse visit (EKG).

## 2020-06-30 NOTE — ED Provider Notes (Signed)
[facility] EMERGENCY DEPARTMENT Provider Note   CSN: [id number] Arrival date & time: 06/30/20  1137     History Chief Complaint  Patient presents with  . Tachycardia    Amanda Mckay is a 75 y.o. female.  Patient was seen by the cardiology today and it was determined she was in rapid A. fib.  The symptoms started last night around 10:00.  She was sent over to the emergency department from the cardiologist office to attempt conversion with Cardizem and flecainide  The history is provided by the patient and medical records. No language interpreter was used.  Palpitations Palpitations quality:  Irregular Onset quality:  Sudden Timing:  Constant Progression:  Waxing and waning Chronicity:  Recurrent Context: anxiety   Relieved by:  Nothing Worsened by:  Nothing Ineffective treatments:  None tried Associated symptoms: no back pain, no chest pain and no cough        Past Medical History:  Diagnosis Date  . Arthritis   . Atrial fibrillation ([place])   . Cataract   . Congestive heart failure ([place])   . Dysrhythmia    AFib  . GERD (gastroesophageal reflux disease)   . Hemochromatosis   . Iron excess     Patient Active Problem List   Diagnosis Date Noted  . Facial droop 06/07/2020  . AF (paroxysmal atrial fibrillation) ([place]) 06/07/2020  . RLS (restless legs syndrome) 01/04/2020  . Osteopenia 12/31/2019  . Class 3 obesity 09/01/2019  . Pincer nail deformity 06/10/2019  . S/P right knee arthroscopy 01/28/2019 03/08/2019  . Derangement of posterior horn of medial meniscus of right knee   . Primary osteoarthritis of right knee   . Pain due to onychomycosis of toenails of both feet 12/07/2018  . Peripheral edema 12/01/2018  . Essential hypertension   . Chronic diastolic HF (heart failure) ([place])   . Ventral hernia without obstruction or gangrene 08/01/2017  . Hyperlipidemia 02/27/2017  . Vitamin D deficiency 02/27/2017  . Hereditary hemochromatosis ([place])  02/27/2017  . Atrial fibrillation, chronic 02/27/2017    Past Surgical History:  Procedure Laterality Date  . CESAREAN SECTION    . KNEE ARTHROSCOPY WITH MEDIAL MENISECTOMY Right 01/28/2019   Procedure: RIGHT KNEE ARTHROSCOPY WITH MEDIAL MENISCECTOMY;  Surgeon: Carole Civil, MD;  Location: [facility];  Service: Orthopedics;  Laterality: Right;  . TONSILLECTOMY    . VENTRAL HERNIA REPAIR N/A 01/26/2018   Procedure: HERNIA REPAIR VENTRAL ADULT WITH MESH;  Surgeon: Virl Cagey, MD;  Location: [facility];  Service: General;  Laterality: N/A;     OB History    Gravida      Para      Term      Preterm      AB      Living  3     SAB      IAB      Ectopic      Multiple      Live Births              Family History  Problem Relation Age of Onset  . Stroke Mother   . Atrial fibrillation Son   . Stroke Maternal Grandmother   . Diabetes Paternal Grandfather   . Arthritis Brother     Social History   Tobacco Use  . Smoking status: Never Smoker  . Smokeless tobacco: Never Used  Vaping Use  . Vaping Use: Never used  Substance Use Topics  . Alcohol use: Yes  Alcohol/week: 2.0 standard drinks    Types: 2 Glasses of wine per week  . Drug use: No    Home Medications Prior to Admission medications   Medication Sig Start Date End Date Taking? Authorizing Provider  acetaminophen (TYLENOL) 650 MG CR tablet Take 650-1,300 mg by mouth every 8 (eight) hours as needed for pain.   Yes [provider]  atorvastatin (LIPITOR) 10 MG tablet Take 1 tablet (10 mg total) by mouth 3 (three) times a week. At bedtime. 09/10/19  Yes [place], Modena Nunnery, MD  Calcium Carb-Cholecalciferol (CALCIUM 600+D3 PO) Take 1 tablet by mouth 2 (two) times daily.   Yes [provider]  Cholecalciferol 25 MCG (1000 UT) capsule Take 1,000 Units by mouth daily.   Yes [provider]  clotrimazole (LOTRIMIN AF) 1 % cream Apply 1 application topically 2 (two) times  daily. Patient taking differently: Apply 1 application topically 2 (two) times daily. Applied to toes twice daily for fungus 12/28/18  Yes [place], Cammie Mcgee, MD  diclofenac (CATAFLAM) 50 MG tablet TAKE ONE TABLET (50MG  TOTAL) BY MOUTH TWO TIMES DAILY Patient taking differently: Take 50 mg by mouth 2 (two) times daily. 05/12/20  Yes Carole Civil, MD  furosemide (LASIX) 20 MG tablet TAKE ONE TABLET (20MG  TOTAL) BY MOUTH DAILY AS NEEDED Patient taking differently: Take 20 mg by mouth daily. 06/15/20  Yes [place], Modena Nunnery, MD  magnesium oxide (MAG-OX) 400 MG tablet Take 400 mg by mouth daily.   Yes [provider]  metoprolol tartrate (LOPRESSOR) 25 MG tablet Take 1 tablet (25 mg total) by mouth 2 (two) times daily. 06/28/20  Yes Strader, [place] M, PA-C  potassium chloride (KLOR-CON) 10 MEQ tablet Take 1 po daily with lasix for leg cramps Patient taking differently: Take 10 mEq by mouth See admin instructions. with lasix for leg cramps 12/15/19  Yes [place], Modena Nunnery, MD  trolamine salicylate (ASPERCREME) 10 % cream Apply 1 application topically 2 (two) times daily as needed for muscle pain.    Yes [provider]  aspirin EC 81 MG tablet Take 1 tablet (81 mg total) by mouth daily. Swallow whole. 06/28/20   Strader, Fransisco Hertz, PA-C    Allergies    Tetanus toxoids  Review of Systems   Review of Systems  Constitutional: Negative for appetite change and fatigue.  HENT: Negative for congestion, ear discharge and sinus pressure.   Eyes: Negative for discharge.  Respiratory: Negative for cough.   Cardiovascular: Positive for palpitations. Negative for chest pain.  Gastrointestinal: Negative for abdominal pain and diarrhea.  Genitourinary: Negative for frequency and hematuria.  Musculoskeletal: Negative for back pain.  Skin: Negative for rash.  Neurological: Negative for seizures and headaches.  Psychiatric/Behavioral: Negative for hallucinations.    Physical  Exam Updated Vital Signs BP 104/65 (BP Location: Left Arm)   Pulse 96   Temp 98.1 F (36.7 C) (Oral)   Resp 20   Ht 5\' 6"  (1.676 m)   Wt 106.1 kg   SpO2 97%   BMI 37.77 kg/m   Physical Exam Vitals and nursing note reviewed.  Constitutional:      Appearance: She is well-developed.  HENT:     Head: Normocephalic.     Nose: Nose normal.  Eyes:     General: No scleral icterus.    Conjunctiva/sclera: Conjunctivae normal.  Neck:     Thyroid: No thyromegaly.  Cardiovascular:     Rate and Rhythm: Tachycardia present. Rhythm irregular.  Heart sounds: No murmur heard. No friction rub. No gallop.   Pulmonary:     Breath sounds: No stridor. No wheezing or rales.  Chest:     Chest wall: No tenderness.  Abdominal:     General: There is no distension.     Tenderness: There is no abdominal tenderness. There is no rebound.  Musculoskeletal:        General: Normal range of motion.     Cervical back: Neck supple.  Lymphadenopathy:     Cervical: No cervical adenopathy.  Skin:    Findings: No erythema or rash.  Neurological:     Mental Status: She is alert and oriented to person, place, and time.     Motor: No abnormal muscle tone.     Coordination: Coordination normal.  Psychiatric:        Behavior: Behavior normal.     ED Results / Procedures / Treatments   Labs (all labs ordered are listed, but only abnormal results are displayed) Labs Reviewed  CBC WITH DIFFERENTIAL/PLATELET - Abnormal; Notable for the following components:      Result Value   Hemoglobin 15.2 (*)    HCT 46.2 (*)    All other components within normal limits  COMPREHENSIVE METABOLIC PANEL - Abnormal; Notable for the following components:   Glucose, Bld 106 (*)    BUN 25 (*)    All other components within normal limits  TSH  HEPARIN LEVEL (UNFRACTIONATED)  TROPONIN I (HIGH SENSITIVITY)  TROPONIN I (HIGH SENSITIVITY)    EKG None  Radiology DG Chest Port 1 View  Result Date:  06/30/2020 CLINICAL DATA:  AFib, shortness of breath EXAM: PORTABLE CHEST 1 VIEW COMPARISON:  None. FINDINGS: The heart size and mediastinal contours are within normal limits. Both lungs are clear. The visualized skeletal structures are unremarkable. IMPRESSION: No acute abnormality of the lungs in AP portable projection. Electronically Signed   By: Eddie Candle M.D.   On: 06/30/2020 12:26    Procedures Procedures   Medications Ordered in ED Medications  diltiazem (CARDIZEM) 125 mg in dextrose 5% 125 mL (1 mg/mL) infusion (0 mg/hr Intravenous Stopped 06/30/20 1400)  heparin bolus via infusion 4,000 Units (4,000 Units Intravenous Bolus from Bag 06/30/20 1225)    Followed by  heparin ADULT infusion 100 units/mL (25000 units/232mL) (1,000 Units/hr Intravenous New Bag/Given 06/30/20 1227)  sodium chloride 0.9 % bolus 500 mL (0 mLs Intravenous Stopped 06/30/20 1330)  diltiazem (CARDIZEM) injection 10 mg (10 mg Intravenous Given 06/30/20 1211)  flecainide (TAMBOCOR) tablet 300 mg (300 mg Oral Given 06/30/20 1334)  sodium chloride 0.9 % bolus 500 mL (500 mLs Intravenous New Bag/Given 06/30/20 1444)    ED Course  I have reviewed the triage vital signs and the nursing notes.  Pertinent labs & imaging results that were available during my care of the patient were reviewed by me and considered in my medical decision making (see chart for details). CRITICAL CARE Performed by: Milton Ferguson Total critical care time: 45 minutes Critical care time was exclusive of separately billable procedures and treating other patients. Critical care was necessary to treat or prevent imminent or life-threatening deterioration. Critical care was time spent personally by me on the following activities: development of treatment plan with patient and/or surrogate as well as nursing, discussions with consultants, evaluation of patient's response to treatment, examination of patient, obtaining history from patient or surrogate,  ordering and performing treatments and interventions, ordering and review of laboratory studies, ordering and review of  radiographic studies, pulse oximetry and re-evaluation of patient's condition.   Patient was given Cardizem and it slowed her heart rate down to about 80, patient did not convert on the flecainide and will be cardioverted MDM Rules/Calculators/A&P                          Atrial fib with rapid rate.  Patient seen by cardiology and they recommend cardioversion Final Clinical Impression(s) / ED Diagnoses Final diagnoses:  None    Rx / DC Orders ED Discharge Orders    None       Milton Ferguson, MD 07/04/20 1105

## 2020-06-30 NOTE — ED Notes (Signed)
Pt recovered, pt alert and talking with husband, cards at bedside talking with pt.

## 2020-06-30 NOTE — ED Notes (Signed)
Stopped dilt gtt per md instructions, pt currently a fib on the monitor with a rate around 105

## 2020-06-30 NOTE — Sedation Documentation (Signed)
Pt in bed, pt eyes open, pt moving all extremities, resps even and unlabored, pt denies pain, states that she didn't feel anything throughout the procedure.

## 2020-06-30 NOTE — Sedation Documentation (Signed)
Pt in bed, RT, MD Alvino Chapel, RN Darnelle Maffucci and Rachael at bedside for sedation for cardio version, pt on cardiac, O2 and co2 monitor, pt cardioverted with 150j once, pt now appears to be in sinus rhythm, pt arouses to verbal stim.

## 2020-06-30 NOTE — Telephone Encounter (Signed)
Called patient back to inform her [place] would like patient to come in sooner than 3:30pm today for nurse visit. Patient verbalized agreement and will come to the [facility] this morning.

## 2020-06-30 NOTE — Progress Notes (Signed)
Patient remains in afib after flecanide given   Rates better now after diltiazem  I would recomm  1   DCCV with 200 J synchronized biphasic energy 2.  Start Flecanide 75 mg bid.  Start tomorrow in am 3   Start Eliquis 5 mg bid 4  Increase metoprolol to 50 mg bid (she was on 25 bid) 5  Stop aspirin    Will arrange for echo to be done as well as f/u in clinic for EKG in 7 to 10 days     If she does not convert to sinus rhythm would  1  Increase metoprolol to 50 bid 2  Add Eliquis 5 bid  We will arrange other appts and follow up.  Dorris Carnes MD

## 2020-06-30 NOTE — Telephone Encounter (Addendum)
    What are her symptoms? Palpitations? Dyspnea? If able, I would see if she can come in for a nurse visit for an EKG today so we can confirm. Would check BP as well. If she cannot come in today, I would recommend we arrange a monitor for further evaluation as she has not been on anticoagulation given her last episode was 15+ years ago.   Signed, Erma Heritage, PA-C 06/30/2020, 8:56 AM Pager: [phone]

## 2020-06-30 NOTE — Telephone Encounter (Signed)
Pt returned call and will come for EKG today [place] @ 330pm - aware that if symptoms worsen to have ED evaluation

## 2020-06-30 NOTE — Progress Notes (Signed)
ANTICOAGULATION CONSULT NOTE - Initial Consult  Pharmacy Consult for Heparin Indication: atrial fibrillation  Allergies  Allergen Reactions  . Tetanus Toxoids Other (See Comments)    Reaction occurred more than 40 yrs ago--does not remember what occurred.    Patient Measurements: Height: 5\' 6"  (167.6 cm) Weight: 106.1 kg (234 lb) IBW/kg (Calculated) : 59.3 HEPARIN DW (KG): 83.7   Vital Signs: Temp: 98 F (36.7 C) (04/15 1141) BP: 113/67 (04/15 1141) Pulse Rate: 146 (04/15 1141)  Labs: No results for input(s): HGB, HCT, PLT, APTT, LABPROT, INR, HEPARINUNFRC, HEPRLOWMOCWT, CREATININE, CKTOTAL, CKMB, TROPONINIHS in the last 72 hours.  CrCl cannot be calculated (Patient's most recent lab result is older than the maximum 21 days allowed.).   Medical History: Past Medical History:  Diagnosis Date  . Arthritis   . Atrial fibrillation ([place])   . Cataract   . Congestive heart failure ([place])   . Dysrhythmia    AFib  . GERD (gastroesophageal reflux disease)   . Hemochromatosis   . Iron excess     Medications:  See med rec  Assessment: Patient with new onset afib. Pharmacy asked to start heparin. Patient not on oral anticoagulants prior to admission.   Goal of Therapy:  Heparin level 0.3-0.7 units/ml Monitor platelets by anticoagulation protocol: Yes   Plan:  Give 4000 units bolus x 1 Start heparin infusion at 1000 units/hr Check anti-Xa level in ~8 hours and daily while on heparin Continue to monitor H&H and platelets   Isac Sarna, BS Vena Austria, BCPS Clinical Pharmacist Pager [phone] 06/30/2020,11:58 AM

## 2020-06-30 NOTE — Telephone Encounter (Signed)
I will forward to B.Ahmed Prima, PA-C for recommendations

## 2020-06-30 NOTE — ED Triage Notes (Signed)
Pt states she went into afib last night at 2200. Saw cardiology today and was told to come to ED for eval.

## 2020-06-30 NOTE — Telephone Encounter (Signed)
Patient called said she seen [place] this past week and had not had any AFIB episodes. She said she believes one started last night and she is currently still in it. She would like her to call her back to let her know what she should do.

## 2020-06-30 NOTE — ED Notes (Addendum)
During sedation pt's iv appears to have infiltrated, when second rn attempted to push etomidate pt c/o pain at iv site, stopped administration and used alternative iv, after sedation assessed iv, iv will not draw and flush with pain, called pharmacy for suggestions of etomidate infiltration, per pharmacy, mark site, attempt to draw blood back, notify md, and warm compress.  If increased extravasation steroids may be needed.  No redness noted in area to [place], warm compress placed, asked pt to notify me of any pain at site, md notified

## 2020-06-30 NOTE — ED Provider Notes (Signed)
  Physical Exam  BP (!) 111/43 (BP Location: Left Arm)   Pulse 80   Temp (!) 97.5 F (36.4 C) (Oral)   Resp 18   Ht 5\' 6"  (1.676 m)   Wt 106.1 kg   SpO2 100%   BMI 37.77 kg/m   Physical Exam  ED Course/Procedures     .Cardioversion  Date/Time: 06/30/2020 5:00 PM Performed by: Davonna Belling, MD Authorized by: Davonna Belling, MD   Consent:    Consent obtained:  Written and verbal   Consent given by:  Patient   Risks discussed:  Cutaneous burn and death   Alternatives discussed:  No treatment, rate-control medication, alternative treatment, anti-coagulation medication and delayed treatment Pre-procedure details:    Cardioversion basis:  Elective   Rhythm:  Atrial fibrillation   Electrode placement:  Anterior-posterior Patient sedated: Yes. Refer to sedation procedure documentation for details of sedation.  Attempt one:    Cardioversion mode:  Synchronous   Waveform:  Biphasic   Shock (Joules):  150   Shock outcome:  Conversion to normal sinus rhythm Post-procedure details:    Patient status:  Awake .Sedation  Date/Time: 06/30/2020 5:00 PM Performed by: Davonna Belling, MD Authorized by: Davonna Belling, MD   Consent:    Consent obtained:  Verbal   Consent given by:  Patient   Risks discussed:  Allergic reaction and dysrhythmia   Alternatives discussed:  Anxiolysis and analgesia without sedation Universal protocol:    Immediately prior to procedure, a time out was called: yes     Patient identity confirmed:  Arm band and verbally with patient Indications:    Procedure performed:  Cardioversion Pre-sedation assessment:    Time since last food or drink:  6   ASA classification: class 2 - patient with mild systemic disease      MDM  Received patient in signout.  A. fib.  Acute onset last night.  Seen by cardiology.  No cardioversion after attempted chemical cardioversion.  Discussed with Dr. Harrington Challenger and patient will be electrically cardioverted in the  ER.  Will start flecainide increase metoprolol and started on Eliquis.  Cardioverted successfully with 150 J.  Discharge home.  Return to her baseline        Davonna Belling, MD 06/30/20 2303

## 2020-06-30 NOTE — Patient Instructions (Signed)
Sent to ED, room 3

## 2020-06-30 NOTE — Telephone Encounter (Signed)
    Will await EKG for further recommendations. If she develops any worsening chest tightness or dyspnea in the interim, would recommend emergent evaluation.  Signed, Erma Heritage, PA-C 06/30/2020, 9:17 AM Pager: [phone]

## 2020-07-07 ENCOUNTER — Ambulatory Visit (INDEPENDENT_AMBULATORY_CARE_PROVIDER_SITE_OTHER): Payer: Medicare PPO

## 2020-07-07 ENCOUNTER — Other Ambulatory Visit: Payer: Self-pay

## 2020-07-07 VITALS — BP 130/82 | HR 78 | Ht 66.0 in | Wt 237.0 lb

## 2020-07-07 DIAGNOSIS — I480 Paroxysmal atrial fibrillation: Secondary | ICD-10-CM

## 2020-07-07 NOTE — Progress Notes (Signed)
Seen in clinic today 1 week after cardioversion for Afib with RVR. Patient states she feels good w/o c/o irregular or fast heart rate.. Has started Flecainide 75 mg BID and Lopressor 50 mg BID.EKG done today,reviewed by B.Stader, PA-C.Patient was in NSR.Has f/u in 1 week.

## 2020-07-07 NOTE — Patient Instructions (Signed)
Keep follow up as scheduled next week.  No change in medications

## 2020-07-13 NOTE — Progress Notes (Signed)
Cardiology Office Note    Date:  07/14/2020   ID:  Amanda Mckay, Amanda Mckay [birth year], MRN [id number]  PCP:  Eulogio Bear, NP  Cardiologist: Dorris Carnes, MD    Chief Complaint  Patient presents with  . Follow-up    Recent DCCV    History of Present Illness:    Amanda Mckay is a 75 y.o. female with past medical history of paroxysmal atrial fibrillation, HLD, GERD and hemochromatosis who presents to the office today for follow-up from her recent Emergency Department visit.  She was examined by myself on 06/28/2020 following a recent admission for Bell's palsy and was overall doing well at the time of her visit. Her last episode of atrial fibrillation had been more than 15 years ago, therefore she was not on anticoagulation and had remained on ASA along with Lopressor 25 mg twice daily.  She called the office two days later reporting tachycardia and a nurse visit was arranged and EKG confirmed atrial fibrillation with RVR. Given that her symptoms had started within the past 24 hours, she was sent to the ED for likely cardioversion. She did not chemically convert with Flecainide 300mg , therefore underwent DCCV with 150 J and converted to NSR. Was recommended by Dr. Harrington Challenger that she be started on Eliquis 5 mg twice daily and be restarted on Flecainide at 75 mg twice daily (previously on Flecainide for 15+ years and tolerated well). Lopressor was also increased from 25 mg twice daily to 50 mg twice daily.  She did present for a nurse visit on 07/07/2020 and was maintaining normal sinus rhythm and reported overall feeling well.  In talking with the patient today, she reports overall doing well since her ED visit. She denies any recurrent palpitations or elevated heart rate. No recent chest pain, orthopnea, PND, dizziness or presyncope. She does experience occasional lower extremity edema and typically takes Lasix 20 mg once daily.   Past Medical History:  Diagnosis Date  .  Arthritis   . Atrial fibrillation ([place])   . Cataract   . Congestive heart failure ([place])   . Dysrhythmia    AFib  . GERD (gastroesophageal reflux disease)   . Hemochromatosis   . Iron excess     Past Surgical History:  Procedure Laterality Date  . CESAREAN SECTION    . KNEE ARTHROSCOPY WITH MEDIAL MENISECTOMY Right 01/28/2019   Procedure: RIGHT KNEE ARTHROSCOPY WITH MEDIAL MENISCECTOMY;  Surgeon: Carole Civil, MD;  Location: [facility];  Service: Orthopedics;  Laterality: Right;  . TONSILLECTOMY    . VENTRAL HERNIA REPAIR N/A 01/26/2018   Procedure: HERNIA REPAIR VENTRAL ADULT WITH MESH;  Surgeon: Virl Cagey, MD;  Location: [facility];  Service: General;  Laterality: N/A;    Current Medications: Outpatient Medications Prior to Visit  Medication Sig Dispense Refill  . acetaminophen (TYLENOL) 650 MG CR tablet Take 650-1,300 mg by mouth every 8 (eight) hours as needed for pain.    [name] atorvastatin (LIPITOR) 10 MG tablet Take 1 tablet (10 mg total) by mouth 3 (three) times a week. At bedtime. 30 tablet 3  . Calcium Carb-Cholecalciferol (CALCIUM 600+D3 PO) Take 1 tablet by mouth 2 (two) times daily.    . Cholecalciferol 25 MCG (1000 UT) capsule Take 1,000 Units by mouth daily.    . clotrimazole (LOTRIMIN AF) 1 % cream Apply 1 application topically 2 (two) times daily. (Patient taking differently: Apply 1 application topically 2 (two) times daily. Applied to toes twice daily for  fungus) 30 g 1  . furosemide (LASIX) 20 MG tablet TAKE ONE TABLET (20MG  TOTAL) BY MOUTH DAILY AS NEEDED (Patient taking differently: Take 20 mg by mouth daily.) 30 tablet 0  . magnesium oxide (MAG-OX) 400 MG tablet Take 400 mg by mouth daily.    . potassium chloride (KLOR-CON) 10 MEQ tablet Take 1 po daily with lasix for leg cramps (Patient taking differently: Take 10 mEq by mouth See admin instructions. with lasix for leg cramps) 30 tablet 2  . trolamine salicylate (ASPERCREME) 10 % cream Apply 1 application  topically 2 (two) times daily as needed for muscle pain.     [name] apixaban (ELIQUIS) 5 MG TABS tablet Take 1 tablet (5 mg total) by mouth 2 (two) times daily. 60 tablet 0  . flecainide (TAMBOCOR) 150 MG tablet Take 0.5 tablets (75 mg total) by mouth 2 (two) times daily. 30 tablet 0  . metoprolol tartrate (LOPRESSOR) 50 MG tablet Take 1 tablet (50 mg total) by mouth 2 (two) times daily. 60 tablet 0   No facility-administered medications prior to visit.     Allergies:   Tetanus toxoids   Social History   Socioeconomic History  . Marital status: Married    Spouse name: Not on file  . Number of children: Not on file  . Years of education: Not on file  . Highest education level: Not on file  Occupational History  . Not on file  Tobacco Use  . Smoking status: Never Smoker  . Smokeless tobacco: Never Used  Vaping Use  . Vaping Use: Never used  Substance and Sexual Activity  . Alcohol use: Yes    Alcohol/week: 2.0 standard drinks    Types: 2 Glasses of wine per week  . Drug use: No  . Sexual activity: Yes  Other Topics Concern  . Not on file  Social History Narrative   Retired Copywriter, advertising.   Married.   Has children 3. Grandchildren, 2.    Grew up in [place], [place].   Married for over 31 years.    Eats all food groups.   Just moved to [place], [place] in 8/18.    Social Determinants of Health   Financial Resource Strain: Not on file  Food Insecurity: Not on file  Transportation Needs: Not on file  Physical Activity: Not on file  Stress: Not on file  Social Connections: Not on file     Family History:  The patient's family history includes Arthritis in her brother; Atrial fibrillation in her son; Diabetes in her paternal grandfather; Stroke in her maternal grandmother and mother.   Review of Systems:    Please see the history of present illness.     All other systems reviewed and are otherwise negative except as noted above.   Physical Exam:    VS:  BP (!) 120/58    Pulse 79   Ht 5\' 6"  (1.676 m)   Wt 239 lb (108.4 kg)   SpO2 99%   BMI 38.58 kg/m    General: Well developed, well nourished,female appearing in no acute distress. Head: Normocephalic, atraumatic. Neck: No carotid bruits. JVD not elevated.  Lungs: Respirations regular and unlabored, without wheezes or rales.  Heart: Regular rate and rhythm. No S3 or S4.  No murmur, no rubs, or gallops appreciated. Abdomen: Appears non-distended. No obvious abdominal masses. Msk:  Strength and tone appear normal for age. No obvious joint deformities or effusions. Extremities: No clubbing or cyanosis. Trace ankle edema bilaterally.  Distal  pedal pulses are 2+ bilaterally. Neuro: Alert and oriented X 3. Moves all extremities spontaneously. No focal deficits noted. Psych:  Responds to questions appropriately with a normal affect. Skin: No rashes or lesions noted  Wt Readings from Last 3 Encounters:  07/14/20 239 lb (108.4 kg)  07/07/20 237 lb (107.5 kg)  06/30/20 234 lb (106.1 kg)     Studies/Labs Reviewed:   EKG:  EKG is not ordered today. EKG from 07/07/2020 is reviewed and shows NSR, HR 76 with no acute ST abnormalities when compared to prior tracings.   Recent Labs: 05/22/2020: Magnesium 1.9 06/30/2020: ALT 23; BUN 25; Creatinine, Ser 0.96; Hemoglobin 15.2; Platelets 200; Potassium 3.9; Sodium 142; TSH 3.161   Lipid Panel    Component Value Date/Time   CHOL 165 06/08/2020 0346   TRIG 131 06/08/2020 0346   HDL 40 (L) 06/08/2020 0346   CHOLHDL 4.1 06/08/2020 0346   VLDL 26 06/08/2020 0346   LDLCALC 99 06/08/2020 0346   LDLCALC 102 (H) 05/22/2020 1044    Additional studies/ records that were reviewed today include:   Echocardiogram: 05/2020 IMPRESSIONS    1. Left ventricular ejection fraction, by estimation, is 65 to 70%. The  left ventricle has normal function. The left ventricle has no regional  wall motion abnormalities. There is mild concentric left ventricular  hypertrophy. Left  ventricular diastolic  parameters are consistent with Grade I diastolic dysfunction (impaired  relaxation).  2. Right ventricular systolic function is normal. The right ventricular  size is normal. There is normal pulmonary artery systolic pressure.  3. The mitral valve is normal in structure. Trivial mitral valve  regurgitation. No evidence of mitral stenosis.  4. The aortic valve is normal in structure. Aortic valve regurgitation is  not visualized. No aortic stenosis is present.  5. The inferior vena cava is normal in size with greater than 50%  respiratory variability, suggesting right atrial pressure of 3 mmHg.   Assessment:    1. AF (paroxysmal atrial fibrillation) ([place])   2. Encounter for monitoring flecainide therapy   3. Current use of long term anticoagulation   4. Bilateral lower extremity edema      Plan:   In order of problems listed above:  1. Paroxysmal Atrial Fibrillation - She recently underwent DCCV while in the ED and converted to normal sinus rhythm. She denies any recurrent palpitations and is maintaining normal sinus rhythm by examination today and recent EKG last week. Will continue Lopressor 50 mg twice daily along with Flecainide 75 mg twice daily. Will plan for a GXT given recent reinitiation of Flecainide. She does think she will be able to walk on a treadmill and the focus would be to see how her rhythm and QRS interval respond with activity.   2. Use of Anticoagulation - She reports one episode of brown discoloration with urination but no recent recurrences. No specific melena, hematochezia or hematuria. Remains on Eliquis 5 mg twice daily. Would plan to recheck CBC and BMET in 3 months.   3. Lower Extremity Edema - Symptoms have overall been well-controlled. She remains on Lasix 20 mg daily along with potassium supplementation.  Creatinine was stable at 0.96 by recent labs earlier this month.   Medication Adjustments/Labs and Tests  Ordered: Current medicines are reviewed at length with the patient today.  Concerns regarding medicines are outlined above.  Medication changes, Labs and Tests ordered today are listed in the Patient Instructions below. Patient Instructions  Medication Instructions:  Your physician recommends that  you continue on your current medications as directed. Please refer to the Current Medication list given to you today.  *If you need a refill on your cardiac medications before your next appointment, please call your pharmacy*   Lab Work: None If you have labs (blood work) drawn today and your tests are completely normal, you will receive your results only by: [name] MyChart Message (if you have MyChart) OR . A paper copy in the mail If you have any lab test that is abnormal or we need to change your treatment, we will call you to review the results.   Testing/Procedures: Your physician has requested that you have an exercise tolerance test. For further information please visit [email or web address]. Please also follow instruction sheet, as given.     Follow-Up: At [facility], you and your health needs are our priority.  As part of our continuing mission to provide you with exceptional heart care, we have created designated Provider Care Teams.  These Care Teams include your primary Cardiologist (physician) and Advanced Practice Providers (APPs -  Physician Assistants and Nurse Practitioners) who all work together to provide you with the care you need, when you need it.  We recommend signing up for the patient portal called "MyChart".  Sign up information is provided on this After Visit Summary.  MyChart is used to connect with patients for Virtual Visits (Telemedicine).  Patients are able to view lab/test results, encounter notes, upcoming appointments, etc.  Non-urgent messages can be sent to your provider as well.   To learn more about what you can do with MyChart, go to [email or web address].     Your next appointment:   3-4 month(s)  The format for your next appointment:   In Person  Provider:   Bernerd Pho, PA-C or Dorris Carnes, MD   Other Instructions       Signed, Erma Heritage, PA-C  07/14/2020 5:08 PM    [facility] [address] [place], [place] [address] Phone: [phone] Fax: [phone]

## 2020-07-14 ENCOUNTER — Ambulatory Visit: Payer: Medicare PPO | Admitting: Student

## 2020-07-14 ENCOUNTER — Encounter: Payer: Self-pay | Admitting: Student

## 2020-07-14 ENCOUNTER — Other Ambulatory Visit: Payer: Self-pay

## 2020-07-14 VITALS — BP 120/58 | HR 79 | Ht 66.0 in | Wt 239.0 lb

## 2020-07-14 DIAGNOSIS — I480 Paroxysmal atrial fibrillation: Secondary | ICD-10-CM

## 2020-07-14 DIAGNOSIS — Z5181 Encounter for therapeutic drug level monitoring: Secondary | ICD-10-CM | POA: Diagnosis not present

## 2020-07-14 DIAGNOSIS — Z7901 Long term (current) use of anticoagulants: Secondary | ICD-10-CM | POA: Diagnosis not present

## 2020-07-14 DIAGNOSIS — Z79899 Other long term (current) drug therapy: Secondary | ICD-10-CM

## 2020-07-14 DIAGNOSIS — R600 Localized edema: Secondary | ICD-10-CM

## 2020-07-14 MED ORDER — METOPROLOL TARTRATE 50 MG PO TABS: 50.0000 mg | ORAL_TABLET | Freq: Two times a day (BID) | ORAL | 3 refills | Status: DC

## 2020-07-14 MED ORDER — FLECAINIDE ACETATE 150 MG PO TABS: 75.0000 mg | ORAL_TABLET | Freq: Two times a day (BID) | ORAL | 3 refills | Status: DC

## 2020-07-14 MED ORDER — APIXABAN 5 MG PO TABS: 5.0000 mg | ORAL_TABLET | Freq: Two times a day (BID) | ORAL | 3 refills | Status: DC

## 2020-07-14 NOTE — Patient Instructions (Addendum)
Medication Instructions:  Your physician recommends that you continue on your current medications as directed. Please refer to the Current Medication list given to you today.  *If you need a refill on your cardiac medications before your next appointment, please call your pharmacy*   Lab Work: None If you have labs (blood work) drawn today and your tests are completely normal, you will receive your results only by: [name] MyChart Message (if you have MyChart) OR . A paper copy in the mail If you have any lab test that is abnormal or we need to change your treatment, we will call you to review the results.   Testing/Procedures: Your physician has requested that you have an exercise tolerance test. For further information please visit [email or web address]. Please also follow instruction sheet, as given.     Follow-Up: At [facility], you and your health needs are our priority.  As part of our continuing mission to provide you with exceptional heart care, we have created designated Provider Care Teams.  These Care Teams include your primary Cardiologist (physician) and Advanced Practice Providers (APPs -  Physician Assistants and Nurse Practitioners) who all work together to provide you with the care you need, when you need it.  We recommend signing up for the patient portal called "MyChart".  Sign up information is provided on this After Visit Summary.  MyChart is used to connect with patients for Virtual Visits (Telemedicine).  Patients are able to view lab/test results, encounter notes, upcoming appointments, etc.  Non-urgent messages can be sent to your provider as well.   To learn more about what you can do with MyChart, go to [email or web address].    Your next appointment:   3-4 month(s)  The format for your next appointment:   In Person  Provider:   Bernerd Pho, PA-C or Dorris Carnes, MD   Other Instructions

## 2020-07-19 ENCOUNTER — Telehealth: Payer: Self-pay | Admitting: *Deleted

## 2020-07-19 NOTE — Telephone Encounter (Signed)
Called pt to inform the she needs to hold Metoprolol Thursday evening and Friday morning. No answer, left msg to return call.

## 2020-07-21 ENCOUNTER — Encounter (HOSPITAL_COMMUNITY): Payer: Medicare PPO

## 2020-07-21 ENCOUNTER — Other Ambulatory Visit (HOSPITAL_COMMUNITY): Payer: Medicare PPO

## 2020-07-31 ENCOUNTER — Other Ambulatory Visit: Payer: Self-pay | Admitting: Family Medicine

## 2020-08-31 ENCOUNTER — Other Ambulatory Visit: Payer: Self-pay | Admitting: Family Medicine

## 2020-09-01 NOTE — Telephone Encounter (Signed)
Pls call pt to est care visit for meds with NP

## 2020-09-04 ENCOUNTER — Other Ambulatory Visit: Payer: Self-pay

## 2020-09-04 NOTE — Telephone Encounter (Signed)
Appointment has already been scheduled for 7/7. Thank you.

## 2020-09-07 ENCOUNTER — Encounter: Payer: Self-pay | Admitting: Podiatry

## 2020-09-07 ENCOUNTER — Other Ambulatory Visit: Payer: Self-pay

## 2020-09-07 ENCOUNTER — Ambulatory Visit: Payer: Medicare PPO | Admitting: Podiatry

## 2020-09-07 DIAGNOSIS — B351 Tinea unguium: Secondary | ICD-10-CM

## 2020-09-07 DIAGNOSIS — L608 Other nail disorders: Secondary | ICD-10-CM | POA: Diagnosis not present

## 2020-09-07 DIAGNOSIS — M79675 Pain in left toe(s): Secondary | ICD-10-CM

## 2020-09-07 DIAGNOSIS — M79674 Pain in right toe(s): Secondary | ICD-10-CM

## 2020-09-07 NOTE — Progress Notes (Signed)
This patient returns to the office for evaluation and treatment of long thick painful nails .  This patient is unable to trim her own nails since the patient cannot reach her feet.  Patient says the nails are painful walking and wearing her shoes.  Sh e returns for preventive foot care services.  General Appearance  Alert, conversant and in no acute stress.  Vascular  Dorsalis pedis and posterior tibial  pulses are not  palpable due to swelling legs/feet.  bilaterally.  Capillary return is within normal limits  Bilaterally.Cold feet  Bilaterally.  Absent digital hair  B/L.  Neurologic  Senn-Weinstein monofilament wire test within normal limits  bilaterally. Muscle power within normal limits bilaterally.  Nails Thick disfigured discolored nails with subungual debris  from hallux to fifth toes bilaterally. No evidence of bacterial infection or drainage bilaterally. Pincer hallux nails  B/L.  Orthopedic  No limitations of motion  feet .  No crepitus or effusions noted.  No bony pathology or digital deformities noted.  Skin  normotropic skin with no porokeratosis noted bilaterally.  No signs of infections or ulcers noted.     Onychomycosis  Pain in toes right foot  Pain in toes left foot  Debridement  of nails  1-5  B/L with a nail nipper.  Nails were then filed using a dremel tool with no incidents.    RTC 10 weeks    Gardiner Barefoot DPM

## 2020-09-08 ENCOUNTER — Other Ambulatory Visit: Payer: Self-pay | Admitting: Family Medicine

## 2020-09-21 ENCOUNTER — Ambulatory Visit: Payer: Medicare PPO | Admitting: Nurse Practitioner

## 2020-09-21 ENCOUNTER — Other Ambulatory Visit: Payer: Self-pay

## 2020-09-21 VITALS — BP 142/82 | HR 77 | Temp 98.3°F | Ht 66.0 in | Wt 240.0 lb

## 2020-09-21 DIAGNOSIS — E559 Vitamin D deficiency, unspecified: Secondary | ICD-10-CM

## 2020-09-21 DIAGNOSIS — I5032 Chronic diastolic (congestive) heart failure: Secondary | ICD-10-CM | POA: Diagnosis not present

## 2020-09-21 DIAGNOSIS — I480 Paroxysmal atrial fibrillation: Secondary | ICD-10-CM

## 2020-09-21 DIAGNOSIS — I482 Chronic atrial fibrillation, unspecified: Secondary | ICD-10-CM

## 2020-09-21 DIAGNOSIS — I10 Essential (primary) hypertension: Secondary | ICD-10-CM

## 2020-09-21 DIAGNOSIS — K219 Gastro-esophageal reflux disease without esophagitis: Secondary | ICD-10-CM | POA: Diagnosis not present

## 2020-09-21 DIAGNOSIS — E782 Mixed hyperlipidemia: Secondary | ICD-10-CM

## 2020-09-21 MED ORDER — OMEPRAZOLE 40 MG PO CPDR: 40.0000 mg | DELAYED_RELEASE_CAPSULE | Freq: Every day | ORAL | 1 refills | Status: DC

## 2020-09-21 NOTE — Progress Notes (Signed)
Subjective:    Patient ID: Amanda Mckay, female    DOB: [birth year], 75 y.o.   MRN: [id number]  HPI: Amanda Mckay is a 75 y.o. female presenting for follow up.  Chief Complaint  Patient presents with   Follow-up    Follow up 64mos, has been in the hosp 2x sine last visit in March 2022 for afib and [facility] Had cardioversion in ED in April and has not had palpitations since.  Follows with cardiology every 3 months.  Last EKG showed normal sinus rhythm.  Maintains on Eliquis 5 mg twice daily, flecainide 75 mg twice daily, and metoprolol 50 mg twice daily.  No issues with his medication and no bleeding. Atrial fibrillation status: controlled Satisfied with current treatment: yes  Medication side effects:  no Medication compliance: excellent compliance Palpitations:  no Chest pain:  no Dyspnea on exertion:  no Orthopnea:  yes; occasionally she wakes up in the middle the night and has a hard time breathing Syncope:  no Edema:  yes Ventricular rate control: B-blocker Anti-coagulation: long acting   GERD Reports she took Pepcid in the past. GERD control status: uncontrolled Satisfied with current treatment? no Heartburn frequency: A few times weekly at night Medication side effects: Not currently on medication Medication compliance: N/A Previous GERD medications: Pepcid Antacid use frequency:  tums -at least daily Duration: minutes Nature: fullness is stomach and shortness of breath, belching Location: stomach Heartburn duration: Minutes Alleviatiating factors:  Tums,  Aggravating factors: Laying flat at night, certain foods Dysphagia: no Odynophagia:  no Hematemesis: no Blood in stool: no EGD: no   HYPERTENSION / HYPERLIPIDEMIA Currently taking metoprolol 50 mg twice daily.  Takes Lasix as needed, mostly every day.  "Not too big on atorvastatin," taking a few times weekly maybe. Satisfied with current treatment? yes Duration of  hypertension: chronic BP monitoring frequency: not checking BP medication side effects: no Duration of hyperlipidemia: chronic Cholesterol medication side effects: yes Aspirin: no Recent stressors: no Recurrent headaches: no Visual changes: no Palpitations: no Dyspnea: no Chest pain: no Lower extremity edema: yes Dizzy/lightheaded: no  Allergies  Allergen Reactions   Tetanus Toxoids Other (See Comments)    Reaction occurred more than 40 yrs ago--does not remember what occurred.    Outpatient Encounter Medications as of 09/21/2020  Medication Sig   acetaminophen (TYLENOL) 650 MG CR tablet Take 650-1,300 mg by mouth every 8 (eight) hours as needed for pain.   apixaban (ELIQUIS) 5 MG TABS tablet Take 1 tablet (5 mg total) by mouth 2 (two) times daily.   atorvastatin (LIPITOR) 10 MG tablet TAKE ONE TABLET (10MG  TOTAL) BY MOUTH THREE TIMES A WEEK AT BEDTIME   Calcium Carb-Cholecalciferol (CALCIUM 600+D3 PO) Take 1 tablet by mouth 2 (two) times daily.   Cholecalciferol 25 MCG (1000 UT) capsule Take 1,000 Units by mouth daily.   clotrimazole (LOTRIMIN AF) 1 % cream Apply 1 application topically 2 (two) times daily. (Patient taking differently: Apply 1 application topically 2 (two) times daily. Applied to toes twice daily for fungus)   diclofenac (CATAFLAM) 50 MG tablet Take 50 mg by mouth 2 (two) times daily.   flecainide (TAMBOCOR) 150 MG tablet Take 0.5 tablets (75 mg total) by mouth 2 (two) times daily.   furosemide (LASIX) 20 MG tablet TAKE ONE TABLET (20MG  TOTAL) BY MOUTH DAILY AS NEEDED   magnesium oxide (MAG-OX) 400 MG tablet Take 400 mg by mouth daily.   metoprolol tartrate (LOPRESSOR)  50 MG tablet Take 1 tablet (50 mg total) by mouth 2 (two) times daily.   omeprazole (PRILOSEC) 40 MG capsule Take 1 capsule (40 mg total) by mouth daily.   potassium chloride (KLOR-CON) 10 MEQ tablet TAKE ONE (1) TABLET BY MOUTH EVERY DAY WITH LASIX FOR LEG CRAMPS   trolamine salicylate (ASPERCREME)  10 % cream Apply 1 application topically 2 (two) times daily as needed for muscle pain.    No facility-administered encounter medications on file as of 09/21/2020.    Patient Active Problem List   Diagnosis Date Noted   Gastroesophageal reflux disease 09/23/2020   Facial droop 06/07/2020   AF (paroxysmal atrial fibrillation) ([place]) 06/07/2020   RLS (restless legs syndrome) 01/04/2020   Osteopenia 12/31/2019   Compression fracture of L1 lumbar vertebra ([place]) 12/06/2019   Elevated blood-pressure reading, without diagnosis of hypertension 12/06/2019   Spondylolisthesis at L4-L5 level 12/06/2019   Class 3 obesity 09/01/2019   Pincer nail deformity 06/10/2019   S/P right knee arthroscopy 01/28/2019 03/08/2019   Derangement of posterior horn of medial meniscus of right knee    Primary osteoarthritis of right knee    Pain due to onychomycosis of toenails of both feet 12/07/2018   Peripheral edema 12/01/2018   Essential hypertension    Chronic diastolic HF (heart failure) (HCC)    Ventral hernia without obstruction or gangrene 08/01/2017   Hyperlipidemia 02/27/2017   Vitamin D deficiency 02/27/2017   Hereditary hemochromatosis ([place]) 02/27/2017   Atrial fibrillation, chronic 02/27/2017    Past Medical History:  Diagnosis Date   Arthritis    Atrial fibrillation (HCC)    Cataract    Congestive heart failure (HCC)    Dysrhythmia    AFib   GERD (gastroesophageal reflux disease)    Hemochromatosis    Iron excess     Relevant past medical, surgical, family and social history reviewed and updated as indicated. Interim medical history since our last visit reviewed.  Review of Systems Per HPI unless specifically indicated above     Objective:    BP (!) 142/82 (BP Location: Right Arm, Cuff Size: Normal)   Pulse 77   Temp 98.3 F (36.8 C)   Ht 5\' 6"  (1.676 m)   Wt 240 lb (108.9 kg)   SpO2 93%   BMI 38.74 kg/m   Wt Readings from Last 3 Encounters:  09/21/20 240 lb (108.9 kg)   07/14/20 239 lb (108.4 kg)  07/07/20 237 lb (107.5 kg)    Physical Exam Vitals and nursing note reviewed.  Constitutional:      General: She is not in acute distress.    Appearance: Normal appearance. She is obese. She is not toxic-appearing.  HENT:     Head: Normocephalic and atraumatic.  Eyes:     General: No scleral icterus.    Extraocular Movements: Extraocular movements intact.  Neck:     Vascular: No carotid bruit.  Cardiovascular:     Rate and Rhythm: Normal rate and regular rhythm.     Heart sounds: Normal heart sounds. No murmur heard. Pulmonary:     Effort: Pulmonary effort is normal. No respiratory distress.     Breath sounds: Normal breath sounds. No wheezing, rhonchi or rales.  Abdominal:     General: Abdomen is flat. Bowel sounds are normal.     Palpations: Abdomen is soft.     Tenderness: There is no abdominal tenderness.  Musculoskeletal:        General: Normal range of motion.  Cervical back: Normal range of motion.     Right lower leg: No edema.     Left lower leg: No edema.  Skin:    General: Skin is warm and dry.     Capillary Refill: Capillary refill takes less than 2 seconds.     Coloration: Skin is not jaundiced.     Findings: No bruising or erythema.  Neurological:     General: No focal deficit present.     Mental Status: She is alert and oriented to person, place, and time.     Motor: No weakness.     Gait: Gait normal.  Psychiatric:        Mood and Affect: Mood normal.        Behavior: Behavior normal.        Thought Content: Thought content normal.        Judgment: Judgment normal.      Assessment & Plan:   Problem List Items Addressed This Visit       Cardiovascular and Mediastinum   AF (paroxysmal atrial fibrillation) (HCC) (Chronic)    Chronic.  Follows closely with cardiology -continue collaboration.  Heart rate is controlled today.  Continue flecainide, metoprolol, and Eliquis at current dosing.  Follow-up in 6 months or  sooner with any palpitations or chest pain.       Essential hypertension    Chronic, elevated above goal today in clinic.  Encouraged patient to check blood pressure at home and notify us with readings in 1 week.  Goal less than 140/90 given comorbid diseases.  We will check kidney function with electrolytes when patient returns for fasting blood work.  Follow-up in 2 months.       Relevant Orders   CBC with Differential   COMPLETE METABOLIC PANEL WITH GFR   Chronic diastolic HF (heart failure) (HCC) - Primary    Chronic.  Appears euvolemic on exam today.  Continue current medications including metoprolol and Lasix as needed for swelling.  We will check electrolytes with kidney function and blood counts when patient returns for fasting lab work.       Relevant Orders   COMPLETE METABOLIC PANEL WITH GFR     Digestive   Gastroesophageal reflux disease    Orthopnea symptoms sound most consistent with acid reflux-we will start trial of omeprazole before first meal of day for 8 weeks and monitor for benefit.  Follow-up in 8 weeks.       Relevant Medications   omeprazole (PRILOSEC) 40 MG capsule   Other Relevant Orders   CBC with Differential     Other   Vitamin D deficiency    We will check vitamin D level when patient returns for fasting blood work-she is due for monitoring.  Follow-up 6 months.       Relevant Orders   COMPLETE METABOLIC PANEL WITH GFR   VITAMIN D 25 Hydroxy (Vit-D Deficiency, Fractures)   Hyperlipidemia    Chronic.  We will check lipids and patient returns for fasting blood work.  Plan to continue atorvastatin 10 mg 3 times weekly-discussed importance.  Discussed The 10-year ASCVD risk score Mikey Bussing DC Jr., et al., 2013) is: 22.5%   Values used to calculate the score:     Age: 65 years     Sex: Female     Is Non-Hispanic African American: No     Diabetic: No     Tobacco smoker: No     Systolic Blood Pressure: 810 mmHg  Is BP treated: Yes     HDL  Cholesterol: 40 mg/dL     Total Cholesterol: 165 mg/dL Encouraged diet low in saturated fat and increase physical activity-goal is 30 minutes 5 times weekly.  Follow-up in 6 months.       Relevant Orders   Lipid Panel     Follow up plan: Return in about 8 weeks (around 11/16/2020).

## 2020-09-23 ENCOUNTER — Encounter: Payer: Self-pay | Admitting: Nurse Practitioner

## 2020-09-23 DIAGNOSIS — K219 Gastro-esophageal reflux disease without esophagitis: Secondary | ICD-10-CM | POA: Insufficient documentation

## 2020-09-23 NOTE — Assessment & Plan Note (Signed)
Chronic, elevated above goal today in clinic.  Encouraged patient to check blood pressure at home and notify us with readings in 1 week.  Goal less than 140/90 given comorbid diseases.  We will check kidney function with electrolytes when patient returns for fasting blood work.  Follow-up in 2 months.

## 2020-09-23 NOTE — Assessment & Plan Note (Signed)
Chronic.  Follows closely with cardiology -continue collaboration.  Heart rate is controlled today.  Continue flecainide, metoprolol, and Eliquis at current dosing.  Follow-up in 6 months or sooner with any palpitations or chest pain.

## 2020-09-23 NOTE — Assessment & Plan Note (Signed)
Chronic.  Appears euvolemic on exam today.  Continue current medications including metoprolol and Lasix as needed for swelling.  We will check electrolytes with kidney function and blood counts when patient returns for fasting lab work.

## 2020-09-23 NOTE — Assessment & Plan Note (Signed)
Orthopnea symptoms sound most consistent with acid reflux-we will start trial of omeprazole before first meal of day for 8 weeks and monitor for benefit.  Follow-up in 8 weeks.

## 2020-09-23 NOTE — Assessment & Plan Note (Signed)
Chronic.  We will check lipids and patient returns for fasting blood work.  Plan to continue atorvastatin 10 mg 3 times weekly-discussed importance.  Discussed The 10-year ASCVD risk score Mikey Bussing DC Brooke Bonito., et al., 2013) is: 22.5%   Values used to calculate the score:     Age: 75 years     Sex: Female     Is Non-Hispanic African American: No     Diabetic: No     Tobacco smoker: No     Systolic Blood Pressure: 063 mmHg     Is BP treated: Yes     HDL Cholesterol: 40 mg/dL     Total Cholesterol: 165 mg/dL Encouraged diet low in saturated fat and increase physical activity-goal is 30 minutes 5 times weekly.  Follow-up in 6 months.

## 2020-09-23 NOTE — Assessment & Plan Note (Signed)
We will check vitamin D level when patient returns for fasting blood work-she is due for monitoring.  Follow-up 6 months.

## 2020-10-13 ENCOUNTER — Ambulatory Visit
Admission: EM | Admit: 2020-10-13 | Discharge: 2020-10-13 | Disposition: A | Discharge status: Home / Self Care | Payer: Medicare PPO | Attending: Emergency Medicine | Admitting: Emergency Medicine

## 2020-10-13 ENCOUNTER — Other Ambulatory Visit: Payer: Medicare PPO

## 2020-10-13 ENCOUNTER — Encounter: Payer: Self-pay | Admitting: *Deleted

## 2020-10-13 ENCOUNTER — Other Ambulatory Visit: Payer: Self-pay

## 2020-10-13 DIAGNOSIS — L0201 Cutaneous abscess of face: Secondary | ICD-10-CM | POA: Diagnosis not present

## 2020-10-13 DIAGNOSIS — I10 Essential (primary) hypertension: Secondary | ICD-10-CM

## 2020-10-13 DIAGNOSIS — E782 Mixed hyperlipidemia: Secondary | ICD-10-CM

## 2020-10-13 DIAGNOSIS — I5032 Chronic diastolic (congestive) heart failure: Secondary | ICD-10-CM

## 2020-10-13 DIAGNOSIS — E559 Vitamin D deficiency, unspecified: Secondary | ICD-10-CM

## 2020-10-13 DIAGNOSIS — K219 Gastro-esophageal reflux disease without esophagitis: Secondary | ICD-10-CM

## 2020-10-13 LAB — CBC WITH DIFFERENTIAL/PLATELET
Absolute Monocytes: 496 cells/uL (ref 200–950)
Basophils Absolute: 53 cells/uL (ref 0–200)
Basophils Relative: 0.9 %
Eosinophils Absolute: 212 cells/uL (ref 15–500)
Eosinophils Relative: 3.6 %
HCT: 43 % (ref 35.0–45.0)
Hemoglobin: 14.4 g/dL (ref 11.7–15.5)
Lymphs Abs: 1746 cells/uL (ref 850–3900)
MCH: 32.1 pg (ref 27.0–33.0)
MCHC: 33.5 g/dL (ref 32.0–36.0)
MCV: 95.8 fL (ref 80.0–100.0)
MPV: 10.4 fL (ref 7.5–12.5)
Monocytes Relative: 8.4 %
Neutro Abs: 3393 cells/uL (ref 1500–7800)
Neutrophils Relative %: 57.5 %
Platelets: 175 10*3/uL (ref 140–400)
RBC: 4.49 10*6/uL (ref 3.80–5.10)
RDW: 11.7 % (ref 11.0–15.0)
Total Lymphocyte: 29.6 %
WBC: 5.9 10*3/uL (ref 3.8–10.8)

## 2020-10-13 LAB — COMPLETE METABOLIC PANEL WITH GFR
AG Ratio: 1.7 (calc) (ref 1.0–2.5)
ALT: 11 U/L (ref 6–29)
AST: 14 U/L (ref 10–35)
Albumin: 4.1 g/dL (ref 3.6–5.1)
Alkaline phosphatase (APISO): 78 U/L (ref 37–153)
BUN/Creatinine Ratio: 33 (calc) — ABNORMAL HIGH (ref 6–22)
BUN: 27 mg/dL — ABNORMAL HIGH (ref 7–25)
CO2: 26 mmol/L (ref 20–32)
Calcium: 9.4 mg/dL (ref 8.6–10.4)
Chloride: 108 mmol/L (ref 98–110)
Creat: 0.82 mg/dL (ref 0.60–1.00)
Globulin: 2.4 g/dL (calc) (ref 1.9–3.7)
Glucose, Bld: 91 mg/dL (ref 65–99)
Potassium: 4.7 mmol/L (ref 3.5–5.3)
Sodium: 142 mmol/L (ref 135–146)
Total Bilirubin: 0.6 mg/dL (ref 0.2–1.2)
Total Protein: 6.5 g/dL (ref 6.1–8.1)
eGFR: 75 mL/min/{1.73_m2} (ref >=60)

## 2020-10-13 LAB — LIPID PANEL
Cholesterol: 172 mg/dL (ref <200)
HDL: 50 mg/dL (ref >=50)
LDL Cholesterol (Calc): 99 mg/dL (calc)
Non-HDL Cholesterol (Calc): 122 mg/dL (calc) (ref <130)
Total CHOL/HDL Ratio: 3.4 (calc) (ref <5.0)
Triglycerides: 131 mg/dL (ref <150)

## 2020-10-13 LAB — VITAMIN D 25 HYDROXY (VIT D DEFICIENCY, FRACTURES): Vit D, 25-Hydroxy: 51 ng/mL (ref 30–100)

## 2020-10-13 MED ORDER — DOXYCYCLINE HYCLATE 100 MG PO CAPS: 100.0000 mg | ORAL_CAPSULE | Freq: Two times a day (BID) | ORAL | 0 refills | Status: DC

## 2020-10-13 MED ORDER — MUPIROCIN 2 % EX OINT: 1.0000 "application " | TOPICAL_OINTMENT | Freq: Two times a day (BID) | CUTANEOUS | 0 refills | Status: DC

## 2020-10-13 NOTE — ED Triage Notes (Signed)
Pt reports abscess to Lt side of face since yesterday.

## 2020-10-13 NOTE — ED Provider Notes (Signed)
[facility]   [id number] 10/13/20 Arrival Time: 0958   [id number]  SUBJECTIVE:  Amanda Mckay is a 75 y.o. female who presents with a possible abscess of her LT cheek x 1 day.  Denies injury or trauma.  Denies alleviating factors.  Sore to the touch.  Reports hx of skin cancer.  Denies fever, chills, nausea, vomiting.  Reports associated redness and swelling.  Has appt with derm on 8/15.  Was unable to be evaluated by PCP today.    ROS: As per HPI.  All other pertinent ROS negative.     Past Medical History:  Diagnosis Date   Arthritis    Atrial fibrillation ([place])    Cataract    Congestive heart failure (HCC)    Dysrhythmia    AFib   GERD (gastroesophageal reflux disease)    Hemochromatosis    Iron excess    Past Surgical History:  Procedure Laterality Date   CESAREAN SECTION     KNEE ARTHROSCOPY WITH MEDIAL MENISECTOMY Right 01/28/2019   Procedure: RIGHT KNEE ARTHROSCOPY WITH MEDIAL MENISCECTOMY;  Surgeon: Carole Civil, MD;  Location: [facility];  Service: Orthopedics;  Laterality: Right;   TONSILLECTOMY     VENTRAL HERNIA REPAIR N/A 01/26/2018   Procedure: HERNIA REPAIR VENTRAL ADULT WITH MESH;  Surgeon: Virl Cagey, MD;  Location: [facility];  Service: General;  Laterality: N/A;   Allergies  Allergen Reactions   Tetanus Toxoids Other (See Comments)    Reaction occurred more than 40 yrs ago--does not remember what occurred.   No current facility-administered medications on file prior to encounter.   Current Outpatient Medications on File Prior to Encounter  Medication Sig Dispense Refill   acetaminophen (TYLENOL) 650 MG CR tablet Take 650-1,300 mg by mouth every 8 (eight) hours as needed for pain.     apixaban (ELIQUIS) 5 MG TABS tablet Take 1 tablet (5 mg total) by mouth 2 (two) times daily. 180 tablet 3   atorvastatin (LIPITOR) 10 MG tablet TAKE ONE TABLET ('10MG'$  TOTAL) BY MOUTH THREE TIMES A WEEK AT BEDTIME 30 tablet 0   Calcium  Carb-Cholecalciferol (CALCIUM 600+D3 PO) Take 1 tablet by mouth 2 (two) times daily.     Cholecalciferol 25 MCG (1000 UT) capsule Take 1,000 Units by mouth daily.     clotrimazole (LOTRIMIN AF) 1 % cream Apply 1 application topically 2 (two) times daily. (Patient taking differently: Apply 1 application topically 2 (two) times daily. Applied to toes twice daily for fungus) 30 g 1   diclofenac (CATAFLAM) 50 MG tablet Take 50 mg by mouth 2 (two) times daily.     flecainide (TAMBOCOR) 150 MG tablet Take 0.5 tablets (75 mg total) by mouth 2 (two) times daily. 90 tablet 3   furosemide (LASIX) 20 MG tablet TAKE ONE TABLET ('20MG'$  TOTAL) BY MOUTH DAILY AS NEEDED 30 tablet 0   magnesium oxide (MAG-OX) 400 MG tablet Take 400 mg by mouth daily.     metoprolol tartrate (LOPRESSOR) 50 MG tablet Take 1 tablet (50 mg total) by mouth 2 (two) times daily. 180 tablet 3   omeprazole (PRILOSEC) 40 MG capsule Take 1 capsule (40 mg total) by mouth daily. 60 capsule 1   potassium chloride (KLOR-CON) 10 MEQ tablet TAKE ONE (1) TABLET BY MOUTH EVERY DAY WITH LASIX FOR LEG CRAMPS 30 tablet 2   trolamine salicylate (ASPERCREME) 10 % cream Apply 1 application topically 2 (two) times daily as needed for muscle pain.  Social History   Socioeconomic History   Marital status: Married    Spouse name: Not on file   Number of children: Not on file   Years of education: Not on file   Highest education level: Not on file  Occupational History   Not on file  Tobacco Use   Smoking status: Never   Smokeless tobacco: Never  Vaping Use   Vaping Use: Never used  Substance and Sexual Activity   Alcohol use: Yes    Alcohol/week: 2.0 standard drinks    Types: 2 Glasses of wine per week   Drug use: No   Sexual activity: Yes  Other Topics Concern   Not on file  Social History Narrative   Retired Copywriter, advertising.   Married.   Has children 3. Grandchildren, 2.    Grew up in [place], [place].   Married for over 31 years.     Eats all food groups.   Just moved to [place], [place] in 8/18.    Social Determinants of Health   Financial Resource Strain: Not on file  Food Insecurity: Not on file  Transportation Needs: Not on file  Physical Activity: Not on file  Stress: Not on file  Social Connections: Not on file  Intimate Partner Violence: Not on file   Family History  Problem Relation Age of Onset   Stroke Mother    Atrial fibrillation Son    Stroke Maternal Grandmother    Diabetes Paternal Grandfather    Arthritis Brother     OBJECTIVE:  Vitals:   10/13/20 1009  BP: (!) 144/76  Pulse: 77  Resp: 18  Temp: 99 F (37.2 C)  TempSrc: Oral  SpO2: 95%     General appearance: alert; no distress Skin: erythematous papule apx 0.25 in diameter with scabbing and overlying erythema, tender to touch; no active drainage Psychological: alert and cooperative; normal mood and affect   ASSESSMENT & PLAN:  1. Abscess of face     Meds ordered this encounter  Medications   mupirocin ointment (BACTROBAN) 2 %    Sig: Apply 1 application topically 2 (two) times daily.    Dispense:  22 g    Refill:  0    Order Specific Question:   Supervising Provider    Answer:   Raylene Everts [id number]   doxycycline (VIBRAMYCIN) 100 MG capsule    Sig: Take 1 capsule (100 mg total) by mouth 2 (two) times daily.    Dispense:  20 capsule    Refill:  0    Order Specific Question:   Supervising Provider    Answer:   Raylene Everts [id number]    We will cover for possible infection today Wash site daily with warm water and mild soap Keep covered to avoid friction Bactroban ointment prescribed Take antibiotic as prescribed and to completion Follow up here or with PCP or dermatology for further evaluation.  I am suspicious this may be skin cancer and may need to be biopsied.   Return or go to the ED if you have any new or worsening symptoms increased redness, swelling, pain, nausea, vomiting, fever, chills, etc...      Reviewed expectations re: course of current medical issues. Questions answered. Outlined signs and symptoms indicating need for more acute intervention. Patient verbalized understanding. After Visit Summary given.           Lestine Box, PA-C 10/13/20 1037

## 2020-10-13 NOTE — Discharge Instructions (Addendum)
We will cover for possible infection today Wash site daily with warm water and mild soap Keep covered to avoid friction Bactroban ointment prescribed Take antibiotic as prescribed and to completion Follow up here or with PCP or dermatology for further evaluation.  I am suspicious this may be skin cancer and may need to be biopsied.   Return or go to the ED if you have any new or worsening symptoms increased redness, swelling, pain, nausea, vomiting, fever, chills, etc..[name]

## 2020-10-18 ENCOUNTER — Other Ambulatory Visit: Payer: Self-pay | Admitting: Orthopedic Surgery

## 2020-10-27 ENCOUNTER — Other Ambulatory Visit: Payer: Self-pay | Admitting: Nurse Practitioner

## 2020-10-30 ENCOUNTER — Ambulatory Visit
Admission: EM | Admit: 2020-10-30 | Discharge: 2020-10-30 | Disposition: A | Discharge status: Home / Self Care | Payer: Medicare PPO | Attending: Family Medicine | Admitting: Family Medicine

## 2020-10-30 ENCOUNTER — Other Ambulatory Visit: Payer: Self-pay

## 2020-10-30 ENCOUNTER — Encounter: Payer: Self-pay | Admitting: Emergency Medicine

## 2020-10-30 DIAGNOSIS — B028 Zoster with other complications: Secondary | ICD-10-CM

## 2020-10-30 MED ORDER — VALACYCLOVIR HCL 1 G PO TABS: 1000.0000 mg | ORAL_TABLET | Freq: Two times a day (BID) | ORAL | 0 refills | Status: DC

## 2020-10-30 MED ORDER — PREDNISONE 20 MG PO TABS: 20.0000 mg | ORAL_TABLET | Freq: Every day | ORAL | 0 refills | Status: AC

## 2020-10-30 NOTE — ED Provider Notes (Signed)
[facility]    CSN: [id number] Arrival date & time: 10/30/20  0913      History   Chief Complaint Chief Complaint  Patient presents with   Rash    HPI Amanda Mckay is a 75 y.o. female.   HPI Patient presents today for evaluation of a rash on her left lower back which developed x2 days ago which is irritated and red. She was concerned that it may be shingles.  She has not been vaccinated against shingles. She has applied cortisone cream which did not alleviate the irritation or itching.  Seems to be more raised and reddened. She denies any fever.  She did have Bell's palsy a couple months ago.  Denies any outdoor work or any suspected insect bites. Past Medical History:  Diagnosis Date   Arthritis    Atrial fibrillation (HCC)    Cataract    Congestive heart failure (HCC)    Dysrhythmia    AFib   GERD (gastroesophageal reflux disease)    Hemochromatosis    Iron excess     Patient Active Problem List   Diagnosis Date Noted   Gastroesophageal reflux disease 09/23/2020   Facial droop 06/07/2020   AF (paroxysmal atrial fibrillation) ([place]) 06/07/2020   RLS (restless legs syndrome) 01/04/2020   Osteopenia 12/31/2019   Compression fracture of L1 lumbar vertebra ([place]) 12/06/2019   Elevated blood-pressure reading, without diagnosis of hypertension 12/06/2019   Spondylolisthesis at L4-L5 level 12/06/2019   Class 3 obesity 09/01/2019   Pincer nail deformity 06/10/2019   S/P right knee arthroscopy 01/28/2019 03/08/2019   Derangement of posterior horn of medial meniscus of right knee    Primary osteoarthritis of right knee    Pain due to onychomycosis of toenails of both feet 12/07/2018   Peripheral edema 12/01/2018   Essential hypertension    Chronic diastolic HF (heart failure) ([place])    Ventral hernia without obstruction or gangrene 08/01/2017   Hyperlipidemia 02/27/2017   Vitamin D deficiency 02/27/2017   Hereditary hemochromatosis ([place]) 02/27/2017    Atrial fibrillation, chronic 02/27/2017    Past Surgical History:  Procedure Laterality Date   CESAREAN SECTION     KNEE ARTHROSCOPY WITH MEDIAL MENISECTOMY Right 01/28/2019   Procedure: RIGHT KNEE ARTHROSCOPY WITH MEDIAL MENISCECTOMY;  Surgeon: Carole Civil, MD;  Location: [facility];  Service: Orthopedics;  Laterality: Right;   TONSILLECTOMY     VENTRAL HERNIA REPAIR N/A 01/26/2018   Procedure: HERNIA REPAIR VENTRAL ADULT WITH MESH;  Surgeon: Virl Cagey, MD;  Location: [facility];  Service: General;  Laterality: N/A;    OB History     Gravida      Para      Term      Preterm      AB      Living  3      SAB      IAB      Ectopic      Multiple      Live Births               Home Medications    Prior to Admission medications   Medication Sig Start Date End Date Taking? Authorizing Provider  predniSONE (DELTASONE) 20 MG tablet Take 1 tablet (20 mg total) by mouth daily with breakfast for 5 days. 10/30/20 11/04/20 Yes Scot Jun, FNP  valACYclovir (VALTREX) 1000 MG tablet Take 1 tablet (1,000 mg total) by mouth 2 (two) times daily for 14 days. 10/30/20 11/13/20  Yes Scot Jun, FNP  acetaminophen (TYLENOL) 650 MG CR tablet Take 650-1,300 mg by mouth every 8 (eight) hours as needed for pain.    [provider]  apixaban (ELIQUIS) 5 MG TABS tablet Take 1 tablet (5 mg total) by mouth 2 (two) times daily. 07/14/20 07/09/21  Strader, Fransisco Hertz, PA-C  atorvastatin (LIPITOR) 10 MG tablet TAKE ONE TABLET ('10MG'$  TOTAL) BY MOUTH THREE TIMES A WEEK AT BEDTIME 09/04/20   Susy Frizzle, MD  Calcium Carb-Cholecalciferol (CALCIUM 600+D3 PO) Take 1 tablet by mouth 2 (two) times daily.    [provider]  Cholecalciferol 25 MCG (1000 UT) capsule Take 1,000 Units by mouth daily.    [provider]  clotrimazole (LOTRIMIN AF) 1 % cream Apply 1 application topically 2 (two) times daily. Patient taking differently: Apply 1 application  topically 2 (two) times daily. Applied to toes twice daily for fungus 12/28/18   Susy Frizzle, MD  diclofenac (CATAFLAM) 50 MG tablet TAKE ONE TABLET ('50MG'$  TOTAL) BY MOUTH TWO TIMES DAILY 10/18/20   Carole Civil, MD  doxycycline (VIBRAMYCIN) 100 MG capsule Take 1 capsule (100 mg total) by mouth 2 (two) times daily. 10/13/20   Wurst, [place], PA-C  flecainide (TAMBOCOR) 150 MG tablet Take 0.5 tablets (75 mg total) by mouth 2 (two) times daily. 07/14/20   Strader, Fransisco Hertz, PA-C  furosemide (LASIX) 20 MG tablet TAKE ONE (1) TABLET BY MOUTH EVERY DAY AS NEEDED 10/27/20   Noemi Chapel A, NP  magnesium oxide (MAG-OX) 400 MG tablet Take 400 mg by mouth daily.    [provider]  metoprolol tartrate (LOPRESSOR) 50 MG tablet Take 1 tablet (50 mg total) by mouth 2 (two) times daily. 07/14/20   Strader, Fransisco Hertz, PA-C  mupirocin ointment (BACTROBAN) 2 % Apply 1 application topically 2 (two) times daily. 10/13/20   Wurst, [place], PA-C  omeprazole (PRILOSEC) 40 MG capsule Take 1 capsule (40 mg total) by mouth daily. 09/21/20   Eulogio Bear, NP  potassium chloride (KLOR-CON) 10 MEQ tablet TAKE ONE (1) TABLET BY MOUTH EVERY DAY WITH LASIX FOR LEG CRAMPS 07/31/20   Susy Frizzle, MD  trolamine salicylate (ASPERCREME) 10 % cream Apply 1 application topically 2 (two) times daily as needed for muscle pain.     [provider]    Family History Family History  Problem Relation Age of Onset   Stroke Mother    Atrial fibrillation Son    Stroke Maternal Grandmother    Diabetes Paternal Grandfather    Arthritis Brother     Social History Social History   Tobacco Use   Smoking status: Never   Smokeless tobacco: Never  Vaping Use   Vaping Use: Never used  Substance Use Topics   Alcohol use: Yes    Alcohol/week: 2.0 standard drinks    Types: 2 Glasses of wine per week   Drug use: No     Allergies   Tetanus toxoids   Review of Systems Review of  Systems Pertinent negatives listed in HPI Physical Exam Triage Vital Signs ED Triage Vitals [10/30/20 1000]  Enc Vitals Group     BP (!) 158/94     Pulse Rate 86     Resp 18     Temp 98.4 F (36.9 C)     Temp Source Oral     SpO2 95 %     Weight      Height      Head Circumference  Peak Flow      Pain Score 5     Pain Loc      Pain Edu?      Excl. in [place]?    No data found.  Updated Vital Signs BP (!) 158/94 (BP Location: Right Arm)   Pulse 86   Temp 98.4 F (36.9 C) (Oral)   Resp 18   SpO2 95%   Visual Acuity Right Eye Distance:   Left Eye Distance:   Bilateral Distance:    Right Eye Near:   Left Eye Near:    Bilateral Near:     Physical Exam Vitals reviewed.  Constitutional:      Appearance: Normal appearance.  Cardiovascular:     Rate and Rhythm: Normal rate and regular rhythm.  Pulmonary:     Effort: Pulmonary effort is normal.     Breath sounds: Normal breath sounds.  Musculoskeletal:       Back:  Neurological:     Mental Status: She is alert.     UC Treatments / Results  Labs (all labs ordered are listed, but only abnormal results are displayed) Labs Reviewed - No data to display  EKG   Radiology No results found.  Procedures Procedures (including critical care time)  Medications Ordered in UC Medications - No data to display  Initial Impression / Assessment and Plan / UC Course  I have reviewed the triage vital signs and the nursing notes.  Pertinent labs & imaging results that were available during my care of the patient were reviewed by me and considered in my medical decision making (see chart for details).    Given apparent treatment of suspected shingles.   Treatment per discharge instructions. Strict return precautions given if rash progresses or does not readily improve following completion of medication therapy.  Final Clinical Impressions(s) / UC Diagnoses   Final diagnoses:  Herpes zoster with other  complication   Discharge Instructions   None    ED Prescriptions     Medication Sig Dispense Auth. Provider   valACYclovir (VALTREX) 1000 MG tablet Take 1 tablet (1,000 mg total) by mouth 2 (two) times daily for 14 days. 28 tablet Scot Jun, FNP   predniSONE (DELTASONE) 20 MG tablet Take 1 tablet (20 mg total) by mouth daily with breakfast for 5 days. 5 tablet Scot Jun, FNP      PDMP not reviewed this encounter.   Scot Jun, FNP 10/30/20 [phone]

## 2020-10-30 NOTE — ED Triage Notes (Signed)
Pt presents today with c/o of rash to left lower back x 2-3 days. +pain She believes it is "shingles". She applied Hydrocortisone cream with no relief.

## 2020-11-13 ENCOUNTER — Telehealth: Payer: Medicare PPO | Admitting: Nurse Practitioner

## 2020-11-13 ENCOUNTER — Encounter: Payer: Self-pay | Admitting: Nurse Practitioner

## 2020-11-13 ENCOUNTER — Other Ambulatory Visit: Payer: Self-pay

## 2020-11-13 ENCOUNTER — Other Ambulatory Visit: Payer: Medicare PPO

## 2020-11-13 DIAGNOSIS — N3001 Acute cystitis with hematuria: Secondary | ICD-10-CM

## 2020-11-13 LAB — MICROSCOPIC MESSAGE

## 2020-11-13 LAB — URINALYSIS, ROUTINE W REFLEX MICROSCOPIC
Bilirubin Urine: NEGATIVE
Glucose, UA: NEGATIVE
Hyaline Cast: NONE SEEN /LPF
Ketones, ur: NEGATIVE
Nitrite: POSITIVE — AB
Protein, ur: NEGATIVE
Specific Gravity, Urine: 1.02 (ref 1.001–1.035)
pH: 7 (ref 5.0–8.0)

## 2020-11-13 MED ORDER — CEPHALEXIN 500 MG PO CAPS: 500.0000 mg | ORAL_CAPSULE | Freq: Four times a day (QID) | ORAL | 0 refills | Status: AC

## 2020-11-13 NOTE — Progress Notes (Signed)
Subjective:    Patient ID: Amanda Mckay, female    DOB: [birth year], 75 y.o.   MRN: [id number]  HPI: Amanda Mckay is a 75 y.o. female presenting virtually for urinary tract infection symptoms   Chief Complaint  Patient presents with   Urinary Tract Infection   URINARY SYMPTOMS Duration: days - since Saturday Dysuria: yes; burning at the end of urination Urinary frequency: yes Urgency: yes Small volume voids: yes Symptom severity: moderate Urinary incontinence: yes Foul odor: yes Hematuria: yes; yesterday better now Abdominal pain: no Back pain: no Suprapubic pain/pressure: yes Flank pain: no Fever:  no Nausea: no Vomiting: no Relief with cranberry juice: not tried Relief with pyridium: not tried Status: better Previous urinary tract infection: no Recurrent urinary tract infection: no Sexual activity: Sexually active with 1 female partner History of sexually transmitted disease: no Vaginal discharge: no Treatments attempted: no  Allergies  Allergen Reactions   Tetanus Toxoids Other (See Comments)    Reaction occurred more than 40 yrs ago--does not remember what occurred.    Outpatient Encounter Medications as of 11/13/2020  Medication Sig   cephALEXin (KEFLEX) 500 MG capsule Take 1 capsule (500 mg total) by mouth every 6 (six) hours for 5 days.   acetaminophen (TYLENOL) 650 MG CR tablet Take 650-1,300 mg by mouth every 8 (eight) hours as needed for pain.   apixaban (ELIQUIS) 5 MG TABS tablet Take 1 tablet (5 mg total) by mouth 2 (two) times daily.   atorvastatin (LIPITOR) 10 MG tablet TAKE ONE TABLET ('10MG'$  TOTAL) BY MOUTH THREE TIMES A WEEK AT BEDTIME   Calcium Carb-Cholecalciferol (CALCIUM 600+D3 PO) Take 1 tablet by mouth 2 (two) times daily.   Cholecalciferol 25 MCG (1000 UT) capsule Take 1,000 Units by mouth daily.   clotrimazole (LOTRIMIN AF) 1 % cream Apply 1 application topically 2 (two) times daily. (Patient taking differently: Apply 1  application topically 2 (two) times daily. Applied to toes twice daily for fungus)   diclofenac (CATAFLAM) 50 MG tablet TAKE ONE TABLET ('50MG'$  TOTAL) BY MOUTH TWO TIMES DAILY   flecainide (TAMBOCOR) 150 MG tablet Take 0.5 tablets (75 mg total) by mouth 2 (two) times daily.   furosemide (LASIX) 20 MG tablet TAKE ONE (1) TABLET BY MOUTH EVERY DAY AS NEEDED   magnesium oxide (MAG-OX) 400 MG tablet Take 400 mg by mouth daily.   metoprolol tartrate (LOPRESSOR) 50 MG tablet Take 1 tablet (50 mg total) by mouth 2 (two) times daily.   mupirocin ointment (BACTROBAN) 2 % Apply 1 application topically 2 (two) times daily.   omeprazole (PRILOSEC) 40 MG capsule Take 1 capsule (40 mg total) by mouth daily.   potassium chloride (KLOR-CON) 10 MEQ tablet TAKE ONE (1) TABLET BY MOUTH EVERY DAY WITH LASIX FOR LEG CRAMPS   trolamine salicylate (ASPERCREME) 10 % cream Apply 1 application topically 2 (two) times daily as needed for muscle pain.    [DISCONTINUED] doxycycline (VIBRAMYCIN) 100 MG capsule Take 1 capsule (100 mg total) by mouth 2 (two) times daily.   [DISCONTINUED] valACYclovir (VALTREX) 1000 MG tablet Take 1 tablet (1,000 mg total) by mouth 2 (two) times daily for 14 days.   No facility-administered encounter medications on file as of 11/13/2020.    Patient Active Problem List   Diagnosis Date Noted   Gastroesophageal reflux disease 09/23/2020   Facial droop 06/07/2020   AF (paroxysmal atrial fibrillation) ([place]) 06/07/2020   RLS (restless legs syndrome) 01/04/2020   Osteopenia 12/31/2019  Compression fracture of L1 lumbar vertebra (HCC) 12/06/2019   Elevated blood-pressure reading, without diagnosis of hypertension 12/06/2019   Spondylolisthesis at L4-L5 level 12/06/2019   Class 3 obesity 09/01/2019   Pincer nail deformity 06/10/2019   S/P right knee arthroscopy 01/28/2019 03/08/2019   Derangement of posterior horn of medial meniscus of right knee    Primary osteoarthritis of right knee    Pain  due to onychomycosis of toenails of both feet 12/07/2018   Peripheral edema 12/01/2018   Essential hypertension    Chronic diastolic HF (heart failure) (HCC)    Ventral hernia without obstruction or gangrene 08/01/2017   Hyperlipidemia 02/27/2017   Vitamin D deficiency 02/27/2017   Hereditary hemochromatosis ([place]) 02/27/2017   Atrial fibrillation, chronic 02/27/2017    Past Medical History:  Diagnosis Date   Arthritis    Atrial fibrillation (HCC)    Cataract    Congestive heart failure (HCC)    Dysrhythmia    AFib   GERD (gastroesophageal reflux disease)    Hemochromatosis    Iron excess     Relevant past medical, surgical, family and social history reviewed and updated as indicated. Interim medical history since our last visit reviewed.  Review of Systems Per HPI unless specifically indicated above     Objective:    There were no vitals taken for this visit.  Wt Readings from Last 3 Encounters:  09/21/20 240 lb (108.9 kg)  07/14/20 239 lb (108.4 kg)  07/07/20 237 lb (107.5 kg)    Physical Exam Physical examination unable to be performed due to lack of equipment.  Patient talking in complete sentences during telemedicine visit.     Assessment & Plan:  1. Acute cystitis with hematuria Acute x days.  Classic symptoms for UTI today.  No red flag today.  Patient will come to the office to drop off urine culture and start cephalexin in the meantime.  Recently treated for abscess with doxycycline and shingles with valcyclovir and prednisone, will dose cephalexin 4 times daily for 5 days.  With new nausea/vomiting, or back pain and unable to keep fluids down, seek emergent care.  - cephALEXin (KEFLEX) 500 MG capsule; Take 1 capsule (500 mg total) by mouth every 6 (six) hours for 5 days.  Dispense: 20 capsule; Refill: 0 - Urinalysis, Routine w reflex microscopic - Urine Culture  Follow up plan: Return if symptoms worsen or fail to improve.  This visit was completed via  telephone due to the restrictions of the COVID-19 pandemic. All issues as above were discussed and addressed but no physical exam was performed. If it was felt that the patient should be evaluated in the office, they were directed there. The patient verbally consented to this visit. Patient was unable to complete an audio/visual visit due to Lack of equipment. Location of the patient: home Location of the provider: work Those involved with this call:  Provider: Noemi Chapel, DNP, FNP-C CMA: n/a Front Desk/Registration: Vevelyn Pat  Time spent on call:  8 minutes on the phone discussing health concerns. 12 minutes total spent in review of patient's record and preparation of their chart. I verified patient identity using two factors (patient name and date of birth). Patient consents verbally to being seen via telemedicine visit today.

## 2020-11-16 ENCOUNTER — Ambulatory Visit: Payer: Medicare PPO | Admitting: Nurse Practitioner

## 2020-11-16 ENCOUNTER — Other Ambulatory Visit: Payer: Self-pay

## 2020-11-16 ENCOUNTER — Ambulatory Visit (INDEPENDENT_AMBULATORY_CARE_PROVIDER_SITE_OTHER): Payer: Medicare PPO | Admitting: Podiatry

## 2020-11-16 ENCOUNTER — Encounter: Payer: Self-pay | Admitting: Podiatry

## 2020-11-16 DIAGNOSIS — M79675 Pain in left toe(s): Secondary | ICD-10-CM | POA: Diagnosis not present

## 2020-11-16 DIAGNOSIS — B351 Tinea unguium: Secondary | ICD-10-CM

## 2020-11-16 DIAGNOSIS — M79674 Pain in right toe(s): Secondary | ICD-10-CM | POA: Diagnosis not present

## 2020-11-16 DIAGNOSIS — L608 Other nail disorders: Secondary | ICD-10-CM

## 2020-11-16 LAB — URINE CULTURE
MICRO NUMBER:: 12303981
SPECIMEN QUALITY:: ADEQUATE

## 2020-11-16 NOTE — Progress Notes (Signed)
This patient returns to the office for evaluation and treatment of long thick painful nails .  This patient is unable to trim her own nails since the patient cannot reach her feet.  Patient says the nails are painful walking and wearing her shoes.  Sh e returns for preventive foot care services.  General Appearance  Alert, conversant and in no acute stress.  Vascular  Dorsalis pedis and posterior tibial  pulses are not  palpable due to swelling legs/feet.  bilaterally.  Capillary return is within normal limits  Bilaterally.Cold feet  Bilaterally.  Absent digital hair  B/L.  Neurologic  Senn-Weinstein monofilament wire test within normal limits  bilaterally. Muscle power within normal limits bilaterally.  Nails Thick disfigured discolored nails with subungual debris  from hallux to fifth toes bilaterally. No evidence of bacterial infection or drainage bilaterally. Pincer hallux nails  B/L.  Orthopedic  No limitations of motion  feet .  No crepitus or effusions noted.  No bony pathology or digital deformities noted.  Skin  normotropic skin with no porokeratosis noted bilaterally.  No signs of infections or ulcers noted.     Onychomycosis  Pain in toes right foot  Pain in toes left foot  Debridement  of nails  1-5  B/L with a nail nipper.  Nails were then filed using a dremel tool with no incidents.    RTC 10 weeks    Gardiner Barefoot DPM

## 2020-11-22 NOTE — Progress Notes (Signed)
Subjective:    Patient ID: Amanda Mckay, female    DOB: [birth year], 75 y.o.   MRN: [id number]  HPI: Amanda Mckay is a 75 y.o. female presenting for blood pressure and acid reflux follow-up.  Chief Complaint  Patient presents with   Follow-up    Is not fasting   HYPERTENSION Currently taking metoprolol tartrate 50 mg twice daily, Lasix 20 mg daily, flecainide 75 mg twice daily.   Hypertension status: better  Satisfied with current treatment? yes Duration of hypertension: chronic BP monitoring frequency:  not currently checking BP medication side effects:  no Medication compliance: excellent Aspirin: no Recurrent headaches: no Visual changes: no Palpitations: no Dyspnea: no Chest pain: no Lower extremity edema: no Dizzy/lightheaded: no  GERD Reports omeprazole was helping at first.  However, not as much anymore.  Has been eating more tomatoes/acidic food recently.  This does seem to trigger her.  Has a history of ventral hernia repair in 2019.  Does not think she has ever had an EGD. GERD control status: stable Satisfied with current treatment? no Medication side effects: no  Medication compliance: excellent Dysphagia: yes Odynophagia:  no Hematemesis: no Blood in stool: no EGD: no  Allergies  Allergen Reactions   Tetanus Toxoids Other (See Comments)    Reaction occurred more than 40 yrs ago--does not remember what occurred.    Outpatient Encounter Medications as of 11/23/2020  Medication Sig   acetaminophen (TYLENOL) 650 MG CR tablet Take 650-1,300 mg by mouth every 8 (eight) hours as needed for pain.   apixaban (ELIQUIS) 5 MG TABS tablet Take 1 tablet (5 mg total) by mouth 2 (two) times daily.   atorvastatin (LIPITOR) 10 MG tablet TAKE ONE TABLET ('10MG'$  TOTAL) BY MOUTH THREE TIMES A WEEK AT BEDTIME   Calcium Carb-Cholecalciferol (CALCIUM 600+D3 PO) Take 1 tablet by mouth 2 (two) times daily.   Cholecalciferol 25 MCG (1000 UT) capsule Take 1,000  Units by mouth daily.   clotrimazole (LOTRIMIN AF) 1 % cream Apply 1 application topically 2 (two) times daily. (Patient taking differently: Apply 1 application topically 2 (two) times daily. Applied to toes twice daily for fungus)   diclofenac (CATAFLAM) 50 MG tablet TAKE ONE TABLET ('50MG'$  TOTAL) BY MOUTH TWO TIMES DAILY   flecainide (TAMBOCOR) 150 MG tablet Take 0.5 tablets (75 mg total) by mouth 2 (two) times daily.   furosemide (LASIX) 20 MG tablet TAKE ONE (1) TABLET BY MOUTH EVERY DAY AS NEEDED   magnesium oxide (MAG-OX) 400 MG tablet Take 400 mg by mouth daily.   metoprolol tartrate (LOPRESSOR) 50 MG tablet Take 1 tablet (50 mg total) by mouth 2 (two) times daily.   omeprazole (PRILOSEC) 40 MG capsule Take 1 capsule (40 mg total) by mouth daily.   potassium chloride (KLOR-CON) 10 MEQ tablet TAKE ONE (1) TABLET BY MOUTH EVERY DAY WITH LASIX FOR LEG CRAMPS   trolamine salicylate (ASPERCREME) 10 % cream Apply 1 application topically 2 (two) times daily as needed for muscle pain.    [DISCONTINUED] mupirocin ointment (BACTROBAN) 2 % Apply 1 application topically 2 (two) times daily. (Patient not taking: Reported on 11/23/2020)   No facility-administered encounter medications on file as of 11/23/2020.    Patient Active Problem List   Diagnosis Date Noted   Gastroesophageal reflux disease 09/23/2020   Facial droop 06/07/2020   AF (paroxysmal atrial fibrillation) ([place]) 06/07/2020   RLS (restless legs syndrome) 01/04/2020   Osteopenia 12/31/2019   Compression fracture of L1 lumbar  vertebra ([place]) 12/06/2019   Spondylolisthesis at L4-L5 level 12/06/2019   Class 3 obesity 09/01/2019   Pincer nail deformity 06/10/2019   S/P right knee arthroscopy 01/28/2019 03/08/2019   Derangement of posterior horn of medial meniscus of right knee    Primary osteoarthritis of right knee    Pain due to onychomycosis of toenails of both feet 12/07/2018   Peripheral edema 12/01/2018   Essential hypertension     Chronic diastolic HF (heart failure) (HCC)    Ventral hernia without obstruction or gangrene 08/01/2017   Hyperlipidemia 02/27/2017   Vitamin D deficiency 02/27/2017   Hereditary hemochromatosis ([place]) 02/27/2017   Atrial fibrillation, chronic 02/27/2017    Past Medical History:  Diagnosis Date   Arthritis    Atrial fibrillation (HCC)    Cataract    Congestive heart failure (HCC)    Dysrhythmia    AFib   GERD (gastroesophageal reflux disease)    Hemochromatosis    Iron excess     Relevant past medical, surgical, family and social history reviewed and updated as indicated. Interim medical history since our last visit reviewed.  Review of Systems Per HPI unless specifically indicated above     Objective:    BP 130/74   Pulse 72   Temp 98.7 F (37.1 C) (Temporal)   Resp 14   Ht '5\' 6"'$  (1.676 m)   Wt 245 lb (111.1 kg)   SpO2 96%   BMI 39.54 kg/m   Wt Readings from Last 3 Encounters:  11/23/20 245 lb (111.1 kg)  09/21/20 240 lb (108.9 kg)  07/14/20 239 lb (108.4 kg)    Physical Exam Vitals and nursing note reviewed.  Constitutional:      General: She is not in acute distress.    Appearance: Normal appearance. She is obese. She is not toxic-appearing.  HENT:     Head: Normocephalic and atraumatic.  Eyes:     General: No scleral icterus.    Extraocular Movements: Extraocular movements intact.  Neck:     Vascular: No carotid bruit.  Cardiovascular:     Rate and Rhythm: Normal rate and regular rhythm.     Heart sounds: Normal heart sounds. No murmur heard. Pulmonary:     Effort: Pulmonary effort is normal. No respiratory distress.     Breath sounds: Normal breath sounds. No wheezing, rhonchi or rales.  Abdominal:     General: Abdomen is flat. Bowel sounds are normal. There is no distension.     Palpations: Abdomen is soft.     Tenderness: There is no abdominal tenderness.  Musculoskeletal:     Cervical back: Normal range of motion.  Skin:    General: Skin is  warm and dry.     Capillary Refill: Capillary refill takes less than 2 seconds.     Coloration: Skin is not jaundiced.     Findings: No bruising or erythema.  Neurological:     General: No focal deficit present.     Mental Status: She is alert and oriented to person, place, and time.     Motor: No weakness.     Gait: Gait normal.  Psychiatric:        Mood and Affect: Mood normal.        Behavior: Behavior normal.        Thought Content: Thought content normal.        Judgment: Judgment normal.      Assessment & Plan:   Problem List Items Addressed This Visit  Cardiovascular and Mediastinum   Essential hypertension - Primary    Chronic.  Blood pressure is at goal today in clinic.  Plan to continue current medications including flecainide 75 mg twice daily, furosemide 20 mg daily, metoprolol 50 mg twice daily.  Follow-up in 3 months.        Digestive   Gastroesophageal reflux disease    Acute on chronic.  We will plan to continue omeprazole 40 mg daily and work on dietary changes.  Also discussed elevating head of bed, not laying down for at least 1 hour after eating, etc.  If symptoms persist or worsen, we will consider referral to GI for possible EGD.        Follow up plan: Return in about 3 months (around 02/22/2021) for follow up.

## 2020-11-23 ENCOUNTER — Other Ambulatory Visit: Payer: Self-pay

## 2020-11-23 ENCOUNTER — Encounter: Payer: Self-pay | Admitting: Nurse Practitioner

## 2020-11-23 ENCOUNTER — Ambulatory Visit (INDEPENDENT_AMBULATORY_CARE_PROVIDER_SITE_OTHER): Payer: Medicare PPO | Admitting: Nurse Practitioner

## 2020-11-23 VITALS — BP 130/74 | HR 72 | Temp 98.7°F | Resp 14 | Ht 66.0 in | Wt 245.0 lb

## 2020-11-23 DIAGNOSIS — I10 Essential (primary) hypertension: Secondary | ICD-10-CM | POA: Diagnosis not present

## 2020-11-23 DIAGNOSIS — K219 Gastro-esophageal reflux disease without esophagitis: Secondary | ICD-10-CM

## 2020-12-02 NOTE — Assessment & Plan Note (Addendum)
Chronic.  Blood pressure is at goal today in clinic.  Plan to continue current medications including flecainide 75 mg twice daily, furosemide 20 mg daily, metoprolol 50 mg twice daily.  Follow-up in 3 months.

## 2020-12-02 NOTE — Assessment & Plan Note (Signed)
Acute on chronic.  We will plan to continue omeprazole 40 mg daily and work on dietary changes.  Also discussed elevating head of bed, not laying down for at least 1 hour after eating, etc.  If symptoms persist or worsen, we will consider referral to GI for possible EGD.

## 2020-12-12 ENCOUNTER — Other Ambulatory Visit: Payer: Self-pay | Admitting: Family Medicine

## 2020-12-12 ENCOUNTER — Other Ambulatory Visit: Payer: Self-pay | Admitting: Nurse Practitioner

## 2020-12-13 ENCOUNTER — Other Ambulatory Visit: Payer: Self-pay

## 2020-12-13 ENCOUNTER — Ambulatory Visit: Payer: Medicare PPO | Admitting: Student

## 2020-12-13 ENCOUNTER — Encounter: Payer: Self-pay | Admitting: Student

## 2020-12-13 VITALS — BP 122/80 | HR 77 | Ht 66.0 in | Wt 243.6 lb

## 2020-12-13 DIAGNOSIS — E782 Mixed hyperlipidemia: Secondary | ICD-10-CM

## 2020-12-13 DIAGNOSIS — Z5181 Encounter for therapeutic drug level monitoring: Secondary | ICD-10-CM

## 2020-12-13 DIAGNOSIS — K219 Gastro-esophageal reflux disease without esophagitis: Secondary | ICD-10-CM

## 2020-12-13 DIAGNOSIS — I480 Paroxysmal atrial fibrillation: Secondary | ICD-10-CM

## 2020-12-13 DIAGNOSIS — Z79899 Other long term (current) drug therapy: Secondary | ICD-10-CM | POA: Diagnosis not present

## 2020-12-13 DIAGNOSIS — R600 Localized edema: Secondary | ICD-10-CM

## 2020-12-13 NOTE — Patient Instructions (Signed)
Medication Instructions:  Your physician recommends that you continue on your current medications as directed. Please refer to the Current Medication list given to you today.  *If you need a refill on your cardiac medications before your next appointment, please call your pharmacy*   Lab Work: NONE   If you have labs (blood work) drawn today and your tests are completely normal, you will receive your results only by: [place] (if you have MyChart) OR A paper copy in the mail If you have any lab test that is abnormal or we need to change your treatment, we will call you to review the results.   Testing/Procedures: NONE    Follow-Up: At [facility], you and your health needs are our priority.  As part of our continuing mission to provide you with exceptional heart care, we have created designated Provider Care Teams.  These Care Teams include your primary Cardiologist (physician) and Advanced Practice Providers (APPs -  Physician Assistants and Nurse Practitioners) who all work together to provide you with the care you need, when you need it.  We recommend signing up for the patient portal called "MyChart".  Sign up information is provided on this After Visit Summary.  MyChart is used to connect with patients for Virtual Visits (Telemedicine).  Patients are able to view lab/test results, encounter notes, upcoming appointments, etc.  Non-urgent messages can be sent to your provider as well.   To learn more about what you can do with MyChart, go to [email or web address].    Your next appointment:   6 month(s)  The format for your next appointment:   In Person  Provider:   Dorris Carnes, MD or Bernerd Pho, PA-C   Other Instructions Thank you for choosing [facility]!

## 2020-12-13 NOTE — Progress Notes (Signed)
Cardiology Office Note    Date:  12/13/2020   ID:  Mckay, Amanda 12-Feb-1946, MRN [id number]  PCP:  Eulogio Bear, NP  Cardiologist: Dorris Carnes, MD    Chief Complaint  Patient presents with   Follow-up    5 month visit    History of Present Illness:    Amanda Mckay is a 75 y.o. female with past medical history of paroxysmal atrial fibrillation, HLD, GERD and hemochromatosis who presents to the office today for 20-month follow-up.   She was last examined by myself in 06/2020 following a recent Emergency Department visit for atrial fibrillation with RVR during which she underwent DCCV with successful return to NSR. Was restarted on Flecainide while in the ED with Eliquis 5mg  BID being initiated and Lopressor titrated to 50mg  BID. At the time of follow-up, she reported overall feeling well and denied any recurrent palpitations. She was continued on her current cardiac medications with plans for a GXT given recent re-initiation of Flecainide.   In talking with the patient today, she reports overall feeling well from a cardiac perspective since her last visit. She denies any recent palpitations or chest pain. Reports that her dyspnea has improved over the past several months. No reported orthopnea or PND. She does take Lasix 20 mg daily and reports this typically controls her lower extremity edema. She did have a bladder infection earlier this month and was also treated for shingles but is now recovering well.   Past Medical History:  Diagnosis Date   Arthritis    Atrial fibrillation ([place])    Cataract    Congestive heart failure (HCC)    Dysrhythmia    AFib   GERD (gastroesophageal reflux disease)    Hemochromatosis    Iron excess     Past Surgical History:  Procedure Laterality Date   CESAREAN SECTION     KNEE ARTHROSCOPY WITH MEDIAL MENISECTOMY Right 01/28/2019   Procedure: RIGHT KNEE ARTHROSCOPY WITH MEDIAL MENISCECTOMY;  Surgeon: Carole Civil,  MD;  Location: [facility];  Service: Orthopedics;  Laterality: Right;   TONSILLECTOMY     VENTRAL HERNIA REPAIR N/A 01/26/2018   Procedure: HERNIA REPAIR VENTRAL ADULT WITH MESH;  Surgeon: Virl Cagey, MD;  Location: [facility];  Service: General;  Laterality: N/A;    Current Medications: Outpatient Medications Prior to Visit  Medication Sig Dispense Refill   acetaminophen (TYLENOL) 650 MG CR tablet Take 650-1,300 mg by mouth every 8 (eight) hours as needed for pain.     apixaban (ELIQUIS) 5 MG TABS tablet Take 1 tablet (5 mg total) by mouth 2 (two) times daily. 180 tablet 3   atorvastatin (LIPITOR) 10 MG tablet TAKE ONE TABLET (10MG  TOTAL) BY MOUTH THREE TIMES A WEEK AT BEDTIME 30 tablet 0   Calcium Carb-Cholecalciferol (CALCIUM 600+D3 PO) Take 1 tablet by mouth 2 (two) times daily.     Cholecalciferol 25 MCG (1000 UT) capsule Take 1,000 Units by mouth daily.     clotrimazole (LOTRIMIN AF) 1 % cream Apply 1 application topically 2 (two) times daily. (Patient taking differently: Apply 1 application topically 2 (two) times daily. Applied to toes twice daily for fungus) 30 g 1   diclofenac (CATAFLAM) 50 MG tablet TAKE ONE TABLET (50MG  TOTAL) BY MOUTH TWO TIMES DAILY 60 tablet 2   flecainide (TAMBOCOR) 150 MG tablet Take 0.5 tablets (75 mg total) by mouth 2 (two) times daily. 90 tablet 3   furosemide (LASIX) 20 MG tablet TAKE  ONE (1) TABLET BY MOUTH EVERY DAY AS NEEDED 30 tablet 0   magnesium oxide (MAG-OX) 400 MG tablet Take 400 mg by mouth daily.     metoprolol tartrate (LOPRESSOR) 50 MG tablet Take 1 tablet (50 mg total) by mouth 2 (two) times daily. 180 tablet 3   omeprazole (PRILOSEC) 40 MG capsule Take 1 capsule (40 mg total) by mouth daily. 60 capsule 1   potassium chloride (KLOR-CON) 10 MEQ tablet TAKE ONE (1) TABLET BY MOUTH EVERY DAY WITH LASIX FOR LEG CRAMPS 30 tablet 2   trolamine salicylate (ASPERCREME) 10 % cream Apply 1 application topically 2 (two) times daily as needed for muscle  pain.      No facility-administered medications prior to visit.     Allergies:   Tetanus toxoids   Social History   Socioeconomic History   Marital status: Married    Spouse name: Not on file   Number of children: Not on file   Years of education: Not on file   Highest education level: Not on file  Occupational History   Not on file  Tobacco Use   Smoking status: Never   Smokeless tobacco: Never  Vaping Use   Vaping Use: Never used  Substance and Sexual Activity   Alcohol use: Yes    Alcohol/week: 2.0 standard drinks    Types: 2 Glasses of wine per week   Drug use: No   Sexual activity: Yes  Other Topics Concern   Not on file  Social History Narrative   Retired Copywriter, advertising.   Married.   Has children 3. Grandchildren, 2.    Grew up in [place], [place].   Married for over 31 years.    Eats all food groups.   Just moved to [place], [place] in 8/18.    Social Determinants of Health   Financial Resource Strain: Not on file  Food Insecurity: Not on file  Transportation Needs: Not on file  Physical Activity: Not on file  Stress: Not on file  Social Connections: Not on file     Family History:  The patient's family history includes Arthritis in her brother; Atrial fibrillation in her son; Diabetes in her paternal grandfather; Stroke in her maternal grandmother and mother.   Review of Systems:    Please see the history of present illness.     All other systems reviewed and are otherwise negative except as noted above.   Physical Exam:    VS:  BP 122/80   Pulse 77   Ht 5\' 6"  (1.676 m)   Wt 243 lb 9.6 oz (110.5 kg)   SpO2 96%   BMI 39.32 kg/m    General: Well developed, well nourished,female appearing in no acute distress. Head: Normocephalic, atraumatic. Neck: No carotid bruits. JVD not elevated.  Lungs: Respirations regular and unlabored, without wheezes or rales.  Heart: Regular rate and rhythm. No S3 or S4.  No murmur, no rubs, or gallops  appreciated. Abdomen: Appears non-distended. No obvious abdominal masses. Msk:  Strength and tone appear normal for age. No obvious joint deformities or effusions. Extremities: No clubbing or cyanosis. No pitting edema.  Distal pedal pulses are 2+ bilaterally. Neuro: Alert and oriented X 3. Moves all extremities spontaneously. No focal deficits noted. Psych:  Responds to questions appropriately with a normal affect. Skin: No rashes or lesions noted  Wt Readings from Last 3 Encounters:  12/13/20 243 lb 9.6 oz (110.5 kg)  11/23/20 245 lb (111.1 kg)  09/21/20 240 lb (108.9  kg)     Studies/Labs Reviewed:   EKG:  EKG is ordered today.  The ekg ordered today demonstrates NSR, HR 77 with no acute ST changes when compared to prior tracings.   Recent Labs: 05/22/2020: Magnesium 1.9 06/30/2020: TSH 3.161 10/13/2020: ALT 11; BUN 27; Creat 0.82; Hemoglobin 14.4; Platelets 175; Potassium 4.7; Sodium 142   Lipid Panel    Component Value Date/Time   CHOL 172 10/13/2020 0919   TRIG 131 10/13/2020 0919   HDL 50 10/13/2020 0919   CHOLHDL 3.4 10/13/2020 0919   VLDL 26 06/08/2020 0346   LDLCALC 99 10/13/2020 0919    Additional studies/ records that were reviewed today include:   Echocardiogram: 06/08/2020 IMPRESSIONS     1. Left ventricular ejection fraction, by estimation, is 65 to 70%. The  left ventricle has normal function. The left ventricle has no regional  wall motion abnormalities. There is mild concentric left ventricular  hypertrophy. Left ventricular diastolic  parameters are consistent with Grade I diastolic dysfunction (impaired  relaxation).   2. Right ventricular systolic function is normal. The right ventricular  size is normal. There is normal pulmonary artery systolic pressure.   3. The mitral valve is normal in structure. Trivial mitral valve  regurgitation. No evidence of mitral stenosis.   4. The aortic valve is normal in structure. Aortic valve regurgitation is  not  visualized. No aortic stenosis is present.   5. The inferior vena cava is normal in size with greater than 50%  respiratory variability, suggesting right atrial pressure of 3 mmHg.   Assessment:    1. PAF (paroxysmal atrial fibrillation) ([place])   2. Encounter for monitoring flecainide therapy   3. Mixed hyperlipidemia   4. Bilateral lower extremity edema   5. Gastroesophageal reflux disease, unspecified whether esophagitis present      Plan:   In order of problems listed above:  1. Paroxysmal Atrial Fibrillation - She denies any recent palpitations and is in normal sinus rhythm by examination and EKG today. She uses a walker at baseline and does not think she can walk on a treadmill for a GXT.  Therefore, repeat EKG was obtained today and does not show any evidence of QRS widening.  She was previously on Flecainide for over 15 years without any reported side effects. Will continue low-dose Flecainide at 75 mg twice daily. If she does develop EKG abnormalities or requires a higher dose in the future, may need to consider a Lexiscan Myoview at that time given her inability to walk on a treadmill. Remains on Lopressor 50 mg twice daily as well. - No reports of active bleeding. She remains on Eliquis 5 mg twice daily for anticoagulation and recent labs in 09/2020 showed her hemoglobin was stable at 14.4 with platelets at 175.  2. HLD - Followed by her PCP. LDL was at 99 in 09/2020. She reports now only taking Atorvastatin 10 mg approximately once per week.   3. Lower Extremity Edema - Echocardiogram in 05/2020 showed a preserved EF of 65 to 70% with grade 1 diastolic dysfunction. She does not appear volume overloaded by examination today. Remains on Lasix 20 mg daily along with potassium supplementation. Her creatinine was stable at 0.82 in 09/2020.  4.  GERD - Followed by her PCP. She was recently prescribed a course of Omeprazole 40 mg daily but has also made dietary changes along with  elevating the head of her bed.    Medication Adjustments/Labs and Tests Ordered: Current medicines are reviewed  at length with the patient today.  Concerns regarding medicines are outlined above.  Medication changes, Labs and Tests ordered today are listed in the Patient Instructions below. Patient Instructions  Medication Instructions:  Your physician recommends that you continue on your current medications as directed. Please refer to the Current Medication list given to you today.  *If you need a refill on your cardiac medications before your next appointment, please call your pharmacy*   Lab Work: NONE   If you have labs (blood work) drawn today and your tests are completely normal, you will receive your results only by: [place] (if you have MyChart) OR A paper copy in the mail If you have any lab test that is abnormal or we need to change your treatment, we will call you to review the results.   Testing/Procedures: NONE    Follow-Up: At [facility], you and your health needs are our priority.  As part of our continuing mission to provide you with exceptional heart care, we have created designated Provider Care Teams.  These Care Teams include your primary Cardiologist (physician) and Advanced Practice Providers (APPs -  Physician Assistants and Nurse Practitioners) who all work together to provide you with the care you need, when you need it.  We recommend signing up for the patient portal called "MyChart".  Sign up information is provided on this After Visit Summary.  MyChart is used to connect with patients for Virtual Visits (Telemedicine).  Patients are able to view lab/test results, encounter notes, upcoming appointments, etc.  Non-urgent messages can be sent to your provider as well.   To learn more about what you can do with MyChart, go to [email or web address].    Your next appointment:   6 month(s)  The format for your next appointment:   In  Person  Provider:   Dorris Carnes, MD or Bernerd Pho, PA-C   Other Instructions Thank you for choosing [facility]!     Signed, Erma Heritage, PA-C  12/13/2020 4:36 PM    [facility] [address] [place], [place] [address] Phone: [phone] Fax: [phone]

## 2020-12-19 ENCOUNTER — Telehealth: Payer: Self-pay | Admitting: Student

## 2020-12-19 DIAGNOSIS — Z79899 Other long term (current) drug therapy: Secondary | ICD-10-CM

## 2020-12-19 DIAGNOSIS — Z5181 Encounter for therapeutic drug level monitoring: Secondary | ICD-10-CM

## 2020-12-19 NOTE — Telephone Encounter (Signed)
Pt notified of plan of care and voiced understanding .

## 2020-12-19 NOTE — Telephone Encounter (Signed)
Called patient. No answer. Left message to call back.  

## 2020-12-19 NOTE — Telephone Encounter (Signed)
    Please let the patient know I reviewed her case with Dr. Harrington Challenger and given that she is unable to walk on a treadmill following resumption of Flecainide earlier this year, Dr. Harrington Challenger recommended she have a Flecainide trough level to make sure it is in a safe range. This is a blood test (Flecainide Level lab order) and it is recommended she have this checked early in the morning (around 8-9:00 AM) before taking her Flecainide that morning.   Signed, Erma Heritage, PA-C 12/19/2020, 10:30 AM

## 2021-01-04 ENCOUNTER — Other Ambulatory Visit: Payer: Self-pay

## 2021-01-04 ENCOUNTER — Ambulatory Visit (INDEPENDENT_AMBULATORY_CARE_PROVIDER_SITE_OTHER): Payer: Medicare PPO | Admitting: *Deleted

## 2021-01-04 DIAGNOSIS — Z23 Encounter for immunization: Secondary | ICD-10-CM | POA: Diagnosis not present

## 2021-01-09 ENCOUNTER — Telehealth: Payer: Self-pay | Admitting: *Deleted

## 2021-01-09 NOTE — Telephone Encounter (Signed)
Received call from patient.   Reports that she received HD flu shot on 01/04/2021 and is now feeling ill.   Sx began 01/08/2021 and include fever (T MAX 101) chills, body aches, and fatigue. States that she is feeling better today, but Sx are not completely resolved.  Advised that Flu shot is not a live vaccine and cannot give someone the flu, but as it does decrease immune response to activate in system, if she had been exposed to another virus, it could cause Sx.   Patient states that she has been vaccinated x3 for COVID. Reports that she does not have home test at this time.   Advised to continue symptom management with OTC medications: Tylenol/ Ibuprofen for fever/ body aches, Mucinex/ Delsym for cough/ chest congestion, Afrin/Sudafed/nasal saline for sinus pressure/ nasal congestion.  If chest pain, shortness of breath, fever >104 that is unresponsive to antipyretics noted, or if unable to tolerate fluids, advised to go to ER for evaluation.   Offered telehealth appointment with NP, but patient declined as she is improving.

## 2021-01-10 NOTE — Telephone Encounter (Signed)
Agree with plan and advice.  I'd be happy to do a virtual visit if she changes her mind.

## 2021-01-18 ENCOUNTER — Other Ambulatory Visit: Payer: Self-pay | Admitting: Orthopedic Surgery

## 2021-01-18 ENCOUNTER — Other Ambulatory Visit: Payer: Self-pay | Admitting: Nurse Practitioner

## 2021-01-18 DIAGNOSIS — K219 Gastro-esophageal reflux disease without esophagitis: Secondary | ICD-10-CM

## 2021-01-25 ENCOUNTER — Ambulatory Visit: Payer: Medicare PPO | Admitting: Podiatry

## 2021-01-25 ENCOUNTER — Encounter: Payer: Self-pay | Admitting: Podiatry

## 2021-01-25 ENCOUNTER — Other Ambulatory Visit: Payer: Self-pay

## 2021-01-25 DIAGNOSIS — L608 Other nail disorders: Secondary | ICD-10-CM

## 2021-01-25 DIAGNOSIS — M79675 Pain in left toe(s): Secondary | ICD-10-CM | POA: Diagnosis not present

## 2021-01-25 DIAGNOSIS — B351 Tinea unguium: Secondary | ICD-10-CM

## 2021-01-25 DIAGNOSIS — M79674 Pain in right toe(s): Secondary | ICD-10-CM

## 2021-01-25 NOTE — Progress Notes (Signed)
This patient returns to the office for evaluation and treatment of long thick painful nails .  This patient is unable to trim her own nails since the patient cannot reach her feet.  Patient says the nails are painful walking and wearing her shoes.  Sh e returns for preventive foot care services.  General Appearance  Alert, conversant and in no acute stress.  Vascular  Dorsalis pedis and posterior tibial  pulses are not  palpable due to swelling legs/feet.  bilaterally.  Capillary return is within normal limits  Bilaterally.Cold feet  Bilaterally.  Absent digital hair  B/L.  Neurologic  Senn-Weinstein monofilament wire test within normal limits  bilaterally. Muscle power within normal limits bilaterally.  Nails Thick disfigured discolored nails with subungual debris  from hallux to fifth toes bilaterally. No evidence of bacterial infection or drainage bilaterally. Pincer hallux nails  B/L.  Orthopedic  No limitations of motion  feet .  No crepitus or effusions noted. Hammer toes  B/l  Skin  normotropic skin with no porokeratosis noted bilaterally.  No signs of infections or ulcers noted.     Onychomycosis  Pain in toes right foot  Pain in toes left foot  Debridement  of nails  1-5  B/L with a nail nipper.  Nails were then filed using a dremel tool with no incidents.    RTC 10 weeks    Amanda Mckay DPM  

## 2021-02-23 ENCOUNTER — Other Ambulatory Visit: Payer: Self-pay

## 2021-02-23 ENCOUNTER — Ambulatory Visit: Payer: Medicare PPO | Admitting: Nurse Practitioner

## 2021-02-23 ENCOUNTER — Encounter: Payer: Self-pay | Admitting: Nurse Practitioner

## 2021-02-23 VITALS — BP 122/78 | HR 76 | Temp 98.3°F | Resp 16 | Ht 66.0 in | Wt 247.4 lb

## 2021-02-23 DIAGNOSIS — E782 Mixed hyperlipidemia: Secondary | ICD-10-CM

## 2021-02-23 DIAGNOSIS — K219 Gastro-esophageal reflux disease without esophagitis: Secondary | ICD-10-CM | POA: Diagnosis not present

## 2021-02-23 DIAGNOSIS — I10 Essential (primary) hypertension: Secondary | ICD-10-CM | POA: Diagnosis not present

## 2021-02-23 NOTE — Assessment & Plan Note (Signed)
Chronic.  BP is well controlled today in clinic.  Will continue current medications and continue to collaborate with Cardiology.  Check electrolytes with kidney function today. Follow up 4 months.

## 2021-02-23 NOTE — Progress Notes (Signed)
Subjective:    Patient ID: Amanda Mckay, female    DOB: Aug 01, 1945, 75 y.o.   MRN: [id number]  HPI: Amanda Mckay is a 75 y.o. female presenting for follow up.  Chief Complaint  Patient presents with   Essential hypertension   HYPERTENSION / HYPERLIPIDEMIA Patient is currently taking Lopressor 50 mg twice daily, Lasix 20 mg daily, flecainide 75 mg twice daily, atorvastatin 10 mg once per week, and Eliquis 5 mg twice daily.  She has a history of congestive heart failure, hypertension, hyperlipidemia, and atrial fibrillation.  She is tolerating these medications well.  She is interested in completely stopping atorvastatin.  She is not fasting today. BP monitoring frequency: not checking Aspirin: no Recent stressors: no Recurrent headaches: no Visual changes: no Palpitations: no Dyspnea: no Chest pain: no Lower extremity edema: no Dizzy/lightheaded: no Myalgias: no LDL goal: Less than 100 BP goal: Less than 140/90 The 10-year ASCVD risk score (Arnett DK, et al., 2019) is: 19%   Values used to calculate the score:     Age: 8 years     Sex: Female     Is Non-Hispanic African American: No     Diabetic: No     Tobacco smoker: No     Systolic Blood Pressure: 938 mmHg     Is BP treated: Yes     HDL Cholesterol: 50 mg/dL     Total Cholesterol: 172 mg/dL   GERD At last visit, we started omeprazole 40 mg daily.  She reports her symptoms are much improved, however not all the way better.  About 1 day/week, she does wake up in the middle the night with burping and has to sit on the side of the bed.  She is sleeping with multiple pillows behind her head, however does not have a bed wedge.  She does try to avoid trigger foods. GERD control status: stable Satisfied with current treatment? yes Heartburn frequency: once per week Medication side effects: no  Medication compliance: excellent Previous GERD medications: omeprazole Dysphagia: no Odynophagia:   no Hematemesis: no Blood in stool: no EGD: no  Allergies  Allergen Reactions   Tetanus Toxoids Other (See Comments)    Reaction occurred more than 40 yrs ago--does not remember what occurred.    Outpatient Encounter Medications as of 02/23/2021  Medication Sig   acetaminophen (TYLENOL) 650 MG CR tablet Take 650-1,300 mg by mouth every 8 (eight) hours as needed for pain.   apixaban (ELIQUIS) 5 MG TABS tablet Take 1 tablet (5 mg total) by mouth 2 (two) times daily.   atorvastatin (LIPITOR) 10 MG tablet TAKE ONE TABLET (10MG  TOTAL) BY MOUTH THREE TIMES A WEEK AT BEDTIME   Calcium Carb-Cholecalciferol (CALCIUM 600+D3 PO) Take 1 tablet by mouth 2 (two) times daily.   Cholecalciferol 25 MCG (1000 UT) capsule Take 1,000 Units by mouth daily.   clotrimazole (LOTRIMIN AF) 1 % cream Apply 1 application topically 2 (two) times daily. (Patient taking differently: Apply 1 application topically 2 (two) times daily. Applied to toes twice daily for fungus)   diclofenac (CATAFLAM) 50 MG tablet TAKE ONE TABLET (50MG  TOTAL) BY MOUTH TWO TIMES DAILY   flecainide (TAMBOCOR) 150 MG tablet Take 0.5 tablets (75 mg total) by mouth 2 (two) times daily.   furosemide (LASIX) 20 MG tablet TAKE ONE (1) TABLET BY MOUTH EVERY DAY AS NEEDED   magnesium oxide (MAG-OX) 400 MG tablet Take 400 mg by mouth daily.   metoprolol tartrate (LOPRESSOR) 50 MG  tablet Take 1 tablet (50 mg total) by mouth 2 (two) times daily.   omeprazole (PRILOSEC) 40 MG capsule TAKE ONE CAPSULE (40 MG TOTAL) BY MOUTH DAILY.   potassium chloride (KLOR-CON) 10 MEQ tablet TAKE ONE (1) TABLET BY MOUTH EVERY DAY WITH LASIX FOR LEG CRAMPS   trolamine salicylate (ASPERCREME) 10 % cream Apply 1 application topically 2 (two) times daily as needed for muscle pain.    No facility-administered encounter medications on file as of 02/23/2021.    Patient Active Problem List   Diagnosis Date Noted   Gastroesophageal reflux disease 09/23/2020   Facial droop  06/07/2020   AF (paroxysmal atrial fibrillation) ([place]) 06/07/2020   RLS (restless legs syndrome) 01/04/2020   Osteopenia 12/31/2019   Compression fracture of L1 lumbar vertebra ([place]) 12/06/2019   Spondylolisthesis at L4-L5 level 12/06/2019   Class 3 obesity ([place]) 09/01/2019   Pincer nail deformity 06/10/2019   S/P right knee arthroscopy 01/28/2019 03/08/2019   Derangement of posterior horn of medial meniscus of right knee    Primary osteoarthritis of right knee    Pain due to onychomycosis of toenails of both feet 12/07/2018   Peripheral edema 12/01/2018   Essential hypertension    Chronic diastolic HF (heart failure) (HCC)    Ventral hernia without obstruction or gangrene 08/01/2017   Hyperlipidemia 02/27/2017   Vitamin D deficiency 02/27/2017   Hereditary hemochromatosis ([place]) 02/27/2017   Atrial fibrillation, chronic 02/27/2017    Past Medical History:  Diagnosis Date   Arthritis    Atrial fibrillation (HCC)    Cataract    Congestive heart failure (HCC)    Dysrhythmia    AFib   GERD (gastroesophageal reflux disease)    Hemochromatosis    Iron excess     Relevant past medical, surgical, family and social history reviewed and updated as indicated. Interim medical history since our last visit reviewed.  Review of Systems Per HPI unless specifically indicated above     Objective:    BP 122/78 (BP Location: Left Arm, Patient Position: Sitting, Cuff Size: Large)   Pulse 76   Temp 98.3 F (36.8 C) (Temporal)   Resp 16   Ht 5\' 6"  (1.676 m)   Wt 247 lb 6.4 oz (112.2 kg)   SpO2 99%   BMI 39.93 kg/m   Wt Readings from Last 3 Encounters:  02/23/21 247 lb 6.4 oz (112.2 kg)  12/13/20 243 lb 9.6 oz (110.5 kg)  11/23/20 245 lb (111.1 kg)    Physical Exam Vitals and nursing note reviewed.  Constitutional:      General: She is not in acute distress.    Appearance: Normal appearance. She is obese. She is not toxic-appearing.  HENT:     Head: Normocephalic and  atraumatic.  Eyes:     General: No scleral icterus.    Extraocular Movements: Extraocular movements intact.  Neck:     Vascular: No carotid bruit.  Cardiovascular:     Rate and Rhythm: Normal rate and regular rhythm.     Heart sounds: Normal heart sounds. No murmur heard. Pulmonary:     Effort: Pulmonary effort is normal. No respiratory distress.     Breath sounds: Normal breath sounds. No wheezing, rhonchi or rales.  Abdominal:     General: Abdomen is flat. Bowel sounds are normal. There is no distension.     Palpations: Abdomen is soft.     Tenderness: There is no abdominal tenderness.  Musculoskeletal:     Cervical back: Normal range  of motion.     Right lower leg: 1+ Pitting Edema present.     Left lower leg: 1+ Pitting Edema present.  Skin:    General: Skin is warm and dry.     Capillary Refill: Capillary refill takes less than 2 seconds.     Coloration: Skin is not jaundiced.     Findings: No bruising or erythema.  Neurological:     General: No focal deficit present.     Mental Status: She is alert and oriented to person, place, and time.     Motor: No weakness.     Gait: Gait normal.  Psychiatric:        Mood and Affect: Mood normal.        Behavior: Behavior normal.        Thought Content: Thought content normal.        Judgment: Judgment normal.      Assessment & Plan:   Problem List Items Addressed This Visit       Cardiovascular and Mediastinum   Essential hypertension - Primary    Chronic.  BP is well controlled today in clinic.  Will continue current medications and continue to collaborate with Cardiology.  Check electrolytes with kidney function today. Follow up 4 months.       Relevant Orders   BASIC METABOLIC PANEL WITH GFR     Digestive   Gastroesophageal reflux disease    Chronic.  Plan to continue omeprazole 40 mg daily and continue work on dietary changes.  We discussed risk vs. Benefit of continuing the medication.  Last vitamin D level was  normal and she is taking replacement.  Follow up 4 months.         Other   Hyperlipidemia    Chronic.  Patient is not fasting today, so we will hold off on checking lipids for now.  She is interested in completely stopping atorvastatin in the future.  Will plan to continue at atorvastatin 10 mg once weekly for now.  Patient is agreeable to this plan.  Follow up in 4 months.         Follow up plan: Return in about 4 months (around 06/24/2021) for follow up with fasting labs.

## 2021-02-23 NOTE — Assessment & Plan Note (Signed)
Chronic.  Plan to continue omeprazole 40 mg daily and continue work on dietary changes.  We discussed risk vs. Benefit of continuing the medication.  Last vitamin D level was normal and she is taking replacement.  Follow up 4 months.

## 2021-02-23 NOTE — Assessment & Plan Note (Signed)
Chronic.  Patient is not fasting today, so we will hold off on checking lipids for now.  She is interested in completely stopping atorvastatin in the future.  Will plan to continue at atorvastatin 10 mg once weekly for now.  Patient is agreeable to this plan.  Follow up in 4 months.

## 2021-02-24 LAB — BASIC METABOLIC PANEL WITH GFR
BUN/Creatinine Ratio: 36 (calc) — ABNORMAL HIGH (ref 6–22)
BUN: 29 mg/dL — ABNORMAL HIGH (ref 7–25)
CO2: 27 mmol/L (ref 20–32)
Calcium: 9.5 mg/dL (ref 8.6–10.4)
Chloride: 107 mmol/L (ref 98–110)
Creat: 0.8 mg/dL (ref 0.60–1.00)
Glucose, Bld: 86 mg/dL (ref 65–99)
Potassium: 4.3 mmol/L (ref 3.5–5.3)
Sodium: 142 mmol/L (ref 135–146)
eGFR: 77 mL/min/{1.73_m2} (ref >=60)

## 2021-02-26 ENCOUNTER — Other Ambulatory Visit: Payer: Self-pay | Admitting: Nurse Practitioner

## 2021-04-02 ENCOUNTER — Other Ambulatory Visit: Payer: Self-pay

## 2021-04-02 ENCOUNTER — Encounter: Payer: Self-pay | Admitting: Podiatry

## 2021-04-02 ENCOUNTER — Ambulatory Visit: Payer: Medicare PPO | Admitting: Podiatry

## 2021-04-02 DIAGNOSIS — M79674 Pain in right toe(s): Secondary | ICD-10-CM

## 2021-04-02 DIAGNOSIS — B351 Tinea unguium: Secondary | ICD-10-CM | POA: Diagnosis not present

## 2021-04-02 DIAGNOSIS — M79675 Pain in left toe(s): Secondary | ICD-10-CM

## 2021-04-02 DIAGNOSIS — L608 Other nail disorders: Secondary | ICD-10-CM

## 2021-04-02 NOTE — Progress Notes (Signed)
This patient returns to the office for evaluation and treatment of long thick painful nails .  This patient is unable to trim her own nails since the patient cannot reach her feet.  Patient says the nails are painful walking and wearing her shoes.  Sh e returns for preventive foot care services.  General Appearance  Alert, conversant and in no acute stress.  Vascular  Dorsalis pedis and posterior tibial  pulses are not  palpable due to swelling legs/feet.  bilaterally.  Capillary return is within normal limits  Bilaterally.Cold feet  Bilaterally.  Absent digital hair  B/L.  Neurologic  Senn-Weinstein monofilament wire test within normal limits  bilaterally. Muscle power within normal limits bilaterally.  Nails Thick disfigured discolored nails with subungual debris  from hallux to fifth toes bilaterally. No evidence of bacterial infection or drainage bilaterally. Pincer hallux nails  B/L.  Orthopedic  No limitations of motion  feet .  No crepitus or effusions noted. Hammer toes  B/l  Skin  normotropic skin with no porokeratosis noted bilaterally.  No signs of infections or ulcers noted.     Onychomycosis  Pain in toes right foot  Pain in toes left foot  Debridement  of nails  1-5  B/L with a nail nipper.  Nails were then filed using a dremel tool with no incidents.    RTC 10 weeks    Jaynie Hitch DPM  

## 2021-04-19 ENCOUNTER — Other Ambulatory Visit: Payer: Self-pay | Admitting: Nurse Practitioner

## 2021-04-19 ENCOUNTER — Other Ambulatory Visit: Payer: Self-pay | Admitting: Orthopedic Surgery

## 2021-05-15 ENCOUNTER — Other Ambulatory Visit: Payer: Self-pay | Admitting: Nurse Practitioner

## 2021-05-15 DIAGNOSIS — K219 Gastro-esophageal reflux disease without esophagitis: Secondary | ICD-10-CM

## 2021-06-04 DIAGNOSIS — M479 Spondylosis, unspecified: Secondary | ICD-10-CM | POA: Diagnosis not present

## 2021-06-04 DIAGNOSIS — G2581 Restless legs syndrome: Secondary | ICD-10-CM | POA: Diagnosis not present

## 2021-06-04 DIAGNOSIS — I739 Peripheral vascular disease, unspecified: Secondary | ICD-10-CM | POA: Diagnosis not present

## 2021-06-04 DIAGNOSIS — R531 Weakness: Secondary | ICD-10-CM | POA: Diagnosis not present

## 2021-06-04 DIAGNOSIS — I509 Heart failure, unspecified: Secondary | ICD-10-CM | POA: Diagnosis not present

## 2021-06-04 DIAGNOSIS — E785 Hyperlipidemia, unspecified: Secondary | ICD-10-CM | POA: Diagnosis not present

## 2021-06-04 DIAGNOSIS — I10 Essential (primary) hypertension: Secondary | ICD-10-CM | POA: Diagnosis not present

## 2021-06-04 DIAGNOSIS — R252 Cramp and spasm: Secondary | ICD-10-CM | POA: Diagnosis not present

## 2021-06-04 DIAGNOSIS — I480 Paroxysmal atrial fibrillation: Secondary | ICD-10-CM | POA: Diagnosis not present

## 2021-06-25 ENCOUNTER — Ambulatory Visit: Payer: Medicare PPO | Admitting: Nurse Practitioner

## 2021-07-03 DIAGNOSIS — M8580 Other specified disorders of bone density and structure, unspecified site: Secondary | ICD-10-CM | POA: Diagnosis not present

## 2021-07-03 DIAGNOSIS — K219 Gastro-esophageal reflux disease without esophagitis: Secondary | ICD-10-CM | POA: Diagnosis not present

## 2021-07-03 DIAGNOSIS — E785 Hyperlipidemia, unspecified: Secondary | ICD-10-CM | POA: Diagnosis not present

## 2021-07-03 DIAGNOSIS — I4891 Unspecified atrial fibrillation: Secondary | ICD-10-CM | POA: Diagnosis not present

## 2021-07-03 DIAGNOSIS — Z0001 Encounter for general adult medical examination with abnormal findings: Secondary | ICD-10-CM | POA: Diagnosis not present

## 2021-07-03 DIAGNOSIS — I10 Essential (primary) hypertension: Secondary | ICD-10-CM | POA: Diagnosis not present

## 2021-07-12 ENCOUNTER — Encounter: Payer: Self-pay | Admitting: Podiatry

## 2021-07-12 ENCOUNTER — Ambulatory Visit: Payer: Medicare PPO | Admitting: Podiatry

## 2021-07-12 DIAGNOSIS — L608 Other nail disorders: Secondary | ICD-10-CM

## 2021-07-12 DIAGNOSIS — B351 Tinea unguium: Secondary | ICD-10-CM | POA: Diagnosis not present

## 2021-07-12 DIAGNOSIS — M79675 Pain in left toe(s): Secondary | ICD-10-CM | POA: Diagnosis not present

## 2021-07-12 DIAGNOSIS — M79674 Pain in right toe(s): Secondary | ICD-10-CM | POA: Diagnosis not present

## 2021-07-12 NOTE — Progress Notes (Signed)
This patient returns to the office for evaluation and treatment of long thick painful nails .  This patient is unable to trim her own nails since the patient cannot reach her feet.  Patient says the nails are painful walking and wearing her shoes.  Sh e returns for preventive foot care services.  General Appearance  Alert, conversant and in no acute stress.  Vascular  Dorsalis pedis and posterior tibial  pulses are not  palpable due to swelling legs/feet.  bilaterally.  Capillary return is within normal limits  Bilaterally.Cold feet  Bilaterally.  Absent digital hair  B/L.  Neurologic  Senn-Weinstein monofilament wire test within normal limits  bilaterally. Muscle power within normal limits bilaterally.  Nails Thick disfigured discolored nails with subungual debris  from hallux to fifth toes bilaterally. No evidence of bacterial infection or drainage bilaterally. Pincer hallux nails  B/L.  Orthopedic  No limitations of motion  feet .  No crepitus or effusions noted. Hammer toes  B/l  Skin  normotropic skin with no porokeratosis noted bilaterally.  No signs of infections or ulcers noted.     Onychomycosis  Pain in toes right foot  Pain in toes left foot  Debridement  of nails  1-5  B/L with a nail nipper.  Nails were then filed using a dremel tool with no incidents.    RTC 10 weeks    Kirsty Monjaraz DPM  

## 2021-07-16 ENCOUNTER — Other Ambulatory Visit: Payer: Self-pay | Admitting: Nurse Practitioner

## 2021-07-16 ENCOUNTER — Other Ambulatory Visit: Payer: Self-pay | Admitting: Family Medicine

## 2021-07-16 DIAGNOSIS — K219 Gastro-esophageal reflux disease without esophagitis: Secondary | ICD-10-CM

## 2021-07-17 NOTE — Telephone Encounter (Signed)
Greater than 4 month seen by Derry Lory, NP. Patient needs to find new PCP.  ?Requested Prescriptions  ?Refused Prescriptions Disp Refills  ?? omeprazole (PRILOSEC) 40 MG capsule [Pharmacy Med Name: OMEPRAZOLE DR 40 MG CAP] 30 capsule 1  ?  Sig: TAKE ONE CAPSULE (40 MG TOTAL) BY MOUTH DAILY.  ?  ? Gastroenterology: Proton Pump Inhibitors Passed - 07/16/2021  3:17 PM  ?  ?  Passed - Valid encounter within last 12 months  ?  Recent Outpatient Visits   ?      ? 4 months ago Essential hypertension  ? [place] Eulogio Bear, NP  ? 7 months ago Essential hypertension  ? [place], NP  ? 8 months ago Acute cystitis with hematuria  ? [place], Jessica A, NP  ? 9 months ago Chronic diastolic HF (heart failure) ([place])  ? [place], NP  ? 1 year ago Atrial fibrillation, chronic  ? [place], Modena Nunnery, MD  ?  ?  ?Future Appointments   ?        ? In 1 month [place], [place], PA-C [facility]  ?  ? ?  ?  ?  ? ?

## 2021-07-23 DIAGNOSIS — J018 Other acute sinusitis: Secondary | ICD-10-CM | POA: Diagnosis not present

## 2021-07-23 DIAGNOSIS — R059 Cough, unspecified: Secondary | ICD-10-CM | POA: Diagnosis not present

## 2021-07-24 DIAGNOSIS — M8580 Other specified disorders of bone density and structure, unspecified site: Secondary | ICD-10-CM | POA: Diagnosis not present

## 2021-07-24 DIAGNOSIS — Z130 Encounter for screening for diseases of the blood and blood-forming organs and certain disorders involving the immune mechanism: Secondary | ICD-10-CM | POA: Diagnosis not present

## 2021-07-24 DIAGNOSIS — Z131 Encounter for screening for diabetes mellitus: Secondary | ICD-10-CM | POA: Diagnosis not present

## 2021-07-24 DIAGNOSIS — Z1329 Encounter for screening for other suspected endocrine disorder: Secondary | ICD-10-CM | POA: Diagnosis not present

## 2021-07-24 DIAGNOSIS — E785 Hyperlipidemia, unspecified: Secondary | ICD-10-CM | POA: Diagnosis not present

## 2021-07-24 DIAGNOSIS — Z13228 Encounter for screening for other metabolic disorders: Secondary | ICD-10-CM | POA: Diagnosis not present

## 2021-07-24 DIAGNOSIS — I10 Essential (primary) hypertension: Secondary | ICD-10-CM | POA: Diagnosis not present

## 2021-07-26 ENCOUNTER — Other Ambulatory Visit (HOSPITAL_COMMUNITY): Payer: Self-pay | Admitting: Nurse Practitioner

## 2021-07-26 ENCOUNTER — Other Ambulatory Visit: Payer: Self-pay | Admitting: Student

## 2021-07-26 DIAGNOSIS — Z1231 Encounter for screening mammogram for malignant neoplasm of breast: Secondary | ICD-10-CM

## 2021-08-01 DIAGNOSIS — E785 Hyperlipidemia, unspecified: Secondary | ICD-10-CM | POA: Diagnosis not present

## 2021-08-01 DIAGNOSIS — I10 Essential (primary) hypertension: Secondary | ICD-10-CM | POA: Diagnosis not present

## 2021-08-01 DIAGNOSIS — H6123 Impacted cerumen, bilateral: Secondary | ICD-10-CM | POA: Diagnosis not present

## 2021-08-01 DIAGNOSIS — K219 Gastro-esophageal reflux disease without esophagitis: Secondary | ICD-10-CM | POA: Diagnosis not present

## 2021-08-08 ENCOUNTER — Other Ambulatory Visit: Payer: Self-pay | Admitting: Orthopedic Surgery

## 2021-09-12 ENCOUNTER — Ambulatory Visit: Payer: Medicare PPO | Admitting: Student

## 2021-09-12 ENCOUNTER — Encounter: Payer: Self-pay | Admitting: Student

## 2021-09-12 VITALS — BP 128/68 | HR 88 | Ht 66.0 in | Wt 252.0 lb

## 2021-09-12 DIAGNOSIS — R600 Localized edema: Secondary | ICD-10-CM | POA: Diagnosis not present

## 2021-09-12 DIAGNOSIS — I480 Paroxysmal atrial fibrillation: Secondary | ICD-10-CM | POA: Diagnosis not present

## 2021-09-12 DIAGNOSIS — E782 Mixed hyperlipidemia: Secondary | ICD-10-CM

## 2021-09-12 DIAGNOSIS — K219 Gastro-esophageal reflux disease without esophagitis: Secondary | ICD-10-CM

## 2021-09-12 MED ORDER — METOPROLOL TARTRATE 50 MG PO TABS: 50.0000 mg | ORAL_TABLET | Freq: Two times a day (BID) | ORAL | 3 refills | Status: DC

## 2021-09-12 MED ORDER — OMEPRAZOLE 20 MG PO CPDR: 20.0000 mg | DELAYED_RELEASE_CAPSULE | Freq: Every day | ORAL | 1 refills | Status: DC

## 2021-09-12 NOTE — Patient Instructions (Addendum)
Limit daily fluid intake to less than 2 Liters per day. Please weight yourself every morning. Take an extra Lasix tablet if weight increases by 2 pounds overnight or 5 pounds in a single week.    Medication Instructions:  Your physician recommends that you continue on your current medications as directed. Please refer to the Current Medication list given to you today.   Labwork: None today  Testing/Procedures: None today  Follow-Up: 1 year  Any Other Special Instructions Will Be Listed Below (If Applicable).  If you need a refill on your cardiac medications before your next appointment, please call your pharmacy.

## 2021-09-12 NOTE — Progress Notes (Signed)
Cardiology Office Note    Date:  09/12/2021   ID:  Amanda Mckay [birth year], MRN [id number]  PCP:  Beverly Milch, NP  Cardiologist: Dorris Carnes, MD    Chief Complaint  Patient presents with   Follow-up    Annual Visit    History of Present Illness:    Amanda Mckay is a 76 y.o. female with past medical history of paroxysmal atrial fibrillation, HLD, GERD and hemochromatosis who presents to the office today for annual follow-up.  She was last examined by myself in 11/2020 and reported overall doing well at that time and denied any recent chest pain or palpitations. She was unable to walk on a treadmill since initiation of Flecainide and Dr. Harrington Challenger did recommend obtaining a Flecainide level. She was continued on Eliquis 5 mg twice daily for anticoagulation.  In talking with the patient today, she reports overall doing well from a cardiac perspective since her last office visit. She denies any recurrent palpitations. She denies any recent exertional chest pain or progressive dyspnea on exertion. Activity is limited due to back pain and she uses a rolling walker. No specific orthopnea or PND. She does experience intermittent lower extremity edema and takes Lasix 20 mg daily. She remains on Eliquis 5 mg twice daily for anticoagulation and denies any melena or hematochezia. Reports that she thinks she experienced hematuria intermittently over the past few months in the setting of having passed kidney stones but this has now resolved. She did have recent labs with her PCP within the past few months.   Past Medical History:  Diagnosis Date   Arthritis    Atrial fibrillation ([place])    Cataract    Congestive heart failure (HCC)    Dysrhythmia    AFib   GERD (gastroesophageal reflux disease)    Hemochromatosis    Iron excess     Past Surgical History:  Procedure Laterality Date   CESAREAN SECTION     KNEE ARTHROSCOPY WITH MEDIAL MENISECTOMY Right 01/28/2019    Procedure: RIGHT KNEE ARTHROSCOPY WITH MEDIAL MENISCECTOMY;  Surgeon: Carole Civil, MD;  Location: [facility];  Service: Orthopedics;  Laterality: Right;   TONSILLECTOMY     VENTRAL HERNIA REPAIR N/A 01/26/2018   Procedure: HERNIA REPAIR VENTRAL ADULT WITH MESH;  Surgeon: Virl Cagey, MD;  Location: [facility];  Service: General;  Laterality: N/A;    Current Medications: Outpatient Medications Prior to Visit  Medication Sig Dispense Refill   acetaminophen (TYLENOL) 650 MG CR tablet Take 650-1,300 mg by mouth every 8 (eight) hours as needed for pain.     atorvastatin (LIPITOR) 10 MG tablet TAKE ONE TABLET ('10MG'$  TOTAL) BY MOUTH THREE TIMES A WEEK AT BEDTIME 30 tablet 0   Calcium Carb-Cholecalciferol (CALCIUM 600+D3 PO) Take 1 tablet by mouth 2 (two) times daily.     Cholecalciferol 25 MCG (1000 UT) capsule Take 1,000 Units by mouth daily.     clotrimazole (LOTRIMIN AF) 1 % cream Apply 1 application topically 2 (two) times daily. (Patient taking differently: Apply 1 application  topically 2 (two) times daily. Applied to toes twice daily for fungus) 30 g 1   diclofenac (CATAFLAM) 50 MG tablet TAKE ONE TABLET ('50MG'$  TOTAL) BY MOUTH TWO TIMES DAILY 60 tablet 2   ELIQUIS 5 MG TABS tablet TAKE ONE TABLET BY MOUTH TWICE A DAY 180 tablet 3   flecainide (TAMBOCOR) 150 MG tablet TAKE 0.5 TABLETS (75 MG TOTAL) BY MOUTH 2 (TWO) TIMES DAILY  90 tablet 3   furosemide (LASIX) 20 MG tablet TAKE ONE (1) TABLET BY MOUTH EVERY DAY AS NEEDED 90 tablet 3   magnesium oxide (MAG-OX) 400 MG tablet Take 400 mg by mouth daily.     potassium chloride (KLOR-CON) 10 MEQ tablet TAKE ONE (1) TABLET BY MOUTH EVERY DAY WITH LASIX FOR LEG CRAMPS 90 tablet 2   trolamine salicylate (ASPERCREME) 10 % cream Apply 1 application topically 2 (two) times daily as needed for muscle pain.      metoprolol tartrate (LOPRESSOR) 50 MG tablet Take 1 tablet (50 mg total) by mouth 2 (two) times daily. 180 tablet 3   omeprazole (PRILOSEC) 40  MG capsule TAKE ONE CAPSULE (40 MG TOTAL) BY MOUTH DAILY. 30 capsule 1   No facility-administered medications prior to visit.     Allergies:   Tetanus toxoids   Social History   Socioeconomic History   Marital status: Married    Spouse name: Not on file   Number of children: Not on file   Years of education: Not on file   Highest education level: Not on file  Occupational History   Not on file  Tobacco Use   Smoking status: Never   Smokeless tobacco: Never  Vaping Use   Vaping Use: Never used  Substance and Sexual Activity   Alcohol use: Yes    Alcohol/week: 2.0 standard drinks of alcohol    Types: 2 Glasses of wine per week   Drug use: No   Sexual activity: Yes  Other Topics Concern   Not on file  Social History Narrative   Retired Copywriter, advertising.   Married.   Has children 3. Grandchildren, 2.    Grew up in [place], [place].   Married for over 31 years.    Eats all food groups.   Just moved to [place], [place] in 8/18.    Social Determinants of Health   Financial Resource Strain: Not on file  Food Insecurity: Not on file  Transportation Needs: Not on file  Physical Activity: Not on file  Stress: Not on file  Social Connections: Not on file     Family History:  The patient's family history includes Arthritis in her brother; Atrial fibrillation in her son; Diabetes in her paternal grandfather; Stroke in her maternal grandmother and mother.   Review of Systems:    Please see the history of present illness.     All other systems reviewed and are otherwise negative except as noted above.   Physical Exam:    VS:  BP 128/68   Pulse 88   Ht '5\' 6"'$  (1.676 m)   Wt 252 lb (114.3 kg)   SpO2 95%   BMI 40.67 kg/m    General: Well developed, well nourished,female appearing in no acute distress. Head: Normocephalic, atraumatic. Neck: No carotid bruits. JVD not elevated.  Lungs: Respirations regular and unlabored, without wheezes or rales.  Heart: Regular rate and  rhythm. No S3 or S4.  No murmur, no rubs, or gallops appreciated. Abdomen: Appears non-distended. No obvious abdominal masses. Msk:  Strength and tone appear normal for age. No obvious joint deformities or effusions. Extremities: No clubbing or cyanosis. Trace lower extremity edema.  Distal pedal pulses are 2+ bilaterally. Neuro: Alert and oriented X 3. Moves all extremities spontaneously. No focal deficits noted. Psych:  Responds to questions appropriately with a normal affect. Skin: No rashes or lesions noted  Wt Readings from Last 3 Encounters:  09/12/21 252 lb (114.3 kg)  02/23/21 247 lb 6.4 oz (112.2 kg)  12/13/20 243 lb 9.6 oz (110.5 kg)     Studies/Labs Reviewed:   EKG:  EKG is not ordered today.    Recent Labs: 10/13/2020: ALT 11; Hemoglobin 14.4; Platelets 175 02/23/2021: BUN 29; Creat 0.80; Potassium 4.3; Sodium 142   Lipid Panel    Component Value Date/Time   CHOL 172 10/13/2020 0919   TRIG 131 10/13/2020 0919   HDL 50 10/13/2020 0919   CHOLHDL 3.4 10/13/2020 0919   VLDL 26 06/08/2020 0346   LDLCALC 99 10/13/2020 0919    Additional studies/ records that were reviewed today include:   Echocardiogram: 05/2020 IMPRESSIONS     1. Left ventricular ejection fraction, by estimation, is 65 to 70%. The  left ventricle has normal function. The left ventricle has no regional  wall motion abnormalities. There is mild concentric left ventricular  hypertrophy. Left ventricular diastolic  parameters are consistent with Grade I diastolic dysfunction (impaired  relaxation).   2. Right ventricular systolic function is normal. The right ventricular  size is normal. There is normal pulmonary artery systolic pressure.   3. The mitral valve is normal in structure. Trivial mitral valve  regurgitation. No evidence of mitral stenosis.   4. The aortic valve is normal in structure. Aortic valve regurgitation is  not visualized. No aortic stenosis is present.   5. The inferior vena  cava is normal in size with greater than 50%  respiratory variability, suggesting right atrial pressure of 3 mmHg.   Assessment:    1. PAF (paroxysmal atrial fibrillation) ([place])   2. Bilateral lower extremity edema   3. Mixed hyperlipidemia   4. Gastroesophageal reflux disease, unspecified whether esophagitis present      Plan:   In order of problems listed above:  1. Paroxysmal Atrial Fibrillation - She denies any recent palpitations and is in normal sinus rhythm by examination today. She remains on Flecainide 75 mg twice daily and Lopressor 50 mg twice daily. She did have recent labs with her PCP and if a Flecainide level was not obtained, would recommend checking it as this was not obtained following her last office visit (would need to be checked in the morning hours before her AM dose).  - She previously had brief hematuria in the setting of likely passing a kidney stone but denies any recent symptoms. She remains on Eliquis 5 mg twice daily for anticoagulation. She did have recent labs with her PCP and will request a copy of these.   2. Lower Extremity Edema - Symptoms have overall been stable. I encouraged her to follow daily weights at home and to take an extra Lasix tablet along with an extra K-dur if weight increases by more than 2 pounds overnight or 5 pounds in 1 week. She remains on Lasix 20 mg daily at this time. Will request most recent labs from her PCP.   3. HLD - She remains on Atorvastatin 10 mg every other day as she previously had worsening myalgias with higher dosing. Requesting most recent labs.   4. GERD - She has been on Omeprazole '40mg'$  daily and is interested in possibly stopping this given her well-controlled symptoms. I recommended she reduce to '20mg'$  daily for several weeks and if no change in symptoms, could try going without the medication to see if symptoms remain well-controlled.     Medication Adjustments/Labs and Tests Ordered: Current medicines are  reviewed at length with the patient today.  Concerns regarding medicines are outlined  above.  Medication changes, Labs and Tests ordered today are listed in the Patient Instructions below. Patient Instructions  Limit daily fluid intake to less than 2 Liters per day. Please weight yourself every morning. Take an extra Lasix tablet if weight increases by 2 pounds overnight or 5 pounds in a single week.    Medication Instructions:  Your physician recommends that you continue on your current medications as directed. Please refer to the Current Medication list given to you today.   Labwork: None today  Testing/Procedures: None today  Follow-Up: 1 year  Any Other Special Instructions Will Be Listed Below (If Applicable).  If you need a refill on your cardiac medications before your next appointment, please call your pharmacy.    Signed, Erma Heritage, PA-C  09/12/2021 4:33 PM    [facility] [address] [place], [place] [address] Phone: [phone] Fax: [phone]

## 2021-10-18 ENCOUNTER — Ambulatory Visit: Payer: Medicare PPO | Admitting: Podiatry

## 2021-10-18 ENCOUNTER — Encounter: Payer: Self-pay | Admitting: Podiatry

## 2021-10-18 DIAGNOSIS — M79675 Pain in left toe(s): Secondary | ICD-10-CM

## 2021-10-18 DIAGNOSIS — B351 Tinea unguium: Secondary | ICD-10-CM | POA: Diagnosis not present

## 2021-10-18 DIAGNOSIS — L608 Other nail disorders: Secondary | ICD-10-CM

## 2021-10-18 DIAGNOSIS — M79674 Pain in right toe(s): Secondary | ICD-10-CM | POA: Diagnosis not present

## 2021-10-18 NOTE — Progress Notes (Signed)
This patient returns to the office for evaluation and treatment of long thick painful nails .  This patient is unable to trim her own nails since the patient cannot reach her feet.  Patient says the nails are painful walking and wearing her shoes.  Sh e returns for preventive foot care services.  General Appearance  Alert, conversant and in no acute stress.  Vascular  Dorsalis pedis and posterior tibial  pulses are not  palpable due to swelling legs/feet.  bilaterally.  Capillary return is within normal limits  Bilaterally.Cold feet  Bilaterally.  Absent digital hair  B/L.  Neurologic  Senn-Weinstein monofilament wire test within normal limits  bilaterally. Muscle power within normal limits bilaterally.  Nails Thick disfigured discolored nails with subungual debris  from hallux to fifth toes bilaterally. No evidence of bacterial infection or drainage bilaterally. Pincer hallux nails  B/L.  Orthopedic  No limitations of motion  feet .  No crepitus or effusions noted. Hammer toes  B/l  Skin  normotropic skin with no porokeratosis noted bilaterally.  No signs of infections or ulcers noted.     Onychomycosis  Pain in toes right foot  Pain in toes left foot  Debridement  of nails  1-5  B/L with a nail nipper.  Nails were then filed using a dremel tool with no incidents.    RTC 10 weeks    Michaella Imai DPM  

## 2021-11-14 ENCOUNTER — Other Ambulatory Visit: Payer: Self-pay | Admitting: Orthopedic Surgery

## 2022-01-21 ENCOUNTER — Encounter: Payer: Self-pay | Admitting: Podiatry

## 2022-01-21 ENCOUNTER — Ambulatory Visit: Payer: Medicare PPO | Admitting: Podiatry

## 2022-01-21 DIAGNOSIS — M79674 Pain in right toe(s): Secondary | ICD-10-CM | POA: Diagnosis not present

## 2022-01-21 DIAGNOSIS — L608 Other nail disorders: Secondary | ICD-10-CM | POA: Diagnosis not present

## 2022-01-21 DIAGNOSIS — M79675 Pain in left toe(s): Secondary | ICD-10-CM | POA: Diagnosis not present

## 2022-01-21 DIAGNOSIS — B351 Tinea unguium: Secondary | ICD-10-CM

## 2022-01-21 NOTE — Progress Notes (Signed)
This patient returns to the office for evaluation and treatment of long thick painful nails .  This patient is unable to trim her own nails since the patient cannot reach her feet.  Patient says the nails are painful walking and wearing her shoes.  Sh e returns for preventive foot care services.  General Appearance  Alert, conversant and in no acute stress.  Vascular  Dorsalis pedis and posterior tibial  pulses are not  palpable due to swelling legs/feet.  bilaterally.  Capillary return is within normal limits  Bilaterally.Cold feet  Bilaterally.  Absent digital hair  B/L.  Neurologic  Senn-Weinstein monofilament wire test within normal limits  bilaterally. Muscle power within normal limits bilaterally.  Nails Thick disfigured discolored nails with subungual debris  from hallux to fifth toes bilaterally. No evidence of bacterial infection or drainage bilaterally. Pincer hallux nails  B/L.  Orthopedic  No limitations of motion  feet .  No crepitus or effusions noted. Hammer toes  B/l  Skin  normotropic skin with no porokeratosis noted bilaterally.  No signs of infections or ulcers noted.     Onychomycosis  Pain in toes right foot  Pain in toes left foot  Debridement  of nails  1-5  B/L with a nail nipper.  Nails were then filed using a dremel tool with no incidents.    RTC 10 weeks    Gardiner Barefoot DPM

## 2022-02-04 ENCOUNTER — Encounter: Payer: Self-pay | Admitting: Family Medicine

## 2022-02-04 ENCOUNTER — Ambulatory Visit: Payer: Medicare PPO | Admitting: Family Medicine

## 2022-02-04 VITALS — BP 124/78 | HR 66 | Temp 97.5°F | Ht 66.0 in | Wt 249.0 lb

## 2022-02-04 DIAGNOSIS — I482 Chronic atrial fibrillation, unspecified: Secondary | ICD-10-CM | POA: Diagnosis not present

## 2022-02-04 DIAGNOSIS — Z1211 Encounter for screening for malignant neoplasm of colon: Secondary | ICD-10-CM

## 2022-02-04 DIAGNOSIS — Z0000 Encounter for general adult medical examination without abnormal findings: Secondary | ICD-10-CM

## 2022-02-04 DIAGNOSIS — K219 Gastro-esophageal reflux disease without esophagitis: Secondary | ICD-10-CM

## 2022-02-04 DIAGNOSIS — I10 Essential (primary) hypertension: Secondary | ICD-10-CM | POA: Diagnosis not present

## 2022-02-04 DIAGNOSIS — E782 Mixed hyperlipidemia: Secondary | ICD-10-CM

## 2022-02-04 NOTE — Assessment & Plan Note (Signed)
Well controlled, BP 124/78 in today's visit. Continue current medication regimen.

## 2022-02-04 NOTE — Assessment & Plan Note (Signed)
Well controlled on Prilosec '20mg'$  daily. She reports dose decreased by cardiology.

## 2022-02-04 NOTE — Progress Notes (Signed)
New Patient Office Visit  Subjective    Patient ID: Amanda Mckay, female    DOB: June 25, 1945  Age: 76 y.o. MRN: [id number]  CC:  Chief Complaint  Patient presents with   Establish Care    HPI Amanda Mckay presents to establish care. Oriented to practice routines and expectations. She has a past medical history significant for atrial fibrillation, congestive heart failure, HTN, HLD, GERD, and hemochromatosis. She is followed closely by Cardiology. She is taking Eliquis, Flecainide, Metoprolol, Lasix, and Atorvastatin. She denies concerns today.  Declines mammogram or PAP Declines shingles and COVID vaccines, flu completed  Outpatient Encounter Medications as of 02/04/2022  Medication Sig   acetaminophen (TYLENOL) 650 MG CR tablet Take 650-1,300 mg by mouth every 8 (eight) hours as needed for pain.   atorvastatin (LIPITOR) 10 MG tablet TAKE ONE TABLET ('10MG'$  TOTAL) BY MOUTH THREE TIMES A WEEK AT BEDTIME   Calcium Carb-Cholecalciferol (CALCIUM 600+D3 PO) Take 1 tablet by mouth 2 (two) times daily.   Cholecalciferol 25 MCG (1000 UT) capsule Take 1,000 Units by mouth daily.   clotrimazole (LOTRIMIN AF) 1 % cream Apply 1 application topically 2 (two) times daily. (Patient taking differently: Apply 1 application  topically 2 (two) times daily. Applied to toes twice daily for fungus)   diclofenac (CATAFLAM) 50 MG tablet TAKE ONE TABLET ('50MG'$  TOTAL) BY MOUTH TWO TIMES DAILY   ELIQUIS 5 MG TABS tablet TAKE ONE TABLET BY MOUTH TWICE A DAY   flecainide (TAMBOCOR) 150 MG tablet TAKE 0.5 TABLETS (75 MG TOTAL) BY MOUTH 2 (TWO) TIMES DAILY   furosemide (LASIX) 20 MG tablet TAKE ONE (1) TABLET BY MOUTH EVERY DAY AS NEEDED   magnesium oxide (MAG-OX) 400 MG tablet Take 400 mg by mouth daily.   metoprolol tartrate (LOPRESSOR) 50 MG tablet Take 1 tablet (50 mg total) by mouth 2 (two) times daily.   omeprazole (PRILOSEC) 20 MG capsule Take 1 capsule (20 mg total) by mouth daily.   potassium  chloride (KLOR-CON) 10 MEQ tablet TAKE ONE (1) TABLET BY MOUTH EVERY DAY WITH LASIX FOR LEG CRAMPS   trolamine salicylate (ASPERCREME) 10 % cream Apply 1 application topically 2 (two) times daily as needed for muscle pain.    No facility-administered encounter medications on file as of 02/04/2022.    Past Medical History:  Diagnosis Date   Arthritis    Atrial fibrillation ([place])    Cataract    Congestive heart failure (HCC)    Dysrhythmia    AFib   GERD (gastroesophageal reflux disease)    Hemochromatosis    Iron excess     Past Surgical History:  Procedure Laterality Date   CESAREAN SECTION     KNEE ARTHROSCOPY WITH MEDIAL MENISECTOMY Right 01/28/2019   Procedure: RIGHT KNEE ARTHROSCOPY WITH MEDIAL MENISCECTOMY;  Surgeon: Carole Civil, MD;  Location: [facility];  Service: Orthopedics;  Laterality: Right;   TONSILLECTOMY     VENTRAL HERNIA REPAIR N/A 01/26/2018   Procedure: HERNIA REPAIR VENTRAL ADULT WITH MESH;  Surgeon: Virl Cagey, MD;  Location: [facility];  Service: General;  Laterality: N/A;    Family History  Problem Relation Age of Onset   Stroke Mother    Atrial fibrillation Son    Stroke Maternal Grandmother    Diabetes Paternal Grandfather    Arthritis Brother     Social History   Socioeconomic History   Marital status: Married    Spouse name: Not on file   Number of  children: Not on file   Years of education: Not on file   Highest education level: Not on file  Occupational History   Not on file  Tobacco Use   Smoking status: Never   Smokeless tobacco: Never  Vaping Use   Vaping Use: Never used  Substance and Sexual Activity   Alcohol use: Not Currently    Alcohol/week: 2.0 standard drinks of alcohol    Types: 2 Glasses of wine per week   Drug use: No   Sexual activity: Yes  Other Topics Concern   Not on file  Social History Narrative   Retired Copywriter, advertising.   Married.   Has children 3. Grandchildren, 2.    Grew up in [place].   Married for over 31 years.    Eats all food groups.   Just moved to [place], [place] in 8/18.    Social Determinants of Health   Financial Resource Strain: Not on file  Food Insecurity: Not on file  Transportation Needs: Not on file  Physical Activity: Not on file  Stress: Not on file  Social Connections: Not on file  Intimate Partner Violence: Not on file    Review of Systems  Constitutional: Negative.   HENT: Negative.    Eyes: Negative.   Respiratory: Negative.    Cardiovascular:  Positive for leg swelling.  Gastrointestinal:  Positive for heartburn.  Genitourinary: Negative.   Musculoskeletal: Negative.   Skin: Negative.   Neurological: Negative.   Endo/Heme/Allergies: Negative.   Psychiatric/Behavioral: Negative.    All other systems reviewed and are negative.       Objective    BP 124/78   Pulse 66   Temp (!) 97.5 F (36.4 C) (Oral)   Ht '5\' 6"'$  (1.676 m)   Wt 249 lb (112.9 kg)   SpO2 99%   BMI 40.19 kg/m   Physical Exam Vitals and nursing note reviewed.  Constitutional:      Appearance: Normal appearance. She is normal weight.  HENT:     Head: Normocephalic and atraumatic.  Cardiovascular:     Rate and Rhythm: Normal rate and regular rhythm.     Pulses: Normal pulses.     Heart sounds: Normal heart sounds.  Pulmonary:     Effort: Pulmonary effort is normal.     Breath sounds: Normal breath sounds.  Musculoskeletal:     Right lower leg: 2+ Pitting Edema present.     Left lower leg: 2+ Pitting Edema present.     Right ankle: Swelling present.     Left ankle: Swelling present.  Skin:    General: Skin is warm and dry.     Findings: Erythema present.     Comments: BLE chronic, unchanged from baseline  Neurological:     General: No focal deficit present.     Mental Status: She is alert and oriented to person, place, and time. Mental status is at baseline.  Psychiatric:        Mood and Affect: Mood normal.        Behavior: Behavior normal.         Thought Content: Thought content normal.        Judgment: Judgment normal.        Assessment & Plan:   Routine adult health maintenance -     Cologuard -     CBC with Differential/Platelet; Future -     COMPLETE METABOLIC PANEL WITH GFR; Future -     Lipid panel; Future  Essential hypertension Assessment & Plan: Well controlled, BP 124/78 in today's visit. Continue current medication regimen.  Orders: -     CBC with Differential/Platelet; Future -     COMPLETE METABOLIC PANEL WITH GFR; Future -     Lipid panel; Future  Mixed hyperlipidemia Assessment & Plan: Patient not fasting today and would like to return after the holidays for labs. Tolerating Lipitor '10mg'$  daily and will continue this. Encouraged a heart healthy diet and activity as tolerated.  Orders: -     Lipid panel; Future  Atrial fibrillation, chronic ([place]) Assessment & Plan: Well controlled on Metoprolol, Flecainide, Eliquis. Regular rate and rhythm today and no symptoms. Tolerating medication regimen well. Following up annually with cardiology. Denies chest pain, palpitations, or shortness of breath.   Colon cancer screening -     Cologuard  Gastroesophageal reflux disease without esophagitis Assessment & Plan: Well controlled on Prilosec '20mg'$  daily. She reports dose decreased by cardiology.       Return in about 4 weeks (around 03/04/2022) for annual physical with fasting labs, also needs AWV.   Rubie Maid, FNP

## 2022-02-04 NOTE — Assessment & Plan Note (Signed)
Patient not fasting today and would like to return after the holidays for labs. Tolerating Lipitor '10mg'$  daily and will continue this. Encouraged a heart healthy diet and activity as tolerated.

## 2022-02-04 NOTE — Assessment & Plan Note (Signed)
Well controlled on Metoprolol, Flecainide, Eliquis. Regular rate and rhythm today and no symptoms. Tolerating medication regimen well. Following up annually with cardiology. Denies chest pain, palpitations, or shortness of breath.

## 2022-02-13 ENCOUNTER — Other Ambulatory Visit: Payer: Self-pay | Admitting: Orthopedic Surgery

## 2022-02-24 LAB — COLOGUARD: COLOGUARD: POSITIVE — AB

## 2022-02-25 ENCOUNTER — Encounter (INDEPENDENT_AMBULATORY_CARE_PROVIDER_SITE_OTHER): Payer: Self-pay | Admitting: *Deleted

## 2022-02-25 ENCOUNTER — Other Ambulatory Visit: Payer: Self-pay | Admitting: Family Medicine

## 2022-02-25 DIAGNOSIS — R195 Other fecal abnormalities: Secondary | ICD-10-CM

## 2022-03-25 ENCOUNTER — Ambulatory Visit (INDEPENDENT_AMBULATORY_CARE_PROVIDER_SITE_OTHER): Payer: Medicare PPO | Admitting: Family Medicine

## 2022-03-25 ENCOUNTER — Encounter: Payer: Self-pay | Admitting: Family Medicine

## 2022-03-25 VITALS — BP 140/78 | HR 71 | Temp 97.7°F | Ht 66.0 in | Wt 249.0 lb

## 2022-03-25 DIAGNOSIS — I10 Essential (primary) hypertension: Secondary | ICD-10-CM | POA: Diagnosis not present

## 2022-03-25 DIAGNOSIS — Z0001 Encounter for general adult medical examination with abnormal findings: Secondary | ICD-10-CM | POA: Diagnosis not present

## 2022-03-25 DIAGNOSIS — E782 Mixed hyperlipidemia: Secondary | ICD-10-CM

## 2022-03-25 DIAGNOSIS — Z0000 Encounter for general adult medical examination without abnormal findings: Secondary | ICD-10-CM

## 2022-03-25 NOTE — Assessment & Plan Note (Signed)
Today your medical history was reviewed and routine physical exam with labs was performed. Recommended daily exercise as tolerated and consuming a well-balanced diet. Advised to stop smoking if a smoker, avoid smoking if a non-smoker, limit alcohol consumption to 1 drink per day for women and 2 drinks per day for men, and avoid illicit drug use. Counseled on the importance of sunscreen use. Counseled in mental health awareness and when to seek medical care. Vaccine maintenance discussed. Appropriate health maintenance items reviewed. Return to office in 1 year for annual physical exam.

## 2022-03-25 NOTE — Progress Notes (Signed)
Complete physical exam  Patient: Amanda Mckay   DOB: 08-31-1945   76 y.o. Female  MRN: [id number]  Subjective:    Chief Complaint  Patient presents with   Annual Exam    Amanda Mckay is a 77 y.o. female who presents today for a complete physical exam. She reports consuming a low sodium diet. The patient does not participate in regular exercise at present. She generally feels well. She reports sleeping well. She does not have additional problems to discuss today.    Most recent fall risk assessment:    03/25/2022    9:51 AM  [place] in the past year? 0     Most recent depression screenings:    03/25/2022    9:51 AM 02/04/2022   10:48 AM  PHQ 2/9 Scores  PHQ - 2 Score 0 0  PHQ- 9 Score 0 0    Vision:Within last year and Dental: No current dental problems and Receives regular dental care  Past Medical History:  Diagnosis Date   Arthritis    Atrial fibrillation ([place])    Cataract    Congestive heart failure (HCC)    Dysrhythmia    AFib   GERD (gastroesophageal reflux disease)    Hemochromatosis    Iron excess    Past Surgical History:  Procedure Laterality Date   CESAREAN SECTION     KNEE ARTHROSCOPY WITH MEDIAL MENISECTOMY Right 01/28/2019   Procedure: RIGHT KNEE ARTHROSCOPY WITH MEDIAL MENISCECTOMY;  Surgeon: Carole Civil, MD;  Location: [facility];  Service: Orthopedics;  Laterality: Right;   TONSILLECTOMY     VENTRAL HERNIA REPAIR N/A 01/26/2018   Procedure: HERNIA REPAIR VENTRAL ADULT WITH MESH;  Surgeon: Virl Cagey, MD;  Location: [facility];  Service: General;  Laterality: N/A;   Family History  Problem Relation Age of Onset   Stroke Mother    Atrial fibrillation Son    Stroke Maternal Grandmother    Diabetes Paternal Grandfather    Arthritis Brother    Allergies  Allergen Reactions   Tetanus Toxoids Other (See Comments)    Reaction occurred more than 40 yrs ago--does not remember what occurred.      Patient Care  Team: Rubie Maid, FNP as PCP - General (Family Medicine) Fay Records, MD as PCP - Cardiology (Cardiology)   Outpatient Medications Prior to Visit  Medication Sig   acetaminophen (TYLENOL) 650 MG CR tablet Take 650-1,300 mg by mouth every 8 (eight) hours as needed for pain.   atorvastatin (LIPITOR) 10 MG tablet TAKE ONE TABLET ('10MG'$  TOTAL) BY MOUTH THREE TIMES A WEEK AT BEDTIME   Calcium Carb-Cholecalciferol (CALCIUM 600+D3 PO) Take 1 tablet by mouth 2 (two) times daily.   Cholecalciferol 25 MCG (1000 UT) capsule Take 1,000 Units by mouth daily.   clotrimazole (LOTRIMIN AF) 1 % cream Apply 1 application topically 2 (two) times daily. (Patient taking differently: Apply 1 application  topically 2 (two) times daily. Applied to toes twice daily for fungus)   diclofenac (CATAFLAM) 50 MG tablet TAKE ONE TABLET ('50MG'$  TOTAL) BY MOUTH TWO TIMES DAILY   ELIQUIS 5 MG TABS tablet TAKE ONE TABLET BY MOUTH TWICE A DAY   flecainide (TAMBOCOR) 150 MG tablet TAKE 0.5 TABLETS (75 MG TOTAL) BY MOUTH 2 (TWO) TIMES DAILY   furosemide (LASIX) 20 MG tablet TAKE ONE (1) TABLET BY MOUTH EVERY DAY AS NEEDED   magnesium oxide (MAG-OX) 400 MG tablet Take 400 mg by mouth  daily.   metoprolol tartrate (LOPRESSOR) 50 MG tablet Take 1 tablet (50 mg total) by mouth 2 (two) times daily.   omeprazole (PRILOSEC) 20 MG capsule Take 1 capsule (20 mg total) by mouth daily.   potassium chloride (KLOR-CON) 10 MEQ tablet TAKE ONE (1) TABLET BY MOUTH EVERY DAY WITH LASIX FOR LEG CRAMPS   trolamine salicylate (ASPERCREME) 10 % cream Apply 1 application topically 2 (two) times daily as needed for muscle pain.    No facility-administered medications prior to visit.    Review of Systems  Constitutional: Negative.   HENT: Negative.    Eyes: Negative.   Respiratory: Negative.    Cardiovascular:  Positive for leg swelling.  Gastrointestinal: Negative.   Genitourinary: Negative.   Musculoskeletal: Negative.   Skin: Negative.    Neurological:  Positive for weakness.  Endo/Heme/Allergies: Negative.   Psychiatric/Behavioral: Negative.    All other systems reviewed and are negative.         Objective:     BP (!) 140/78   Pulse 71   Temp 97.7 F (36.5 C) (Oral)   Ht '5\' 6"'$  (1.676 m)   Wt 249 lb (112.9 kg)   SpO2 97%   BMI 40.19 kg/m  BP Readings from Last 3 Encounters:  03/25/22 (!) 140/78  02/04/22 124/78  09/12/21 128/68   Wt Readings from Last 3 Encounters:  03/25/22 249 lb (112.9 kg)  02/04/22 249 lb (112.9 kg)  09/12/21 252 lb (114.3 kg)      Physical Exam Vitals and nursing note reviewed.  Constitutional:      Appearance: Normal appearance. She is obese.  HENT:     Head: Normocephalic and atraumatic.     Right Ear: Tympanic membrane, ear canal and external ear normal.     Left Ear: Tympanic membrane and ear canal normal.     Ears:      Comments: Skin lesion    Nose: Nose normal.     Mouth/Throat:     Mouth: Mucous membranes are moist.     Pharynx: Oropharynx is clear.  Eyes:     Extraocular Movements: Extraocular movements intact.     Conjunctiva/sclera: Conjunctivae normal.     Pupils: Pupils are equal, round, and reactive to light.  Cardiovascular:     Rate and Rhythm: Normal rate and regular rhythm.     Pulses: Normal pulses.     Heart sounds: Normal heart sounds.  Pulmonary:     Effort: Pulmonary effort is normal.     Breath sounds: Normal breath sounds.  Abdominal:     General: Bowel sounds are normal.     Palpations: Abdomen is soft.  Musculoskeletal:        General: Swelling present.     Cervical back: Normal range of motion and neck supple.     Right lower leg: Edema present.     Left lower leg: Edema present.  Skin:    General: Skin is warm and dry.     Capillary Refill: Capillary refill takes less than 2 seconds.     Findings: Erythema (bilateral lower extremities) and lesion (left ear) present.  Neurological:     General: No focal deficit present.      Mental Status: She is alert and oriented to person, place, and time. Mental status is at baseline.     Motor: Weakness present.  Psychiatric:        Mood and Affect: Mood normal.        Behavior: Behavior normal.  Thought Content: Thought content normal.        Judgment: Judgment normal.      No results found for any visits on 03/25/22. Last CBC Lab Results  Component Value Date   WBC 5.9 10/13/2020   HGB 14.4 10/13/2020   HCT 43.0 10/13/2020   MCV 95.8 10/13/2020   MCH 32.1 10/13/2020   RDW 11.7 10/13/2020   PLT 175 49/44/9675   Last metabolic panel Lab Results  Component Value Date   GLUCOSE 86 02/23/2021   NA 142 02/23/2021   K 4.3 02/23/2021   CL 107 02/23/2021   CO2 27 02/23/2021   BUN 29 (H) 02/23/2021   CREATININE 0.80 02/23/2021   EGFR 77 02/23/2021   CALCIUM 9.5 02/23/2021   PROT 6.5 10/13/2020   ALBUMIN 3.9 06/30/2020   BILITOT 0.6 10/13/2020   ALKPHOS 73 06/30/2020   AST 14 10/13/2020   ALT 11 10/13/2020   ANIONGAP 11 06/30/2020   Last lipids Lab Results  Component Value Date   CHOL 172 10/13/2020   HDL 50 10/13/2020   LDLCALC 99 10/13/2020   TRIG 131 10/13/2020   CHOLHDL 3.4 10/13/2020   Last hemoglobin A1c Lab Results  Component Value Date   HGBA1C 5.4 06/08/2020   Last thyroid functions Lab Results  Component Value Date   TSH 3.161 06/30/2020   Last vitamin D Lab Results  Component Value Date   VD25OH 51 10/13/2020   Last vitamin B12 and Folate No results found for: "VITAMINB12", "FOLATE"      Assessment & Plan:    Routine Health Maintenance and Physical Exam  Immunization History  Administered Date(s) Administered   Fluad Quad(high Dose 65+) 12/30/2018, 12/06/2019, 01/04/2021   Influenza, High Dose Seasonal PF 01/05/2018   Influenza,inj,Quad PF,6+ Mos 02/27/2017   Moderna Sars-Covid-2 Vaccination 05/12/2019, 06/09/2019, 01/22/2020   Pneumococcal Conjugate-13 01/18/2014   Pneumococcal Polysaccharide-23 04/11/2011     Health Maintenance  Topic Date Due   DTaP/Tdap/Td (1 - Tdap) Never done   COVID-19 Vaccine (4 - 2023-24 season) 11/16/2021   Zoster Vaccines- Shingrix (1 of 2) 05/07/2022 (Originally 02/21/1965)   INFLUENZA VACCINE  06/16/2022 (Originally 10/16/2021)   Medicare Annual Wellness (AWV)  02/05/2023   Pneumonia Vaccine 31+ Years old  Completed   HPV VACCINES  Aged Out   DEXA SCAN  Discontinued   Hepatitis C Screening  Discontinued   Fecal DNA (Cologuard)  Discontinued    Discussed health benefits of physical activity, and encouraged her to engage in regular exercise appropriate for her age and condition.  Problem List Items Addressed This Visit       Cardiovascular and Mediastinum   Essential hypertension    Chronic. Mildly elevated today, pt reports stress related to today's visit and insurance. Will continue to monitor at home and report values sustained >130/80. Continue current medication regimen. Labs done today.        Other   Hyperlipidemia   Physical exam, annual - Primary    Today your medical history was reviewed and routine physical exam with labs was performed. Recommended daily exercise as tolerated and consuming a well-balanced diet. Advised to stop smoking if a smoker, avoid smoking if a non-smoker, limit alcohol consumption to 1 drink per day for women and 2 drinks per day for men, and avoid illicit drug use. Counseled on the importance of sunscreen use. Counseled in mental health awareness and when to seek medical care. Vaccine maintenance discussed. Appropriate health maintenance items reviewed. Return to office in  1 year for annual physical exam.       Return in about 1 year (around 03/26/2023) for annual physical, labs 1 week prior.     Rubie Maid, FNP

## 2022-03-25 NOTE — Assessment & Plan Note (Signed)
Chronic. Mildly elevated today, pt reports stress related to today's visit and insurance. Will continue to monitor at home and report values sustained >130/80. Continue current medication regimen. Labs done today.

## 2022-03-26 LAB — LIPID PANEL
Cholesterol: 189 mg/dL (ref <200)
HDL: 46 mg/dL — ABNORMAL LOW (ref >=50)
LDL Cholesterol (Calc): 113 mg/dL (calc) — ABNORMAL HIGH
Non-HDL Cholesterol (Calc): 143 mg/dL (calc) — ABNORMAL HIGH (ref <130)
Total CHOL/HDL Ratio: 4.1 (calc) (ref <5.0)
Triglycerides: 180 mg/dL — ABNORMAL HIGH (ref <150)

## 2022-03-26 LAB — COMPLETE METABOLIC PANEL WITH GFR
AG Ratio: 1.7 (calc) (ref 1.0–2.5)
ALT: 14 U/L (ref 6–29)
AST: 13 U/L (ref 10–35)
Albumin: 4.2 g/dL (ref 3.6–5.1)
Alkaline phosphatase (APISO): 74 U/L (ref 37–153)
BUN/Creatinine Ratio: 34 (calc) — ABNORMAL HIGH (ref 6–22)
BUN: 26 mg/dL — ABNORMAL HIGH (ref 7–25)
CO2: 27 mmol/L (ref 20–32)
Calcium: 9.3 mg/dL (ref 8.6–10.4)
Chloride: 107 mmol/L (ref 98–110)
Creat: 0.77 mg/dL (ref 0.60–1.00)
Globulin: 2.5 g/dL (calc) (ref 1.9–3.7)
Glucose, Bld: 94 mg/dL (ref 65–99)
Potassium: 4.5 mmol/L (ref 3.5–5.3)
Sodium: 141 mmol/L (ref 135–146)
Total Bilirubin: 0.6 mg/dL (ref 0.2–1.2)
Total Protein: 6.7 g/dL (ref 6.1–8.1)
eGFR: 80 mL/min/{1.73_m2} (ref >=60)

## 2022-03-26 LAB — CBC WITH DIFFERENTIAL/PLATELET
Absolute Monocytes: 417 cells/uL (ref 200–950)
Basophils Absolute: 29 cells/uL (ref 0–200)
Basophils Relative: 0.6 %
Eosinophils Absolute: 201 cells/uL (ref 15–500)
Eosinophils Relative: 4.1 %
HCT: 43.9 % (ref 35.0–45.0)
Hemoglobin: 14.9 g/dL (ref 11.7–15.5)
Lymphs Abs: 1421 cells/uL (ref 850–3900)
MCH: 32.3 pg (ref 27.0–33.0)
MCHC: 33.9 g/dL (ref 32.0–36.0)
MCV: 95 fL (ref 80.0–100.0)
MPV: 9.9 fL (ref 7.5–12.5)
Monocytes Relative: 8.5 %
Neutro Abs: 2832 cells/uL (ref 1500–7800)
Neutrophils Relative %: 57.8 %
Platelets: 191 10*3/uL (ref 140–400)
RBC: 4.62 10*6/uL (ref 3.80–5.10)
RDW: 12 % (ref 11.0–15.0)
Total Lymphocyte: 29 %
WBC: 4.9 10*3/uL (ref 3.8–10.8)

## 2022-03-26 NOTE — Addendum Note (Signed)
Addended by: Rubie Maid on: 03/26/2022 02:14 PM   Modules accepted: Level of Service

## 2022-03-26 NOTE — Addendum Note (Signed)
Addended by: Rubie Maid on: 03/26/2022 02:11 PM   Modules accepted: Level of Service

## 2022-03-28 ENCOUNTER — Other Ambulatory Visit: Payer: Self-pay | Admitting: Family Medicine

## 2022-03-28 MED ORDER — ATORVASTATIN CALCIUM 10 MG PO TABS: 10.0000 mg | ORAL_TABLET | Freq: Every day | ORAL | 0 refills | Status: DC

## 2022-04-18 ENCOUNTER — Ambulatory Visit: Payer: Medicare PPO | Admitting: Podiatry

## 2022-04-24 NOTE — Progress Notes (Signed)
Subjective:   Amanda Mckay is a 77 y.o. female who presents for Medicare Annual (Subsequent) preventive examination.  I connected with  Amanda Mckay on 04/25/22 by a audio enabled telemedicine application and verified that I am speaking with the correct person using two identifiers.  Patient Location: Home  Provider Location: Office/Clinic  I discussed the limitations of evaluation and management by telemedicine. The patient expressed understanding and agreed to proceed.  Review of Systems     Cardiac Risk Factors include: advanced age (>7mn, >>74women);hypertension;dyslipidemia     Objective:    Today's Vitals   04/25/22 1245  Weight: 249 lb (112.9 kg)  Height: 5' 6"$  (1.676 m)   Body mass index is 40.19 kg/m.     04/25/2022    1:54 PM 06/30/2020   11:39 AM 06/07/2020    6:29 PM 01/03/2020    9:31 AM 03/22/2019   10:00 AM 01/28/2019   11:48 AM 12/30/2018   11:23 AM  Advanced Directives  Does Patient Have a Medical Advance Directive? No No No No No No No  Would patient like information on creating a medical advance directive? No - Patient declined   No - Patient declined No - Patient declined No - Patient declined Yes (MAU/Ambulatory/Procedural Areas - Information given);No - Patient declined    Current Medications (verified) Outpatient Encounter Medications as of 04/25/2022  Medication Sig   acetaminophen (TYLENOL) 650 MG CR tablet Take 650-1,300 mg by mouth every 8 (eight) hours as needed for pain.   atorvastatin (LIPITOR) 10 MG tablet Take 1 tablet (10 mg total) by mouth daily.   Calcium Carb-Cholecalciferol (CALCIUM 600+D3 PO) Take 1 tablet by mouth 2 (two) times daily.   Cholecalciferol 25 MCG (1000 UT) capsule Take 1,000 Units by mouth daily.   clotrimazole (LOTRIMIN AF) 1 % cream Apply 1 application topically 2 (two) times daily. (Patient taking differently: Apply 1 application  topically 2 (two) times daily. Applied to toes twice daily for fungus)    diclofenac (CATAFLAM) 50 MG tablet TAKE ONE TABLET (50MG TOTAL) BY MOUTH TWO TIMES DAILY   ELIQUIS 5 MG TABS tablet TAKE ONE TABLET BY MOUTH TWICE A DAY   flecainide (TAMBOCOR) 150 MG tablet TAKE 0.5 TABLETS (75 MG TOTAL) BY MOUTH 2 (TWO) TIMES DAILY   furosemide (LASIX) 20 MG tablet TAKE ONE (1) TABLET BY MOUTH EVERY DAY AS NEEDED   magnesium oxide (MAG-OX) 400 MG tablet Take 400 mg by mouth daily.   metoprolol tartrate (LOPRESSOR) 50 MG tablet Take 1 tablet (50 mg total) by mouth 2 (two) times daily.   omeprazole (PRILOSEC) 20 MG capsule Take 1 capsule (20 mg total) by mouth daily.   potassium chloride (KLOR-CON) 10 MEQ tablet TAKE ONE (1) TABLET BY MOUTH EVERY DAY WITH LASIX FOR LEG CRAMPS   trolamine salicylate (ASPERCREME) 10 % cream Apply 1 application topically 2 (two) times daily as needed for muscle pain.    No facility-administered encounter medications on file as of 04/25/2022.    Allergies (verified) Tetanus toxoids   History: Past Medical History:  Diagnosis Date   Arthritis    Atrial fibrillation (HCC)    Cataract    Congestive heart failure (HCC)    Dysrhythmia    AFib   GERD (gastroesophageal reflux disease)    Hemochromatosis    Iron excess    Past Surgical History:  Procedure Laterality Date   CESAREAN SECTION     KNEE ARTHROSCOPY WITH MEDIAL MENISECTOMY Right 01/28/2019  Procedure: RIGHT KNEE ARTHROSCOPY WITH MEDIAL MENISCECTOMY;  Surgeon: Carole Civil, MD;  Location: [facility];  Service: Orthopedics;  Laterality: Right;   TONSILLECTOMY     VENTRAL HERNIA REPAIR N/A 01/26/2018   Procedure: HERNIA REPAIR VENTRAL ADULT WITH MESH;  Surgeon: Virl Cagey, MD;  Location: [facility];  Service: General;  Laterality: N/A;   Family History  Problem Relation Age of Onset   Stroke Mother    Atrial fibrillation Son    Stroke Maternal Grandmother    Diabetes Paternal Grandfather    Arthritis Brother    Social History   Socioeconomic History   Marital  status: Married    Spouse name: Not on file   Number of children: Not on file   Years of education: Not on file   Highest education level: Not on file  Occupational History   Not on file  Tobacco Use   Smoking status: Never   Smokeless tobacco: Never  Vaping Use   Vaping Use: Never used  Substance and Sexual Activity   Alcohol use: Not Currently    Alcohol/week: 2.0 standard drinks of alcohol    Types: 2 Glasses of wine per week   Drug use: No   Sexual activity: Yes  Other Topics Concern   Not on file  Social History Narrative   Retired Copywriter, advertising.   Married.   Has children 3. Grandchildren, 2.    Grew up in [place], [place].   Married for over 31 years.    Eats all food groups.   Just moved to [place], [place] in 8/18.    Social Determinants of Health   Financial Resource Strain: Low Risk  (04/25/2022)   Overall Financial Resource Strain (CARDIA)    Difficulty of Paying Living Expenses: Not hard at all  Food Insecurity: No Food Insecurity (04/25/2022)   Hunger Vital Sign    Worried About Running Out of Food in the Last Year: Never true    Ran Out of Food in the Last Year: Never true  Transportation Needs: No Transportation Needs (04/25/2022)   PRAPARE - Hydrologist (Medical): No    Lack of Transportation (Non-Medical): No  Physical Activity: Insufficiently Active (04/25/2022)   Exercise Vital Sign    Days of Exercise per Week: 2 days    Minutes of Exercise per Session: 10 min  Stress: No Stress Concern Present (04/25/2022)   [place]    Feeling of Stress : Not at all  Social Connections: [place] (04/25/2022)   Social Connection and Isolation Panel [NHANES]    Frequency of Communication with Friends and Family: More than three times a week    Frequency of Social Gatherings with Friends and Family: Twice a week    Attends Religious Services: More than 4 times per year     Active Member of [employer] or Organizations: Yes    Attends Archivist Meetings: 1 to 4 times per year    Marital Status: Married    Tobacco Counseling Counseling given: Not Answered   Clinical Intake:  Pre-visit preparation completed: Yes  Pain : No/denies pain  Diabetes: No  How often do you need to have someone help you when you read instructions, pamphlets, or other written materials from your doctor or pharmacy?: 1 - Never  Diabetic?No   Interpreter Needed?: No  Information entered by :: Denman George LPN   Activities of Daily Living  04/25/2022    1:55 PM 04/21/2022    9:00 AM  In your present state of health, do you have any difficulty performing the following activities:  Hearing? 0 0  Vision? 0 0  Difficulty concentrating or making decisions? 0 0  Walking or climbing stairs? 1 0  Dressing or bathing? 0 0  Doing errands, shopping? 0 0  Preparing Food and eating ? N N  Using the Toilet? N N  In the past six months, have you accidently leaked urine? N N  Do you have problems with loss of bowel control? N N  Managing your Medications? N N  Managing your Finances? N N  Housekeeping or managing your Housekeeping? N N    Patient Care Team: Rubie Maid, FNP as PCP - General (Family Medicine) Fay Records, MD as PCP - Cardiology (Cardiology) Gardiner Barefoot, DPM as Consulting Physician (Podiatry) Register, Luetta Nutting, PA-C as Physician Assistant (Dermatology) Odette Fraction [facility])  Indicate any recent Medical Services you may have received from other than [facility] providers in the past year (date may be approximate).     Assessment:   This is a routine wellness examination for [facility].  Hearing/Vision screen Hearing Screening - Comments:: Denies hearing difficulties  Vision Screening - Comments:: Wears rx glasses - up to date with routine eye exams   Dietary issues and exercise activities discussed: Current Exercise Habits: The patient does  not participate in regular exercise at present   Goals Addressed             This Visit's Progress    Prevent falls         Depression Screen    04/25/2022    1:52 PM 03/25/2022    9:51 AM 02/04/2022   10:48 AM 09/21/2020   10:18 AM 01/03/2020    9:27 AM 09/01/2019   11:22 AM 12/30/2018   11:20 AM  PHQ 2/9 Scores  PHQ - 2 Score 0 0 0 0 0 0 0  PHQ- 9 Score  0 0    0    Fall Risk    04/25/2022   12:46 PM 04/21/2022    9:00 AM 03/25/2022    9:51 AM 02/04/2022   10:48 AM 09/21/2020   10:18 AM  Fall Risk   Falls in the past year? 0 0 0 0 0  Number falls in past yr: 0 0   0  Injury with Fall? 0 0   0  Risk for fall due to :     Impaired balance/gait;Impaired mobility  Follow up Falls prevention discussed;Education provided;Falls evaluation completed        FALL RISK PREVENTION PERTAINING TO THE HOME:  Any stairs in or around the home? No  If so, are there any without handrails? No  Home free of loose throw rugs in walkways, pet beds, electrical cords, etc? Yes  Adequate lighting in your home to reduce risk of falls? Yes   ASSISTIVE DEVICES UTILIZED TO PREVENT FALLS:  Life alert? No  Use of a cane, walker or w/c? Yes  Grab bars in the bathroom? Yes  Shower chair or bench in shower? No  Elevated toilet seat or a handicapped toilet? Yes   TIMED UP AND GO:  Was the test performed? No . Telephonic visit   Cognitive Function:        04/25/2022    1:55 PM  6CIT Screen  What Year? 0 points  What month? 0 points  What time? 0 points  Count back from 20 0 points  Months in reverse 0 points  Repeat phrase 0 points  Total Score 0 points    Immunizations Immunization History  Administered Date(s) Administered   Fluad Quad(high Dose 65+) 12/30/2018, 12/06/2019, 01/04/2021   Influenza, High Dose Seasonal PF 01/05/2018   Influenza,inj,Quad PF,6+ Mos 02/27/2017   Moderna Sars-Covid-2 Vaccination 05/12/2019, 06/09/2019, 01/22/2020   Pneumococcal Conjugate-13 01/18/2014    Pneumococcal Polysaccharide-23 04/11/2011    TDAP status: Up to date  Flu Vaccine status: Up to date  Pneumococcal vaccine status: Up to date  Covid-19 vaccine status: Information provided on how to obtain vaccines.   Qualifies for Shingles Vaccine? Yes   Zostavax completed No   Shingrix Completed?: No.    Education has been provided regarding the importance of this vaccine. Patient has been advised to call insurance company to determine out of pocket expense if they have not yet received this vaccine. Advised may also receive vaccine at local pharmacy or Health Dept. Verbalized acceptance and understanding.  Screening Tests Health Maintenance  Topic Date Due   DTaP/Tdap/Td (1 - Tdap) Never done   COVID-19 Vaccine (4 - 2023-24 season) 11/16/2021   Zoster Vaccines- Shingrix (1 of 2) 05/07/2022 (Originally 02/21/1965)   INFLUENZA VACCINE  06/16/2022 (Originally 10/16/2021)   Medicare Annual Wellness (AWV)  04/26/2023   Pneumonia Vaccine 71+ Years old  Completed   HPV VACCINES  Aged Out   DEXA SCAN  Discontinued   Hepatitis C Screening  Discontinued   Fecal DNA (Cologuard)  Discontinued    Health Maintenance  Health Maintenance Due  Topic Date Due   DTaP/Tdap/Td (1 - Tdap) Never done   COVID-19 Vaccine (4 - 2023-24 season) 11/16/2021    Colorectal cancer screening: No longer required.   Mammogram status: No longer required due to age.  Bone Density status: Completed 12/31/19. Results reflect: Bone density results: OSTEOPENIA. Repeat every 2 years.  Lung Cancer Screening: (Low Dose CT Chest recommended if Age 2-80 years, 30 pack-year currently smoking OR have quit w/in 15years.) does not qualify.   Lung Cancer Screening Referral: n/a   Additional Screening:  Hepatitis C Screening: does qualify; Completed at next office visit   Vision Screening: Recommended annual ophthalmology exams for early detection of glaucoma and other disorders of the eye. Is the patient up to  date with their annual eye exam?  Yes  Who is the provider or what is the name of the office in which the patient attends annual eye exams? [facility]  If pt is not established with a provider, would they like to be referred to a provider to establish care? No .   Dental Screening: Recommended annual dental exams for proper oral hygiene  Community Resource Referral / Chronic Care Management: CRR required this visit?  No   CCM required this visit?  No      Plan:     I have personally reviewed and noted the following in the patient's chart:   Medical and social history Use of alcohol, tobacco or illicit drugs  Current medications and supplements including opioid prescriptions. Patient is not currently taking opioid prescriptions. Functional ability and status Nutritional status Physical activity Advanced directives List of other physicians Hospitalizations, surgeries, and ER visits in previous 12 months Vitals Screenings to include cognitive, depression, and falls Referrals and appointments  In addition, I have reviewed and discussed with patient certain preventive protocols, quality metrics, and best practice recommendations. A written personalized care plan for preventive services as  well as general preventive health recommendations were provided to patient.     Vanetta Mulders, [place]   [id number]   Due to this being a virtual visit, the after visit summary with patients personalized plan was offered to patient via mail or my-chart.  Patient would like to access on my-chart  Nurse Notes: No concerns

## 2022-04-24 NOTE — Patient Instructions (Incomplete)
Amanda Mckay , Thank you for taking time to come for your Medicare Wellness Visit. I appreciate your ongoing commitment to your health goals. Please review the following plan we discussed and let me know if I can assist you in the future.   These are the goals we discussed:  Goals   None     This is a list of the screening recommended for you and due dates:  Health Maintenance  Topic Date Due   DTaP/Tdap/Td vaccine (1 - Tdap) Never done   COVID-19 Vaccine (4 - 2023-24 season) 11/16/2021   Zoster (Shingles) Vaccine (1 of 2) 05/07/2022*   Flu Shot  06/16/2022*   Medicare Annual Wellness Visit  02/05/2023   Pneumonia Vaccine  Completed   HPV Vaccine  Aged Out   DEXA scan (bone density measurement)  Discontinued   Hepatitis C Screening: USPSTF Recommendation to screen - Ages 67-79 yo.  Discontinued   Cologuard (Stool DNA test)  Discontinued  *Topic was postponed. The date shown is not the original due date.    Advanced directives: ***  Conditions/risks identified: Aim for 30 minutes of exercise or brisk walking, 6-8 glasses of water, and 5 servings of fruits and vegetables each day.   Next appointment: Follow up in one year for your annual wellness visit    Preventive Care 65 Years and Older, Female Preventive care refers to lifestyle choices and visits with your health care provider that can promote health and wellness. What does preventive care include? A yearly physical exam. This is also called an annual well check. Dental exams once or twice a year. Routine eye exams. Ask your health care provider how often you should have your eyes checked. Personal lifestyle choices, including: Daily care of your teeth and gums. Regular physical activity. Eating a healthy diet. Avoiding tobacco and drug use. Limiting alcohol use. Practicing safe sex. Taking low-dose aspirin every day. Taking vitamin and mineral supplements as recommended by your health care provider. What happens during  an annual well check? The services and screenings done by your health care provider during your annual well check will depend on your age, overall health, lifestyle risk factors, and family history of disease. Counseling  Your health care provider may ask you questions about your: Alcohol use. Tobacco use. Drug use. Emotional well-being. Home and relationship well-being. Sexual activity. Eating habits. History of falls. Memory and ability to understand (cognition). Work and work Statistician. Reproductive health. Screening  You may have the following tests or measurements: Height, weight, and BMI. Blood pressure. Lipid and cholesterol levels. These may be checked every 5 years, or more frequently if you are over 19 years old. Skin check. Lung cancer screening. You may have this screening every year starting at age 54 if you have a 30-pack-year history of smoking and currently smoke or have quit within the past 15 years. Fecal occult blood test (FOBT) of the stool. You may have this test every year starting at age 81. Flexible sigmoidoscopy or colonoscopy. You may have a sigmoidoscopy every 5 years or a colonoscopy every 10 years starting at age 52. Hepatitis C blood test. Hepatitis B blood test. Sexually transmitted disease (STD) testing. Diabetes screening. This is done by checking your blood sugar (glucose) after you have not eaten for a while (fasting). You may have this done every 1-3 years. Bone density scan. This is done to screen for osteoporosis. You may have this done starting at age 67. Mammogram. This may be done every 1-2  years. Talk to your health care provider about how often you should have regular mammograms. Talk with your health care provider about your test results, treatment options, and if necessary, the need for more tests. Vaccines  Your health care provider may recommend certain vaccines, such as: Influenza vaccine. This is recommended every year. Tetanus,  diphtheria, and acellular pertussis (Tdap, Td) vaccine. You may need a Td booster every 10 years. Zoster vaccine. You may need this after age 41. Pneumococcal 13-valent conjugate (PCV13) vaccine. One dose is recommended after age 83. Pneumococcal polysaccharide (PPSV23) vaccine. One dose is recommended after age 37. Talk to your health care provider about which screenings and vaccines you need and how often you need them. This information is not intended to replace advice given to you by your health care provider. Make sure you discuss any questions you have with your health care provider. Document Released: 03/31/2015 Document Revised: 11/22/2015 Document Reviewed: 01/03/2015 Elsevier Interactive Patient Education  2017 [place] Prevention in the Home Falls can cause injuries. They can happen to people of all ages. There are many things you can do to make your home safe and to help prevent falls. What can I do on the outside of my home? Regularly fix the edges of walkways and driveways and fix any cracks. Remove anything that might make you trip as you walk through a door, such as a raised step or threshold. Trim any bushes or trees on the path to your home. Use bright outdoor lighting. Clear any walking paths of anything that might make someone trip, such as rocks or tools. Regularly check to see if handrails are loose or broken. Make sure that both sides of any steps have handrails. Any raised decks and porches should have guardrails on the edges. Have any leaves, snow, or ice cleared regularly. Use sand or salt on walking paths during winter. Clean up any spills in your garage right away. This includes oil or grease spills. What can I do in the bathroom? Use night lights. Install grab bars by the toilet and in the tub and shower. Do not use towel bars as grab bars. Use non-skid mats or decals in the tub or shower. If you need to sit down in the shower, use a plastic,  non-slip stool. Keep the floor dry. Clean up any water that spills on the floor as soon as it happens. Remove soap buildup in the tub or shower regularly. Attach bath mats securely with double-sided non-slip rug tape. Do not have throw rugs and other things on the floor that can make you trip. What can I do in the bedroom? Use night lights. Make sure that you have a light by your bed that is easy to reach. Do not use any sheets or blankets that are too big for your bed. They should not hang down onto the floor. Have a firm chair that has side arms. You can use this for support while you get dressed. Do not have throw rugs and other things on the floor that can make you trip. What can I do in the kitchen? Clean up any spills right away. Avoid walking on wet floors. Keep items that you use a lot in easy-to-reach places. If you need to reach something above you, use a strong step stool that has a grab bar. Keep electrical cords out of the way. Do not use floor polish or wax that makes floors slippery. If you must use wax, use non-skid floor  wax. Do not have throw rugs and other things on the floor that can make you trip. What can I do with my stairs? Do not leave any items on the stairs. Make sure that there are handrails on both sides of the stairs and use them. Fix handrails that are broken or loose. Make sure that handrails are as long as the stairways. Check any carpeting to make sure that it is firmly attached to the stairs. Fix any carpet that is loose or worn. Avoid having throw rugs at the top or bottom of the stairs. If you do have throw rugs, attach them to the floor with carpet tape. Make sure that you have a light switch at the top of the stairs and the bottom of the stairs. If you do not have them, ask someone to add them for you. What else can I do to help prevent falls? Wear shoes that: Do not have high heels. Have rubber bottoms. Are comfortable and fit you well. Are closed  at the toe. Do not wear sandals. If you use a stepladder: Make sure that it is fully opened. Do not climb a closed stepladder. Make sure that both sides of the stepladder are locked into place. Ask someone to hold it for you, if possible. Clearly mark and make sure that you can see: Any grab bars or handrails. First and last steps. Where the edge of each step is. Use tools that help you move around (mobility aids) if they are needed. These include: Canes. Walkers. Scooters. Crutches. Turn on the lights when you go into a dark area. Replace any light bulbs as soon as they burn out. Set up your furniture so you have a clear path. Avoid moving your furniture around. If any of your floors are uneven, fix them. If there are any pets around you, be aware of where they are. Review your medicines with your doctor. Some medicines can make you feel dizzy. This can increase your chance of falling. Ask your doctor what other things that you can do to help prevent falls. This information is not intended to replace advice given to you by your health care provider. Make sure you discuss any questions you have with your health care provider. Document Released: 12/29/2008 Document Revised: 08/10/2015 Document Reviewed: 04/08/2014 Elsevier Interactive Patient Education  2017 [employer].

## 2022-04-25 ENCOUNTER — Ambulatory Visit (INDEPENDENT_AMBULATORY_CARE_PROVIDER_SITE_OTHER): Payer: Medicare PPO

## 2022-04-25 VITALS — Ht 66.0 in | Wt 249.0 lb

## 2022-04-25 DIAGNOSIS — Z0000 Encounter for general adult medical examination without abnormal findings: Secondary | ICD-10-CM | POA: Diagnosis not present

## 2022-04-26 ENCOUNTER — Ambulatory Visit: Payer: Medicare PPO | Admitting: Podiatry

## 2022-04-26 ENCOUNTER — Encounter: Payer: Self-pay | Admitting: Podiatry

## 2022-04-26 VITALS — BP 170/116 | HR 76

## 2022-04-26 DIAGNOSIS — B351 Tinea unguium: Secondary | ICD-10-CM | POA: Diagnosis not present

## 2022-04-26 DIAGNOSIS — M79675 Pain in left toe(s): Secondary | ICD-10-CM | POA: Diagnosis not present

## 2022-04-26 DIAGNOSIS — M79674 Pain in right toe(s): Secondary | ICD-10-CM

## 2022-04-26 NOTE — Progress Notes (Signed)
This patient returns to the office for evaluation and treatment of long thick painful nails .  This patient is unable to trim her own nails since the patient cannot reach her feet.  Patient says the nails are painful walking and wearing her shoes.  Sh e returns for preventive foot care services.  General Appearance  Alert, conversant and in no acute stress.  Vascular  Dorsalis pedis and posterior tibial  pulses are not  palpable due to swelling legs/feet.  bilaterally.  Capillary return is within normal limits  Bilaterally.Cold feet  Bilaterally.  Absent digital hair  B/L.  Neurologic  Senn-Weinstein monofilament wire test within normal limits  bilaterally. Muscle power within normal limits bilaterally.  Nails Thick disfigured discolored nails with subungual debris  from hallux to fifth toes bilaterally. No evidence of bacterial infection or drainage bilaterally. Pincer hallux nails  B/L.  Orthopedic  No limitations of motion  feet .  No crepitus or effusions noted. Hammer toes  B/l  Skin  normotropic skin with no porokeratosis noted bilaterally.  No signs of infections or ulcers noted.     Onychomycosis  Pain in toes right foot  Pain in toes left foot  Debridement  of nails  1-5  B/L with a nail nipper.  Nails were then filed using a dremel tool with no incidents.    RTC 10 weeks    Boneta Lucks D.P.M.

## 2022-05-02 ENCOUNTER — Telehealth (INDEPENDENT_AMBULATORY_CARE_PROVIDER_SITE_OTHER): Payer: Self-pay | Admitting: *Deleted

## 2022-05-02 ENCOUNTER — Encounter (INDEPENDENT_AMBULATORY_CARE_PROVIDER_SITE_OTHER): Payer: Self-pay | Admitting: Gastroenterology

## 2022-05-02 ENCOUNTER — Ambulatory Visit (INDEPENDENT_AMBULATORY_CARE_PROVIDER_SITE_OTHER): Payer: Medicare PPO | Admitting: Gastroenterology

## 2022-05-02 VITALS — BP 133/62 | HR 76 | Temp 98.0°F | Ht 66.0 in | Wt 251.8 lb

## 2022-05-02 DIAGNOSIS — R195 Other fecal abnormalities: Secondary | ICD-10-CM | POA: Insufficient documentation

## 2022-05-02 NOTE — Telephone Encounter (Signed)
    05/02/22  Amanda Mckay Oct 14, 1945  What type of surgery is being performed? Colonoscopy  When is surgery scheduled? TBD  What type of clearance is required (medical or pharmacy to hold medication or both? both  Are there any medications that need to be held prior to surgery and how long? Eliquis x 2 days prior   Name of physician performing surgery?  Dr. Maylon Peppers [facility] at [facility] Phone: [phone] Fax: [phone]  Anethesia type (none, local, MAC, general)? MAC

## 2022-05-02 NOTE — Progress Notes (Addendum)
Referring Provider: Rubie Maid, FNP Primary Care Physician:  Rubie Maid, FNP Primary GI Physician: new   Chief Complaint  Patient presents with   positive cologuard    Referred for positive cologuard. Reports had a colonoscopy over 10 years ago in [place].    HPI:   Amanda Mckay is a 77 y.o. female with past medical history of arthritis, a fib, CHF, GERD, hemochromatosis.   Patient presenting today as a new patient for positive cologuard in December.  Last labs in January 2024 with hgb 14.9, electrolytes and LFTs WNL  Patient reports last colonoscopy >10 years ago, normal. No family history of CRC or polyps that she knows of. She does note previously cologuard 3 years ago was negative. Notes more gas and "rumbling" in her stomach since a hernia repair a few years ago. Stools remain somewhat looser at baseline. She does note some water that comes out with her stools at times. She is waking up some in the night to have a BM and stools are somewhat watery then as well but this has been ongoing for some time. Denies rectal bleeding or melena. Denies changes in appetite or weight loss. No abdominal pain, nausea or vomiting.  Previously having issues with GERD, taking omeprazole prescribed by her PCP which helped. She is not taking this now as she started to notice more symptoms at night. No issues with GERD at this time. No dysphagia.   NSAID use: none  Social hx: no tobacco, occasional drink 3-4x/month Fam hx:no CRC or liver disease   Last Colonoscopy:>10 years ago in [place] Endoscopy:never    Past Medical History:  Diagnosis Date   Arthritis    Atrial fibrillation ([place])    Cataract    Congestive heart failure (HCC)    Dysrhythmia    AFib   GERD (gastroesophageal reflux disease)    Hemochromatosis    Iron excess     Past Surgical History:  Procedure Laterality Date   CESAREAN SECTION     KNEE ARTHROSCOPY WITH MEDIAL MENISECTOMY Right  01/28/2019   Procedure: RIGHT KNEE ARTHROSCOPY WITH MEDIAL MENISCECTOMY;  Surgeon: Carole Civil, MD;  Location: [facility];  Service: Orthopedics;  Laterality: Right;   TONSILLECTOMY     VENTRAL HERNIA REPAIR N/A 01/26/2018   Procedure: HERNIA REPAIR VENTRAL ADULT WITH MESH;  Surgeon: Virl Cagey, MD;  Location: [facility];  Service: General;  Laterality: N/A;    Current Outpatient Medications  Medication Sig Dispense Refill   acetaminophen (TYLENOL) 650 MG CR tablet Take 650-1,300 mg by mouth every 8 (eight) hours as needed for pain.     atorvastatin (LIPITOR) 10 MG tablet Take 1 tablet (10 mg total) by mouth daily. 30 tablet 0   Calcium Carb-Cholecalciferol (CALCIUM 600+D3 PO) Take 1 tablet by mouth 2 (two) times daily.     Cholecalciferol 25 MCG (1000 UT) capsule Take 1,000 Units by mouth daily.     clotrimazole (LOTRIMIN AF) 1 % cream Apply 1 application topically 2 (two) times daily. (Patient taking differently: Apply 1 application  topically 2 (two) times daily. Applied to toes twice daily for fungus) 30 g 1   diclofenac (CATAFLAM) 50 MG tablet TAKE ONE TABLET (50MG TOTAL) BY MOUTH TWO TIMES DAILY 60 tablet 2   ELIQUIS 5 MG TABS tablet TAKE ONE TABLET BY MOUTH TWICE A DAY 180 tablet 3   flecainide (TAMBOCOR) 150 MG tablet TAKE 0.5 TABLETS (75 MG TOTAL) BY MOUTH 2 (TWO)  TIMES DAILY 90 tablet 3   furosemide (LASIX) 20 MG tablet TAKE ONE (1) TABLET BY MOUTH EVERY DAY AS NEEDED 90 tablet 3   magnesium oxide (MAG-OX) 400 MG tablet Take 400 mg by mouth daily.     metoprolol tartrate (LOPRESSOR) 50 MG tablet Take 1 tablet (50 mg total) by mouth 2 (two) times daily. 180 tablet 3   potassium chloride (KLOR-CON) 10 MEQ tablet TAKE ONE (1) TABLET BY MOUTH EVERY DAY WITH LASIX FOR LEG CRAMPS 90 tablet 2   trolamine salicylate (ASPERCREME) 10 % cream Apply 1 application topically 2 (two) times daily as needed for muscle pain.      No current facility-administered medications for this visit.     Allergies as of 05/02/2022 - Review Complete 05/02/2022  Allergen Reaction Noted   Tetanus toxoids Other (See Comments) 02/27/2017    Family History  Problem Relation Age of Onset   Stroke Mother    Atrial fibrillation Son    Stroke Maternal Grandmother    Diabetes Paternal Grandfather    Arthritis Brother     Social History   Socioeconomic History   Marital status: Married    Spouse name: Not on file   Number of children: Not on file   Years of education: Not on file   Highest education level: Not on file  Occupational History   Not on file  Tobacco Use   Smoking status: Never    Passive exposure: Past   Smokeless tobacco: Never  Vaping Use   Vaping Use: Never used  Substance and Sexual Activity   Alcohol use: Yes    Alcohol/week: 2.0 standard drinks of alcohol    Types: 2 Glasses of wine per week    Comment: sometimes   Drug use: No   Sexual activity: Yes  Other Topics Concern   Not on file  Social History Narrative   Retired Copywriter, advertising.   Married.   Has children 3. Grandchildren, 2.    Grew up in [place], [place].   Married for over 31 years.    Eats all food groups.   Just moved to [place], [place] in 8/18.    Social Determinants of Health   Financial Resource Strain: Low Risk  (04/25/2022)   Overall Financial Resource Strain (CARDIA)    Difficulty of Paying Living Expenses: Not hard at all  Food Insecurity: No Food Insecurity (04/25/2022)   Hunger Vital Sign    Worried About Running Out of Food in the Last Year: Never true    Ran Out of Food in the Last Year: Never true  Transportation Needs: No Transportation Needs (04/25/2022)   PRAPARE - Hydrologist (Medical): No    Lack of Transportation (Non-Medical): No  Physical Activity: Insufficiently Active (04/25/2022)   Exercise Vital Sign    Days of Exercise per Week: 2 days    Minutes of Exercise per Session: 10 min  Stress: No Stress Concern Present (04/25/2022)   [place]    Feeling of Stress : Not at all  Social Connections: [place] (04/25/2022)   Social Connection and Isolation Panel [NHANES]    Frequency of Communication with Friends and Family: More than three times a week    Frequency of Social Gatherings with Friends and Family: Twice a week    Attends Religious Services: More than 4 times per year    Active Member of [employer] or Organizations: Yes  Attends Archivist Meetings: 1 to 4 times per year    Marital Status: Married   Review of systems General: negative for malaise, night sweats, fever, chills, weight loss Neck: Negative for lumps, goiter, pain and significant neck swelling Resp: Negative for cough, wheezing, dyspnea at rest CV: Negative for chest pain, leg swelling, palpitations, orthopnea GI: denies melena, hematochezia, nausea, vomiting, constipation, dysphagia, odyonophagia, early satiety or unintentional weight loss. +loose stools/diarrhea MSK: Negative for joint pain or swelling, back pain, and muscle pain. Derm: Negative for itching or rash Psych: Denies depression, anxiety, memory loss, confusion. No homicidal or suicidal ideation.  Heme: Negative for prolonged bleeding, bruising easily, and swollen nodes. Endocrine: Negative for cold or heat intolerance, polyuria, polydipsia and goiter. Neuro: negative for tremor, gait imbalance, syncope and seizures. The remainder of the review of systems is noncontributory.  Physical Exam: BP 133/62 (BP Location: Right Arm, Patient Position: Sitting, Cuff Size: Large)   Pulse 76   Temp 98 F (36.7 C) (Oral)   Ht 5' 6"$  (1.676 m)   Wt 251 lb 12.8 oz (114.2 kg)   BMI 40.64 kg/m  General:   Alert and oriented. No distress noted. Pleasant and cooperative.   Head:  Normocephalic and atraumatic. Eyes:  Conjuctiva clear without scleral icterus. Mouth:  Oral mucosa pink and moist. Good dentition. No  lesions. Heart: Normal rate and rhythm, s1 and s2 heart sounds present.  Lungs: Clear lung sounds in all lobes. Respirations equal and unlabored. Abdomen:  +BS, soft, non-tender and non-distended. No rebound or guarding. No HSM or masses noted. Derm: No palmar erythema or jaundice Msk:  Symmetrical without gross deformities. Normal posture. Extremities:  Without edema. Neurologic:  Alert and  oriented x4 Psych:  Alert and cooperative. Normal mood and affect.  Invalid input(s): "6 MONTHS"   ASSESSMENT: Rickiyah Greenwald is a 77 y.o. female presenting today for positive cologuard  Positive cologuard in December. No overt GI bleeding or changes in bowel habits. Last TCS >10 years ago. I discussed with patient Cologuard is intended for the qualitative detection of colorectal neoplasia associated DNA markers and for the presence of occult hemoglobin in human stool. A positive result may indicate the presence of colorectal cancer (CRC) or pre cancerous polyps, and should be followed by colonoscopy. A negative Cologuard test result does not guarantee absence of cancer or pre cancerous polyp.False positives and false negatives do occur.  Recommend proceeding with colonoscopy. Indications, risks and benefits of procedure discussed in detail with patient. Patient verbalized understanding and is in agreement to proceed with colonoscopy.   As patient has history of PAF/CHF, maintained on both eliquis and Flecainide, will obtain cardiac clearance prior to procedure. I did discuss this with the patient.    PLAN:  Colonoscopy-ASA III ENDO 3  2. Cardiac clearance prior to procedures due to flecainide/eliquis   All questions were answered, patient verbalized understanding and is in agreement with plan as outlined above.    Follow Up: TBD   Bristol Soy L. Alver Sorrow, MSN, APRN, AGNP-C Adult-Gerontology Nurse Practitioner [facility]  I have reviewed the note and agree with the  APP's assessment as described in this progress note  Maylon Peppers, MD Gastroenterology and Hepatology [facility] Gastroenterology

## 2022-05-02 NOTE — Patient Instructions (Addendum)
As discussed, Cologuard is intended for the qualitative detection of colorectal neoplasia associated DNA markers and for the presence of occult hemoglobin in human stool. A positive result may indicate the presence of colorectal cancer (CRC) or pre cancerous polyps, and should be followed by colonoscopy. A negative Cologuard test result does not guarantee absence of cancer or pre cancerous polyp. False positives and false negatives do occur.  We will schedule you for colonoscopy after we obtain cardiology clearance.  It was a pleasure to meet you today. I want to create trusting relationships with patients and provide genuine, compassionate, and quality care. I truly value your feedback! please be on the lookout for a survey regarding your visit with me today. I appreciate your input about our visit and your time in completing this!    Ezekeil Bethel L. Alver Sorrow, MSN, APRN, AGNP-C Adult-Gerontology Nurse Practitioner [facility] Gastroenterology at [facility]

## 2022-05-03 NOTE — Telephone Encounter (Signed)
Recommendations for holding Eliquis prior to Colonoscopy.   Thank you!  DW

## 2022-05-07 ENCOUNTER — Telehealth: Payer: Self-pay | Admitting: *Deleted

## 2022-05-07 NOTE — Telephone Encounter (Signed)
Patient with diagnosis of afib on Eliquis for anticoagulation.    Procedure: colonoscopy Date of procedure: TBD  CHA2DS2-VASc Score = 5  This indicates a 7.2% annual risk of stroke. The patient's score is based upon: CHF History: 1 HTN History: 1 Diabetes History: 0 Stroke History: 0 Vascular Disease History: 0 Age Score: 2 Gender Score: 1   CrCl 71m/min using adjusted body weight due to obesity Platelet count 191K  Per office protocol, patient can hold Eliquis for 2 days prior to procedure.    **This guidance is not considered finalized until pre-operative APP has relayed final recommendations.**

## 2022-05-07 NOTE — Telephone Encounter (Signed)
   Name: Amanda Mckay  DOB: 1945-08-07  MRN: [id number]  Primary Cardiologist: Dorris Carnes, MD   Preoperative team, please contact this patient and set up a phone call appointment for further preoperative risk assessment. Please obtain consent and complete medication review. Thank you for your help.  I confirm that guidance regarding antiplatelet and oral anticoagulation therapy has been completed and, if necessary, noted below. Per pharm D: Patient with diagnosis of afib on Eliquis for anticoagulation.     Procedure: colonoscopy Date of procedure: TBD   CHA2DS2-VASc Score = 5  This indicates a 7.2% annual risk of stroke. The patient's score is based upon: CHF History: 1 HTN History: 1 Diabetes History: 0 Stroke History: 0 Vascular Disease History: 0 Age Score: 2 Gender Score: 1   CrCl 35m/min using adjusted body weight due to obesity Platelet count 191K   Per office protocol, patient can hold Eliquis for 2 days prior to procedure.     DMayra Reel NP 05/07/2022, 1:06 PM [place]

## 2022-05-07 NOTE — Telephone Encounter (Signed)
Pt has been scheduled for a tele visit, 05/13/22 2:20.  Consent on file / medications reconciled.    Patient Consent for Virtual Visit        Amanda Mckay has provided verbal consent on 05/07/2022 for a virtual visit (video or telephone).   CONSENT FOR VIRTUAL VISIT FOR:  Amanda Mckay  By participating in this virtual visit I agree to the following:  I hereby voluntarily request, consent and authorize [facility] and its employed or contracted physicians, physician assistants, nurse practitioners or other licensed health care professionals (the Practitioner), to provide me with telemedicine health care services (the "Services") as deemed necessary by the treating Practitioner. I acknowledge and consent to receive the Services by the Practitioner via telemedicine. I understand that the telemedicine visit will involve communicating with the Practitioner through live audiovisual communication technology and the disclosure of certain medical information by electronic transmission. I acknowledge that I have been given the opportunity to request an in-person assessment or other available alternative prior to the telemedicine visit and am voluntarily participating in the telemedicine visit.  I understand that I have the right to withhold or withdraw my consent to the use of telemedicine in the course of my care at any time, without affecting my right to future care or treatment, and that the Practitioner or I may terminate the telemedicine visit at any time. I understand that I have the right to inspect all information obtained and/or recorded in the course of the telemedicine visit and may receive copies of available information for a reasonable fee.  I understand that some of the potential risks of receiving the Services via telemedicine include:  Delay or interruption in medical evaluation due to technological equipment failure or disruption; Information transmitted may not be  sufficient (e.g. poor resolution of images) to allow for appropriate medical decision making by the Practitioner; and/or  In rare instances, security protocols could fail, causing a breach of personal health information.  Furthermore, I acknowledge that it is my responsibility to provide information about my medical history, conditions and care that is complete and accurate to the best of my ability. I acknowledge that Practitioner's advice, recommendations, and/or decision may be based on factors not within their control, such as incomplete or inaccurate data provided by me or distortions of diagnostic images or specimens that may result from electronic transmissions. I understand that the practice of medicine is not an exact science and that Practitioner makes no warranties or guarantees regarding treatment outcomes. I acknowledge that a copy of this consent can be made available to me via my patient portal ([place]), or I can request a printed copy by calling the office of [facility].    I understand that my insurance will be billed for this visit.   I have read or had this consent read to me. I understand the contents of this consent, which adequately explains the benefits and risks of the Services being provided via telemedicine.  I have been provided ample opportunity to ask questions regarding this consent and the Services and have had my questions answered to my satisfaction. I give my informed consent for the services to be provided through the use of telemedicine in my medical care

## 2022-05-07 NOTE — Telephone Encounter (Signed)
Pt has been scheduled for a tele visit, 05/13/22 2:20.  Consent on file / medications reconciled.

## 2022-05-13 ENCOUNTER — Ambulatory Visit: Payer: Medicare PPO | Attending: Cardiology | Admitting: Physician Assistant

## 2022-05-13 DIAGNOSIS — Z01810 Encounter for preprocedural cardiovascular examination: Secondary | ICD-10-CM

## 2022-05-13 NOTE — Progress Notes (Signed)
Virtual Visit via Telephone Note   Because of Amanda Mckay's co-morbid illnesses, she is at least at moderate risk for complications without adequate follow up.  This format is felt to be most appropriate for this patient at this time.  The patient did not have access to video technology/had technical difficulties with video requiring transitioning to audio format only (telephone).  All issues noted in this document were discussed and addressed.  No physical exam could be performed with this format.  Please refer to the patient's chart for her consent to telehealth for [facility].  Evaluation Performed:  Preoperative cardiovascular risk assessment _____________   Date:  05/13/2022   Patient ID:  Amanda, Mckay 1945/12/27, MRN [id number] Patient Location:  Home Provider location:   Office  Primary Care Provider:  Rubie Maid, FNP Primary Cardiologist:  Dorris Carnes, MD  Chief Complaint / Patient Profile   77 y.o. y/o female with a h/o PAF, HLD, GERD, hemachromatosis who is pending colonoscopy and presents today for telephonic preoperative cardiovascular risk assessment.  History of Present Illness    Amanda Mckay is a 77 y.o. female who presents via audio/video conferencing for a telehealth visit today.  Pt was last seen in cardiology clinic on 09/12/21 by Bernerd Pho PA-C.  At that time Amanda Mckay was doing well.  Prior echo 2022 showed normal EF, G1DD. Stress test was not previously pursued due to pt's inability to walk on treadmill. She walks with a rolling walker and has chronic DOE. The patient is now pending procedure as outlined above and medical/pharmacy clearance was requested. Since her last visit, she reports she is doing well without any new complaints. She is able to participate in ADLs/housekeeping and small tasks in her flower garden. No recent DCCV procedures.  Past Medical History    Past Medical History:  Diagnosis  Date   Arthritis    Atrial fibrillation ([place])    Cataract    Congestive heart failure (HCC)    Dysrhythmia    AFib   GERD (gastroesophageal reflux disease)    Hemochromatosis    Iron excess    Past Surgical History:  Procedure Laterality Date   CESAREAN SECTION     KNEE ARTHROSCOPY WITH MEDIAL MENISECTOMY Right 01/28/2019   Procedure: RIGHT KNEE ARTHROSCOPY WITH MEDIAL MENISCECTOMY;  Surgeon: Carole Civil, MD;  Location: [facility];  Service: Orthopedics;  Laterality: Right;   TONSILLECTOMY     VENTRAL HERNIA REPAIR N/A 01/26/2018   Procedure: HERNIA REPAIR VENTRAL ADULT WITH MESH;  Surgeon: Virl Cagey, MD;  Location: [facility];  Service: General;  Laterality: N/A;    Allergies  Allergies  Allergen Reactions   Tetanus Toxoids Other (See Comments)    Reaction occurred more than 40 yrs ago--does not remember what occurred.    Home Medications    Prior to Admission medications   Medication Sig Start Date End Date Taking? Authorizing Provider  acetaminophen (TYLENOL) 650 MG CR tablet Take 650-1,300 mg by mouth every 8 (eight) hours as needed for pain.    [provider]  atorvastatin (LIPITOR) 10 MG tablet Take 1 tablet (10 mg total) by mouth daily. 03/28/22   Rubie Maid, FNP  Calcium Carb-Cholecalciferol (CALCIUM 600+D3 PO) Take 1 tablet by mouth 2 (two) times daily.    [provider]  Cholecalciferol 25 MCG (1000 UT) capsule Take 1,000 Units by mouth daily.    [provider]  clotrimazole (LOTRIMIN AF)  1 % cream Apply 1 application topically 2 (two) times daily. Patient taking differently: Apply 1 application  topically 2 (two) times daily. Applied to toes twice daily for fungus 12/28/18   Susy Frizzle, MD  diclofenac (CATAFLAM) 50 MG tablet TAKE ONE TABLET ('50MG'$  TOTAL) BY MOUTH TWO TIMES DAILY 02/13/22   Carole Civil, MD  ELIQUIS 5 MG TABS tablet TAKE ONE TABLET BY MOUTH TWICE A DAY 07/26/21   Strader, Fransisco Hertz, PA-C   flecainide (TAMBOCOR) 150 MG tablet TAKE 0.5 TABLETS (75 MG TOTAL) BY MOUTH 2 (TWO) TIMES DAILY 07/26/21   Strader, Fransisco Hertz, PA-C  furosemide (LASIX) 20 MG tablet TAKE ONE (1) TABLET BY MOUTH EVERY DAY AS NEEDED 04/19/21   Noemi Chapel A, NP  magnesium oxide (MAG-OX) 400 MG tablet Take 400 mg by mouth daily.    [provider]  metoprolol tartrate (LOPRESSOR) 50 MG tablet Take 1 tablet (50 mg total) by mouth 2 (two) times daily. 09/12/21   Strader, Fransisco Hertz, PA-C  potassium chloride (KLOR-CON) 10 MEQ tablet TAKE ONE (1) TABLET BY MOUTH EVERY DAY WITH LASIX FOR LEG CRAMPS 04/19/21   Noemi Chapel A, NP  trolamine salicylate (ASPERCREME) 10 % cream Apply 1 application topically 2 (two) times daily as needed for muscle pain.     [provider]    Physical Exam    Vital Signs:  Amanda Mckay does not have vital signs available for review today.  Given telephonic nature of communication, physical exam is limited. AAOx3. NAD. Normal affect.  Speech and respirations are unlabored.  Accessory Clinical Findings    None  Assessment & Plan    1.  Preoperative Cardiovascular Risk Assessment: The patient affirms she has been doing well without any new cardiac symptoms. Her functional capacity is at baseline. Therefore, based on ACC/AHA guidelines, the patient would be at acceptable risk for the planned procedure without further cardiovascular testing. The patient was advised that if she develops new symptoms prior to surgery to contact our office to arrange for a follow-up visit, and she verbalized understanding.  Per pharmD review, per office protocol, patient can hold Eliquis for 2 days prior to procedure.      A copy of this note will be routed to requesting surgeon.  Time:   Today, I have spent 5 minutes with the patient with telehealth technology discussing medical history, symptoms, and management plan.     Charlie Pitter, PA-C  05/13/2022, 7:31 AM

## 2022-05-15 NOTE — Telephone Encounter (Signed)
See 2/26 visit regarding scheduling for procedure

## 2022-05-16 ENCOUNTER — Encounter: Payer: Self-pay | Admitting: Radiology

## 2022-05-16 ENCOUNTER — Other Ambulatory Visit: Payer: Self-pay | Admitting: Orthopedic Surgery

## 2022-05-16 MED ORDER — PEG 3350-KCL-NA BICARB-NACL 420 G PO SOLR: 4000.0000 mL | Freq: Once | ORAL | 0 refills | Status: AC

## 2022-05-16 MED ORDER — DICLOFENAC POTASSIUM 50 MG PO TABS: ORAL_TABLET | ORAL | 2 refills | Status: DC

## 2022-05-16 NOTE — Telephone Encounter (Signed)
Refill request received via fax for [facility]  Diclofenac If you want to refill we can put on the form they sent [place] I put on your keyboard

## 2022-05-16 NOTE — Addendum Note (Signed)
Addended by: Vicente Males on: 05/16/2022 11:01 AM   Modules accepted: Orders

## 2022-05-16 NOTE — Telephone Encounter (Signed)
Instructions sent to pt via my chart. Prep sent to [facility]. PA submitted Authorization [id number] Dates of service 06/13/2022 - 08/13/2022

## 2022-05-17 NOTE — Telephone Encounter (Signed)
Pt contacted and made aware of arrival time and date of pre op [place] In person 06/11/22 @ 11:00)

## 2022-05-20 ENCOUNTER — Other Ambulatory Visit: Payer: Self-pay | Admitting: Family Medicine

## 2022-05-20 MED ORDER — ATORVASTATIN CALCIUM 10 MG PO TABS: 10.0000 mg | ORAL_TABLET | Freq: Every day | ORAL | 3 refills | Status: DC

## 2022-06-07 NOTE — Patient Instructions (Signed)
   Your procedure is scheduled on: 06/13/2022  Report to [facility] at    8:45 AM.  Call this number if you have problems the morning of surgery: [phone]   Remember:              Follow Directions on the letter you received from Your Physician's office regarding the Bowel Prep              No Smoking the day of Procedure :   Take these medicines the morning of surgery with A SIP OF WATER: metoprolol, omeprazole, and flecainide  Hold Eliquis as instructed for 2 days prior to procedure last dose 06/10/2022   Do not wear jewelry, make-up or nail polish.    Do not bring valuables to the hospital.  Contacts, dentures or bridgework may not be worn into surgery.  .   Patients discharged the day of surgery will not be allowed to drive home.     Colonoscopy, Adult, Care After This sheet gives you information about how to care for yourself after your procedure. Your health care provider may also give you more specific instructions. If you have problems or questions, contact your health care provider. What can I expect after the procedure? After the procedure, it is common to have: A small amount of blood in your stool for 24 hours after the procedure. Some gas. Mild abdominal cramping or bloating.  Follow these instructions at home: General instructions  For the first 24 hours after the procedure: Do not drive or use machinery. Do not sign important documents. Do not drink alcohol. Do your regular daily activities at a slower pace than normal. Eat soft, easy-to-digest foods. Rest often. Take over-the-counter or prescription medicines only as told by your health care provider. It is up to you to get the results of your procedure. Ask your health care provider, or the department performing the procedure, when your results will be ready. Relieving cramping and bloating Try walking around when you have cramps or feel bloated. Apply heat to your abdomen as told by your  health care provider. Use a heat source that your health care provider recommends, such as a moist heat pack or a heating pad. Place a towel between your skin and the heat source. Leave the heat on for 20-30 minutes. Remove the heat if your skin turns bright red. This is especially important if you are unable to feel pain, heat, or cold. You may have a greater risk of getting burned. Eating and drinking Drink enough fluid to keep your urine clear or pale yellow. Resume your normal diet as instructed by your health care provider. Avoid heavy or fried foods that are hard to digest. Avoid drinking alcohol for as long as instructed by your health care provider. Contact a health care provider if: You have blood in your stool 2-3 days after the procedure. Get help right away if: You have more than a small spotting of blood in your stool. You pass large blood clots in your stool. Your abdomen is swollen. You have nausea or vomiting. You have a fever. You have increasing abdominal pain that is not relieved with medicine. This information is not intended to replace advice given to you by your health care provider. Make sure you discuss any questions you have with your health care provider. Document Released: 10/17/2003 Document Revised: 11/27/2015 Document Reviewed: 05/16/2015 Elsevier Interactive Patient Education  [employer].

## 2022-06-11 ENCOUNTER — Encounter (HOSPITAL_COMMUNITY)
Admission: RE | Admit: 2022-06-11 | Discharge: 2022-06-11 | Disposition: A | Discharge status: Home / Self Care | Payer: Medicare PPO | Source: Ambulatory Visit | Attending: Gastroenterology | Admitting: Gastroenterology

## 2022-06-11 ENCOUNTER — Encounter (HOSPITAL_COMMUNITY): Payer: Self-pay

## 2022-06-11 VITALS — BP 133/78 | HR 72 | Temp 97.7°F | Resp 18 | Ht 66.0 in | Wt 251.8 lb

## 2022-06-11 DIAGNOSIS — I480 Paroxysmal atrial fibrillation: Secondary | ICD-10-CM | POA: Diagnosis not present

## 2022-06-11 HISTORY — DX: Spondylosis without myelopathy or radiculopathy, lumbar region: M47.816

## 2022-06-13 ENCOUNTER — Ambulatory Visit (HOSPITAL_BASED_OUTPATIENT_CLINIC_OR_DEPARTMENT_OTHER): Payer: Medicare PPO | Admitting: Anesthesiology

## 2022-06-13 ENCOUNTER — Ambulatory Visit (HOSPITAL_COMMUNITY)
Admission: RE | Admit: 2022-06-13 | Discharge: 2022-06-13 | Disposition: A | Discharge status: Home / Self Care | Payer: Medicare PPO | Attending: Gastroenterology | Admitting: Gastroenterology

## 2022-06-13 ENCOUNTER — Encounter (HOSPITAL_COMMUNITY): Payer: Self-pay | Admitting: Gastroenterology

## 2022-06-13 ENCOUNTER — Ambulatory Visit (HOSPITAL_COMMUNITY): Payer: Medicare PPO | Admitting: Anesthesiology

## 2022-06-13 ENCOUNTER — Encounter (INDEPENDENT_AMBULATORY_CARE_PROVIDER_SITE_OTHER): Payer: Self-pay | Admitting: *Deleted

## 2022-06-13 ENCOUNTER — Encounter (HOSPITAL_COMMUNITY)
Admission: RE | Disposition: A | Discharge status: Home / Self Care | Payer: Self-pay | Source: Home / Self Care | Attending: Gastroenterology

## 2022-06-13 DIAGNOSIS — I509 Heart failure, unspecified: Secondary | ICD-10-CM | POA: Insufficient documentation

## 2022-06-13 DIAGNOSIS — M1990 Unspecified osteoarthritis, unspecified site: Secondary | ICD-10-CM | POA: Diagnosis not present

## 2022-06-13 DIAGNOSIS — R195 Other fecal abnormalities: Secondary | ICD-10-CM | POA: Diagnosis not present

## 2022-06-13 DIAGNOSIS — D122 Benign neoplasm of ascending colon: Secondary | ICD-10-CM | POA: Insufficient documentation

## 2022-06-13 DIAGNOSIS — K648 Other hemorrhoids: Secondary | ICD-10-CM | POA: Diagnosis not present

## 2022-06-13 DIAGNOSIS — I480 Paroxysmal atrial fibrillation: Secondary | ICD-10-CM

## 2022-06-13 DIAGNOSIS — D120 Benign neoplasm of cecum: Secondary | ICD-10-CM | POA: Diagnosis not present

## 2022-06-13 DIAGNOSIS — I110 Hypertensive heart disease with heart failure: Secondary | ICD-10-CM | POA: Insufficient documentation

## 2022-06-13 DIAGNOSIS — Z1211 Encounter for screening for malignant neoplasm of colon: Secondary | ICD-10-CM | POA: Insufficient documentation

## 2022-06-13 DIAGNOSIS — I4891 Unspecified atrial fibrillation: Secondary | ICD-10-CM | POA: Diagnosis not present

## 2022-06-13 DIAGNOSIS — K5730 Diverticulosis of large intestine without perforation or abscess without bleeding: Secondary | ICD-10-CM | POA: Insufficient documentation

## 2022-06-13 DIAGNOSIS — Z6841 Body Mass Index (BMI) 40.0 and over, adult: Secondary | ICD-10-CM | POA: Insufficient documentation

## 2022-06-13 DIAGNOSIS — K219 Gastro-esophageal reflux disease without esophagitis: Secondary | ICD-10-CM | POA: Diagnosis not present

## 2022-06-13 DIAGNOSIS — I5032 Chronic diastolic (congestive) heart failure: Secondary | ICD-10-CM

## 2022-06-13 HISTORY — PX: POLYPECTOMY: SHX5525

## 2022-06-13 HISTORY — PX: BIOPSY: SHX5522

## 2022-06-13 HISTORY — PX: COLONOSCOPY WITH PROPOFOL: SHX5780

## 2022-06-13 LAB — HM COLONOSCOPY

## 2022-06-13 SURGERY — COLONOSCOPY WITH PROPOFOL
Anesthesia: General

## 2022-06-13 MED ORDER — PROPOFOL 500 MG/50ML IV EMUL
INTRAVENOUS | Status: DC | PRN
  Administered 2022-06-13: 150 ug/kg/min via INTRAVENOUS

## 2022-06-13 MED ORDER — LIDOCAINE HCL (CARDIAC) PF 100 MG/5ML IV SOSY
PREFILLED_SYRINGE | INTRAVENOUS | Status: DC | PRN
  Administered 2022-06-13: 50 mg via INTRAVENOUS

## 2022-06-13 MED ORDER — PROPOFOL 10 MG/ML IV BOLUS
INTRAVENOUS | Status: DC | PRN
  Administered 2022-06-13: 50 mg via INTRAVENOUS
  Administered 2022-06-13: 100 mg via INTRAVENOUS

## 2022-06-13 MED ORDER — LACTATED RINGERS IV SOLN: INTRAVENOUS | Status: DC

## 2022-06-13 NOTE — H&P (Signed)
Amanda Mckay is an 77 y.o. female.   Chief Complaint: CRC screening HPI: Amanda Mckay is a 77 y.o. female with past medical history of arthritis, a fib, CHF, GERD, hemochromatosis, who comes for positive Cologuard.  Last colonoscopy was close to 15 years ago no report is available.  The patient denies having any complaints such as melena, hematochezia, abdominal pain or distention, change in her bowel movement consistency or frequency, no changes in weight recently.  No family history of colorectal cancer.   Past Medical History:  Diagnosis Date   Arthritis    Atrial fibrillation ([place])    Cataract    Congestive heart failure (HCC)    Dysrhythmia    AFib   GERD (gastroesophageal reflux disease)    Hemochromatosis    Iron excess    Spondylosis of lumbar spine     Past Surgical History:  Procedure Laterality Date   CESAREAN SECTION     KNEE ARTHROSCOPY WITH MEDIAL MENISECTOMY Right 01/28/2019   Procedure: RIGHT KNEE ARTHROSCOPY WITH MEDIAL MENISCECTOMY;  Surgeon: Carole Civil, MD;  Location: [facility];  Service: Orthopedics;  Laterality: Right;   TONSILLECTOMY     VENTRAL HERNIA REPAIR N/A 01/26/2018   Procedure: HERNIA REPAIR VENTRAL ADULT WITH MESH;  Surgeon: Virl Cagey, MD;  Location: [facility];  Service: General;  Laterality: N/A;    Family History  Problem Relation Age of Onset   Stroke Mother    Atrial fibrillation Son    Stroke Maternal Grandmother    Diabetes Paternal Grandfather    Arthritis Brother    Social History:  reports that she has never smoked. She has been exposed to tobacco smoke. She has never used smokeless tobacco. She reports current alcohol use of about 2.0 standard drinks of alcohol per week. She reports that she does not use drugs.  Allergies:  Allergies  Allergen Reactions   Tetanus Toxoids Other (See Comments)    Reaction occurred more than 40 yrs ago--does not remember what occurred.    Medications Prior to Admission   Medication Sig Dispense Refill   atorvastatin (LIPITOR) 10 MG tablet Take 1 tablet (10 mg total) by mouth daily. 90 tablet 3   Calcium Carb-Cholecalciferol (CALCIUM 600+D3 PO) Take 1 tablet by mouth 2 (two) times daily.     Cholecalciferol 25 MCG (1000 UT) capsule Take 1,000 Units by mouth daily.     clotrimazole (LOTRIMIN AF) 1 % cream Apply 1 application topically 2 (two) times daily. (Patient taking differently: Apply 1 application  topically 2 (two) times daily. Applied to toes twice daily for fungus) 30 g 1   diclofenac (CATAFLAM) 50 MG tablet TAKE ONE TABLET (50MG  TOTAL) BY MOUTH TWO TIMES DAILY 60 tablet 2   ELIQUIS 5 MG TABS tablet TAKE ONE TABLET BY MOUTH TWICE A DAY 180 tablet 3   flecainide (TAMBOCOR) 150 MG tablet TAKE 0.5 TABLETS (75 MG TOTAL) BY MOUTH 2 (TWO) TIMES DAILY 90 tablet 3   furosemide (LASIX) 20 MG tablet TAKE ONE (1) TABLET BY MOUTH EVERY DAY AS NEEDED 90 tablet 3   magnesium oxide (MAG-OX) 400 MG tablet Take 400 mg by mouth daily.     metoprolol tartrate (LOPRESSOR) 50 MG tablet Take 1 tablet (50 mg total) by mouth 2 (two) times daily. 180 tablet 3   omeprazole (PRILOSEC) 40 MG capsule Take 40 mg by mouth daily as needed (heartburn).     potassium chloride (KLOR-CON) 10 MEQ tablet TAKE ONE (1) TABLET BY  MOUTH EVERY DAY WITH LASIX FOR LEG CRAMPS (Patient taking differently: Take 10 mEq by mouth daily as needed (when taking Lasix).) 90 tablet 2   trolamine salicylate (ASPERCREME) 10 % cream Apply 1 application topically 2 (two) times daily as needed for muscle pain.      acetaminophen (TYLENOL) 650 MG CR tablet Take 650-1,300 mg by mouth every 8 (eight) hours as needed for pain.      No results found for this or any previous visit (from the past 48 hour(s)). No results found.  Review of Systems  All other systems reviewed and are negative.   Blood pressure 130/64, pulse 74, temperature 98.1 F (36.7 C), resp. rate 16, SpO2 97 %. Physical Exam  GENERAL: The  patient is AO x3, in no acute distress. HEENT: Head is normocephalic and atraumatic. EOMI are intact. Mouth is well hydrated and without lesions. NECK: Supple. No masses LUNGS: Clear to auscultation. No presence of rhonchi/wheezing/rales. Adequate chest expansion HEART: RRR, normal s1 and s2. ABDOMEN: Soft, nontender, no guarding, no peritoneal signs, and nondistended. BS +. No masses. EXTREMITIES: Without any cyanosis, clubbing, rash, lesions or edema. NEUROLOGIC: AOx3, no focal motor deficit. SKIN: no jaundice, no rashes  Assessment/Plan Amanda Mckay is a 77 y.o. female with past medical history of arthritis, a fib, CHF, GERD, hemochromatosis, who comes for positive Cologuard. Will proceed with colonoscopy  Harvel Quale, MD 06/13/2022, 9:32 AM

## 2022-06-13 NOTE — Op Note (Signed)
[facility] Patient Name: Amanda Mckay Procedure Date: 06/13/2022 9:20 AM MRN: [id number] Date of Birth: 10-Apr-1945 Attending MD: Maylon Peppers , , [id number] CSN: [id number] Age: 77 Admit Type: Outpatient Procedure:                Colonoscopy Indications:              Positive Cologuard test Providers:                Maylon Peppers Referring MD:              Medicines:                Monitored Anesthesia Care Complications:            No immediate complications. Estimated Blood Loss:     Estimated blood loss: none. Procedure:                Pre-Anesthesia Assessment:                           - Prior to the procedure, a History and Physical                            was performed, and patient medications, allergies                            and sensitivities were reviewed. The patient's                            tolerance of previous anesthesia was reviewed.                           - The risks and benefits of the procedure and the                            sedation options and risks were discussed with the                            patient. All questions were answered and informed                            consent was obtained.                           - ASA Grade Assessment: II - A patient with mild                            systemic disease.                           After obtaining informed consent, the colonoscope                            was passed under direct vision. Throughout the                            procedure, the patient's blood pressure, pulse, and  oxygen saturations were monitored continuously. The                            [id number] scope was introduced through                            the anus and advanced to the the cecum, identified                            by appendiceal orifice and ileocecal valve. The                            colonoscopy was performed without difficulty. The                             patient tolerated the procedure well. The quality                            of the bowel preparation was good. Scope In: 10:10:03 AM Scope Out: 10:37:43 AM Scope Withdrawal Time: 0 hours 24 minutes 28 seconds  Total Procedure Duration: 0 hours 27 minutes 40 seconds  Findings:      The perianal and digital rectal examinations were normal.      A 1 mm polyp was found in the cecum. The polyp was sessile. The polyp       was removed with a cold biopsy forceps. Resection and retrieval were       complete.      Six sessile polyps were found in the ascending colon and cecum. The       polyps were 3 to 6 mm in size. These polyps were removed with a cold       snare. Resection and retrieval were complete.      Multiple large-mouthed and small-mouthed diverticula were found in the       sigmoid colon.      Non-bleeding internal hemorrhoids were found during retroflexion. The       hemorrhoids were small. Impression:               - One 1 mm polyp in the cecum, removed with a cold                            biopsy forceps. Resected and retrieved.                           - Six 3 to 6 mm polyps in the ascending colon and                            in the cecum, removed with a cold snare. Resected                            and retrieved.                           - Diverticulosis in the sigmoid colon.                           -  Non-bleeding internal hemorrhoids. Moderate Sedation:      Per Anesthesia Care Recommendation:           - Discharge patient to home (ambulatory).                           - Resume previous diet.                           - Await pathology results.                           - Repeat colonoscopy for surveillance based on                            pathology results.                           - Resume Eliquis (apixaban) at prior dose today. Procedure Code(s):        --- Professional ---                           [id number], Colonoscopy, flexible; with removal of                             tumor(s), polyp(s), or other lesion(s) by snare                            technique                           45380, 20, Colonoscopy, flexible; with biopsy,                            single or multiple Diagnosis Code(s):        --- Professional ---                           D12.0, Benign neoplasm of cecum                           D12.2, Benign neoplasm of ascending colon                           K64.8, Other hemorrhoids                           R19.5, Other fecal abnormalities                           K57.30, Diverticulosis of large intestine without                            perforation or abscess without bleeding CPT copyright 2022 American Medical Association. All rights reserved. The codes documented in this report are preliminary and upon coder review may  be revised to meet current compliance requirements. Maylon Peppers, MD Maylon Peppers,  06/13/2022 10:45:45 AM This report has been signed electronically. Number of  Addenda: 0

## 2022-06-13 NOTE — Anesthesia Preprocedure Evaluation (Signed)
Anesthesia Evaluation  Patient identified by MRN, date of birth, ID band Patient awake    Reviewed: Allergy & Precautions, H&P , NPO status , Patient's Chart, lab work & pertinent test results, reviewed documented beta blocker date and time   Airway Mallampati: I  TM Distance: >3 FB Neck ROM: Full    Dental no notable dental hx. (+) Dental Advisory Given [place] :   Pulmonary neg pulmonary ROS   Pulmonary exam normal breath sounds clear to auscultation       Cardiovascular METS (poor because of back pain): hypertension, Pt. on medications and Pt. on home beta blockers +CHF  Normal cardiovascular exam+ dysrhythmias Atrial Fibrillation  Rhythm:Regular Rate:Normal   1. Left ventricular ejection fraction, by estimation, is 65 to 70%. The  left ventricle has normal function. The left ventricle has no regional  wall motion abnormalities. There is mild concentric left ventricular  hypertrophy. Left ventricular diastolic  parameters are consistent with Grade I diastolic dysfunction (impaired  relaxation).   2. Right ventricular systolic function is normal. The right ventricular  size is normal. There is normal pulmonary artery systolic pressure.   3. The mitral valve is normal in structure. Trivial mitral valve  regurgitation. No evidence of mitral stenosis.   4. The aortic valve is normal in structure. Aortic valve regurgitation is  not visualized. No aortic stenosis is present.   5. The inferior vena cava is normal in size with greater than 50%  respiratory variability, suggesting right atrial pressure of 3 mmHg.  11-Jun-2022 11:44:44 [facility]-AP-300 ROUTINE RECORD 04-02-1945 (72 yr) Female Caucasian Vent. 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-L5 level   Abdominal   Peds negative pediatric ROS (+)  Hematology  (+) Blood dyscrasia (Hemochromatosis)   Anesthesia Other Findings   Reproductive/Obstetrics negative OB ROS                             Anesthesia Physical Anesthesia Plan  ASA: 3  Anesthesia Plan: General   Post-op Pain Management: Minimal or no pain anticipated   Induction: Intravenous  PONV Risk Score and Plan: 1  Airway Management Planned: Nasal Cannula and Natural Airway  Additional Equipment:   Intra-op Plan:   Post-operative Plan:   Informed Consent: I have reviewed the patients History and Physical, chart, labs and discussed the procedure including the risks, benefits and alternatives for the proposed anesthesia with the patient or authorized representative who has indicated his/her understanding and acceptance.     Dental advisory given  Plan Discussed with: CRNA and Surgeon  Anesthesia Plan Comments:         Anesthesia Quick Evaluation

## 2022-06-13 NOTE — Anesthesia Postprocedure Evaluation (Signed)
Anesthesia Post Note  Patient: Amanda Mckay  Procedure(s) Performed: COLONOSCOPY WITH PROPOFOL POLYPECTOMY BIOPSY  Patient location during evaluation: Phase II Anesthesia Type: General Level of consciousness: awake and alert and oriented Pain management: pain level controlled Vital Signs Assessment: post-procedure vital signs reviewed and stable Respiratory status: spontaneous breathing, nonlabored ventilation and respiratory function stable Cardiovascular status: blood pressure returned to baseline and stable Postop Assessment: no apparent nausea or vomiting Anesthetic complications: no  No notable events documented.   Last Vitals:  Vitals:   06/13/22 0914 06/13/22 1040  BP: 130/64 108/76  Pulse: 74 70  Resp: 16 14  Temp: 36.7 C 36.9 C  SpO2: 97% 99%    Last Pain:  Vitals:   06/13/22 1040  TempSrc: Oral  PainSc: 0-No pain                 Cindy Brindisi C Martyn Timme

## 2022-06-13 NOTE — Discharge Instructions (Signed)
You are being discharged to home.  Resume your previous diet.  We are waiting for your pathology results.  Your physician has recommended a repeat colonoscopy for surveillance based on pathology results.  Resume taking Eliquis (apixaban) at your prior dose today.

## 2022-06-13 NOTE — Anesthesia Procedure Notes (Signed)
Date/Time: 06/13/2022 10:08 AM  Performed by: Orlie Dakin, CRNAPre-anesthesia Checklist: Patient identified, Emergency Drugs available, Suction available and Patient being monitored Patient Re-evaluated:Patient Re-evaluated prior to induction Oxygen Delivery Method: Nasal cannula Induction Type: IV induction Placement Confirmation: positive ETCO2

## 2022-06-13 NOTE — Transfer of Care (Signed)
Immediate Anesthesia Transfer of Care Note  Patient: Amanda Mckay  Procedure(s) Performed: COLONOSCOPY WITH PROPOFOL POLYPECTOMY BIOPSY  Patient Location: Short Stay  Anesthesia Type:General  Level of Consciousness: drowsy  Airway & Oxygen Therapy: Patient Spontanous Breathing  Post-op Assessment: Report given to RN and Post -op Vital signs reviewed and stable  Post vital signs: Reviewed and stable  Last Vitals:  Vitals Value Taken Time  BP    Temp    Pulse    Resp    SpO2      Last Pain:  Vitals:   06/13/22 1004  PainSc: 0-No pain         Complications: No notable events documented.

## 2022-06-14 LAB — SURGICAL PATHOLOGY

## 2022-06-24 ENCOUNTER — Encounter (HOSPITAL_COMMUNITY): Payer: Self-pay | Admitting: Gastroenterology

## 2022-07-18 ENCOUNTER — Ambulatory Visit: Payer: Medicare PPO | Admitting: Podiatry

## 2022-07-24 ENCOUNTER — Other Ambulatory Visit: Payer: Self-pay | Admitting: Student

## 2022-07-29 ENCOUNTER — Other Ambulatory Visit: Payer: Self-pay | Admitting: Student

## 2022-07-29 NOTE — Telephone Encounter (Signed)
Prescription refill request for Eliquis received. Indication: PAF Last office visit: 09/12/21  B Strader PA-C Scr: 0.77 on 03/25/22  Epic Age: 77 Weight: 114/3kg  Based on above findings Eliquis 5mg  twice daily is the appropriate dose.  Refill approved.

## 2022-08-01 ENCOUNTER — Ambulatory Visit: Payer: Medicare PPO | Admitting: Podiatry

## 2022-08-01 DIAGNOSIS — M79674 Pain in right toe(s): Secondary | ICD-10-CM | POA: Diagnosis not present

## 2022-08-01 DIAGNOSIS — M79675 Pain in left toe(s): Secondary | ICD-10-CM

## 2022-08-01 DIAGNOSIS — B351 Tinea unguium: Secondary | ICD-10-CM

## 2022-08-01 NOTE — Progress Notes (Signed)
This patient returns to the office for evaluation and treatment of long thick painful nails .  This patient is unable to trim her own nails since the patient cannot reach her feet.  Patient says the nails are painful walking and wearing her shoes.  Sh e returns for preventive foot care services.  General Appearance  Alert, conversant and in no acute stress.  Vascular  Dorsalis pedis and posterior tibial  pulses are not  palpable due to swelling legs/feet.  bilaterally.  Capillary return is within normal limits  Bilaterally.Cold feet  Bilaterally.  Absent digital hair  B/L.  Neurologic  Senn-Weinstein monofilament wire test within normal limits  bilaterally. Muscle power within normal limits bilaterally.  Nails Thick disfigured discolored nails with subungual debris  from hallux to fifth toes bilaterally. No evidence of bacterial infection or drainage bilaterally. Pincer hallux nails  B/L.  Orthopedic  No limitations of motion  feet .  No crepitus or effusions noted. Hammer toes  B/l  Skin  normotropic skin with no porokeratosis noted bilaterally.  No signs of infections or ulcers noted.     Onychomycosis  Pain in toes right foot  Pain in toes left foot  Debridement  of nails  1-5  B/L with a nail nipper.  Nails were then filed using a dremel tool with no incidents.    RTC 10 weeks    Ferlando Lia D.P.M. 

## 2022-08-27 ENCOUNTER — Other Ambulatory Visit: Payer: Self-pay | Admitting: Nurse Practitioner

## 2022-08-28 NOTE — Telephone Encounter (Signed)
Requested medication (s) are due for refill today: yes  Requested medication (s) are on the active medication list: yes  Last refill:  04/19/21  Future visit scheduled: yes  Notes to clinic:  Unable to refill per protocol, last refill by another provider no longer at this practice. Last OV 04/09/22, within protocol.     Requested Prescriptions  Pending Prescriptions Disp Refills   furosemide (LASIX) 20 MG tablet [Pharmacy Med Name: FUROSEMIDE 20 MG TAB] 90 tablet 3    Sig: TAKE ONE (1) TABLET BY MOUTH EVERY DAY AS NEEDED     Cardiovascular:  Diuretics - Loop Failed - 08/27/2022 10:31 AM      Failed - Mg Level in normal range and within 180 days    Magnesium  Date Value Ref Range Status  05/22/2020 1.9 1.5 - 2.5 mg/dL Final         Failed - Valid encounter within last 6 months    Recent Outpatient Visits           1 year ago Essential hypertension   [facility] Valentino Nose, NP   1 year ago Essential hypertension   [facility] Valentino Nose, NP   1 year ago Acute cystitis with hematuria   [facility] Valentino Nose, NP   1 year ago Chronic diastolic HF (heart failure) (HCC)   [facility] Valentino Nose, NP   2 years ago Atrial fibrillation, chronic   [facility] [place], Velna Hatchet, MD       Future Appointments             In 2 weeks Iran Ouch, Lennart Pall, PA-C [facility] [place] [facility]   In 7 months [place] Aguas, Binnie Rail, FNP [place] [facility]            Passed - K in normal range and within 180 days    Potassium  Date Value Ref Range Status  03/25/2022 4.5 3.5 - 5.3 mmol/L Final         Passed - Ca in normal range and within 180 days    Calcium  Date Value Ref Range Status  03/25/2022 9.3 8.6 - 10.4 mg/dL Final   Calcium, Ion  Date Value Ref Range Status  06/07/2020 1.19 1.15 - 1.40 mmol/L Final          Passed - Na in normal range and within 180 days    Sodium  Date Value Ref Range Status  03/25/2022 141 135 - 146 mmol/L Final         Passed - Cr in normal range and within 180 days    Creat  Date Value Ref Range Status  03/25/2022 0.77 0.60 - 1.00 mg/dL Final         Passed - Cl in normal range and within 180 days    Chloride  Date Value Ref Range Status  03/25/2022 107 98 - 110 mmol/L Final         Passed - Last BP in normal range    BP Readings from Last 1 Encounters:  06/13/22 108/76          potassium chloride (KLOR-CON) 10 MEQ tablet [Pharmacy Med Name: POTASSIUM CHLORIDE ER 10 MEQ TAB] 90 tablet 2    Sig: TAKE ONE (1) TABLET BY MOUTH EVERY DAY WITH LASIX FOR LEG CRAMPS     Endocrinology:  Minerals -  Potassium Supplementation Failed - 08/27/2022 10:31 AM      Failed - Valid encounter within last 12 months    Recent Outpatient Visits           1 year ago Essential hypertension   [facility] Valentino Nose, NP   1 year ago Essential hypertension   [facility] Valentino Nose, NP   1 year ago Acute cystitis with hematuria   [facility] Valentino Nose, NP   1 year ago Chronic diastolic HF (heart failure) (HCC)   [facility] Valentino Nose, NP   2 years ago Atrial fibrillation, chronic   [facility] [place], Velna Hatchet, MD       Future Appointments             In 2 weeks Iran Ouch, Lennart Pall, PA-C [facility] [place] [facility]   In 7 months Park Meo, FNP [place] [facility]            Passed - K in normal range and within 360 days    Potassium  Date Value Ref Range Status  03/25/2022 4.5 3.5 - 5.3 mmol/L Final         Passed - Cr in normal range and within 360 days    Creat  Date Value Ref Range Status  03/25/2022 0.77 0.60 - 1.00 mg/dL Final

## 2022-08-30 ENCOUNTER — Encounter (HOSPITAL_COMMUNITY): Payer: Self-pay | Admitting: Gastroenterology

## 2022-09-05 ENCOUNTER — Other Ambulatory Visit: Payer: Self-pay | Admitting: Orthopedic Surgery

## 2022-09-05 ENCOUNTER — Telehealth: Payer: Self-pay | Admitting: Internal Medicine

## 2022-09-05 NOTE — Telephone Encounter (Signed)
Spoke to patient who stated that she restarted fluid pill today at 2 pm but had developed a itchy rash prior to restarting medication. Pt stated she had stopped the medication d/t having her grandchildren with her. Pt denies SOB, CP. Pt stated that she went to [facility] for an evaluation but was told that with her cardiac hx, the provider there was uncomfortable making any adjustments to her medications and needed to see Cardiology. Pt has appt with B. Strader, PA-C on 6/26, but was questioning if she needed to be seen sooner (no earlier appts available in [place]).   Please advise.

## 2022-09-05 NOTE — Telephone Encounter (Signed)
Pt c/o swelling: STAT is pt has developed SOB within 24 hours  How much weight have you gained and in what time span? N/A   If swelling, where is the swelling located? From ankles up to knees   Are you currently taking a fluid pill? Yes, but stopped taking for 3 days   Are you currently SOB? No   Do you have a log of your daily weights (if so, list)? N/A   Have you gained 3 pounds in a day or 5 pounds in a week? No   Have you traveled recently? No   Patient is calling stating that she stopped taking her fluid pill for 3 days due to having her grandchildren and not wanting to run to the bathroom too much and she developed a rash. States she went to [place] and was told this seemed heart related and she would like to know if she should be seen sooner than what is scheduled already.

## 2022-09-05 NOTE — Telephone Encounter (Signed)
   It would be odd for a rash to be cardiac related unless it was related to starting a new medication. If her rash started prior to starting her fluid pill, I doubt it is related to this and she has been on Lasix for quite some time. Would recommend ruling out other causes of her rash such as new detergents, lotions or outdoor exposures. Would avoid PO Benadryl given the use of Flecainide but could use OTC ointment or cream if needed.   Signed, Ellsworth Lennox, PA-C 09/05/2022, 4:51 PM

## 2022-09-05 NOTE — Telephone Encounter (Signed)
Patient notified and will use an OTC cream/ointment for rash treatment. Pt stated that she had spent some time at a community pool and noticed the rash the next day. Pt stated when she looked online she compared her rash to pictures of chlorine rash and they looked the same. Pt had no further questions or concerns at this time.

## 2022-09-11 ENCOUNTER — Ambulatory Visit: Payer: Medicare PPO | Attending: Student | Admitting: Student

## 2022-09-11 ENCOUNTER — Encounter: Payer: Self-pay | Admitting: Student

## 2022-09-11 VITALS — BP 116/68 | HR 78 | Ht 66.0 in | Wt 250.0 lb

## 2022-09-11 DIAGNOSIS — I480 Paroxysmal atrial fibrillation: Secondary | ICD-10-CM | POA: Diagnosis not present

## 2022-09-11 DIAGNOSIS — R600 Localized edema: Secondary | ICD-10-CM

## 2022-09-11 DIAGNOSIS — Z5181 Encounter for therapeutic drug level monitoring: Secondary | ICD-10-CM

## 2022-09-11 DIAGNOSIS — K219 Gastro-esophageal reflux disease without esophagitis: Secondary | ICD-10-CM

## 2022-09-11 DIAGNOSIS — E782 Mixed hyperlipidemia: Secondary | ICD-10-CM

## 2022-09-11 DIAGNOSIS — Z79899 Other long term (current) drug therapy: Secondary | ICD-10-CM

## 2022-09-11 NOTE — Patient Instructions (Signed)
Medication Instructions:  Your physician recommends that you continue on your current medications as directed. Please refer to the Current Medication list given to you today.   Labwork: Flecainide level TOMORROW before you take your Flecainide  Testing/Procedures: None today  Follow-Up: 1 year  Any Other Special Instructions Will Be Listed Below (If Applicable).  If you need a refill on your cardiac medications before your next appointment, please call your pharmacy.

## 2022-09-11 NOTE — Progress Notes (Signed)
Cardiology Office Note    Date:  09/11/2022  ID:  Amanda Mckay 01-08-1946, MRN [id number] Cardiologist: Dietrich Pates, MD    History of Present Illness:    Amanda Mckay is a 77 y.o. female with past medical history of paroxysmal atrial fibrillation, HLD, GERD and hemochromatosis who presents to the office today for annual follow-up.  She was examined by myself in 08/2021 and denied any recent palpitations or anginal symptoms at that time. She did report brief hematuria in the setting of nephrolithiasis but this had resolved. She was continued on Flecainide 75 mg twice daily and Lopressor 50 mg twice daily and it was recommended to check a Flecainide level in the morning hours before her AM dose. It does not appear this was obtained in the interim.  In talking with the patient today, she reports overall doing well since her last office visit. She denies any recent chest pain or palpitations. No specific orthopnea, PND or dyspnea on exertion. She did experience worsening lower extremity edema last week and also had a rash at that time. It is unclear if the rash was due to chlorine exposure as her grandchildren were visiting and she went to a community pool. Says she was also holding her Lasix at that time to avoid frequent urination. She has since been taking Lasix 20 mg daily and reports significant improvement in her edema. She remains on Eliquis for anticoagulation with no reports of active bleeding.  Studies Reviewed:   EKG: EKG is not ordered today. EKG from 06/11/2022 is reviewed and demonstrates NSR, HR 64 with 1st degree AV Block. No acute ST changes.   Echocardiogram: 05/2020 IMPRESSIONS     1. Left ventricular ejection fraction, by estimation, is 65 to 70%. The  left ventricle has normal function. The left ventricle has no regional  wall motion abnormalities. There is mild concentric left ventricular  hypertrophy. Left ventricular diastolic  parameters are  consistent with Grade I diastolic dysfunction (impaired  relaxation).   2. Right ventricular systolic function is normal. The right ventricular  size is normal. There is normal pulmonary artery systolic pressure.   3. The mitral valve is normal in structure. Trivial mitral valve  regurgitation. No evidence of mitral stenosis.   4. The aortic valve is normal in structure. Aortic valve regurgitation is  not visualized. No aortic stenosis is present.   5. The inferior vena cava is normal in size with greater than 50%  respiratory variability, suggesting right atrial pressure of 3 mmHg.    Risk Assessment/Calculations:    CHA2DS2-VASc Score = 5   This indicates a 7.2% annual risk of stroke. The patient's score is based upon: CHF History: 1 HTN History: 1 Diabetes History: 0 Stroke History: 0 Vascular Disease History: 0 Age Score: 2 Gender Score: 1   Physical Exam:   VS:  BP 116/68   Pulse 78   Ht 5\' 6"  (1.676 m)   Wt 250 lb (113.4 kg)   SpO2 97%   BMI 40.35 kg/m    Wt Readings from Last 3 Encounters:  09/11/22 250 lb (113.4 kg)  06/11/22 251 lb 12.3 oz (114.2 kg)  05/02/22 251 lb 12.8 oz (114.2 kg)     GEN: Pleasant, female appearing in no acute distress. NECK: No JVD; No carotid bruits CARDIAC: RRR, no murmurs, rubs, gallops RESPIRATORY:  Clear to auscultation without rales, wheezing or rhonchi  ABDOMEN: Appears non-distended. No obvious abdominal masses. EXTREMITIES: No clubbing or cyanosis. 1+  pitting edema up to mid-shins bilaterally.  Distal pedal pulses are 2+ bilaterally.   Assessment and Plan:   1. Paroxysmal Atrial Fibrillation - She denies any recent palpitations and is maintaining normal sinus rhythm by examination today. Continue Flecainide 75 mg twice daily and Lopressor 50 mg twice daily. Will recheck a Flecainide level as previously recommended since she has been unable to walk on a treadmill. - No reports of active bleeding. Continue Eliquis 5 mg  twice daily for anticoagulation. This is the appropriate dose currently given her age, weight and renal function. CBC in 03/2022 showed her hemoglobin was stable at 14.9 with platelets at 191 K.   2. Lower Extremity Edema - She does have 1+ edema on examination today but reports symptoms have significantly improved since resuming her diuretic last week. I encouraged her to continue taking Lasix 20 mg daily along with potassium supplementation until her edema returns to baseline. She can then resume using this as needed..  3. HLD - Followed by her PCP. LDL was elevated to 113 when checked in 03/2022 and Atorvastatin was titrated to 10 mg daily at that time. She reports overall tolerating this well and is scheduled for follow-up labs for reassessment.  4. GERD - She reports stopping Omeprazole in the interim as she felt like this was actually making her reflux worse. Reports symptoms have improved since this was discontinued.   Disposition: She prefers to follow-up with Cardiology on an annual basis unless clinically indicated in the interim.   Signed, Ellsworth Lennox, PA-C

## 2022-10-24 IMAGING — MR MR HEAD WO/W CM
6 of 12 series · 26 of 48 positions shown · IV contrast (gadavist)
Comparison: Head CT from yesterday

CLINICAL DATA: Facial asymmetry with acute stroke suspected.
Concern for Bell's palsy on the left

EXAM:
MRI HEAD WITHOUT AND WITH CONTRAST
TECHNIQUE: Multiplanar, multiecho pulse sequences of the brain and surrounding
structures were obtained without and with intravenous contrast.
CONTRAST:  10mL GADAVIST GADOBUTROL 1 MMOL/ML IV SOLN

[Series 3: DWI · axial · 3.0mm · 0.94mm/px · z∈[-123,+22]mm · 9 of 100 slices shown (1 of 2)]
[im 1/100]
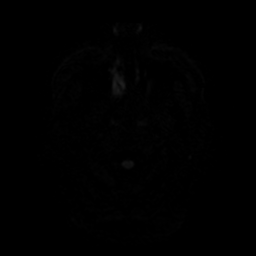
[im 13/100]
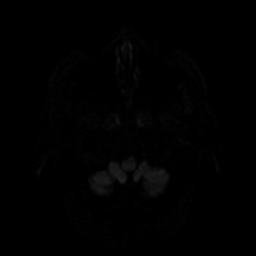
[im 25/100]
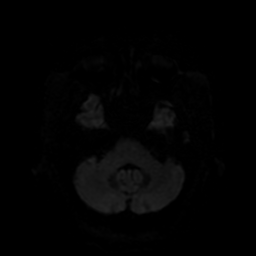
[im 38/100]
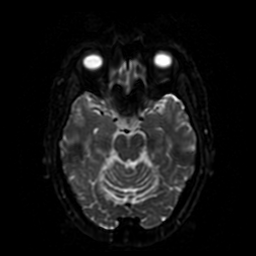
[im 50/100]
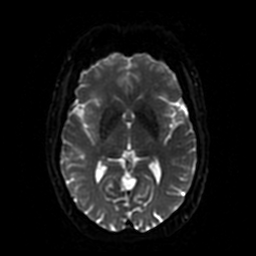
[im 62/100]
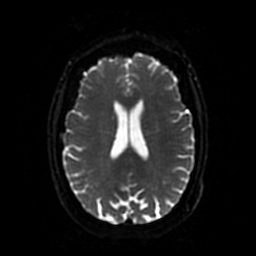
[im 75/100]
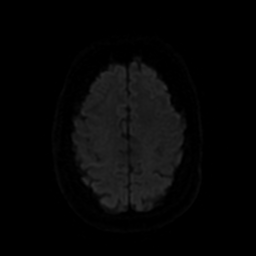
[im 87/100]
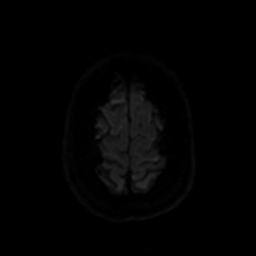
[im 100/100]
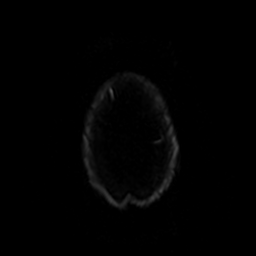

[Series 4: DWI · coronal · 4.0mm · 0.94mm/px · 6 of 70 slices shown (2 of 2)]
[im 1/70]
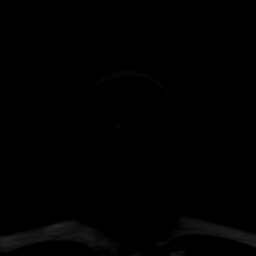
[im 14/70]
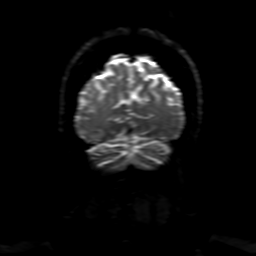
[im 28/70]
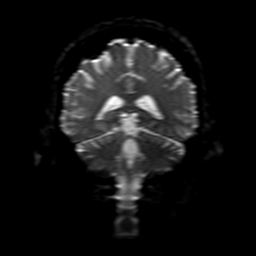
[im 42/70]
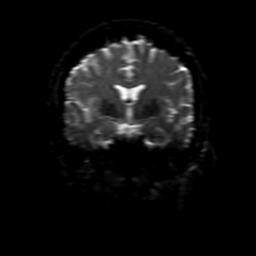
[im 56/70]
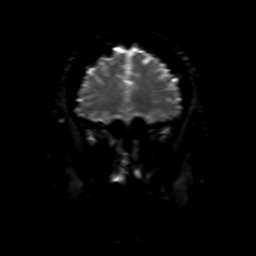
[im 70/70]
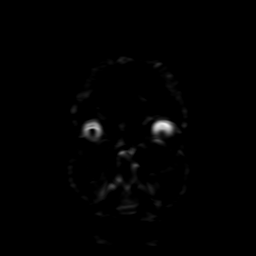

[Series 5: FLAIR · sagittal · 5.0mm · 0.23mm/px · 2 of 22 slices shown (1 of 2)]
[im 1/22]
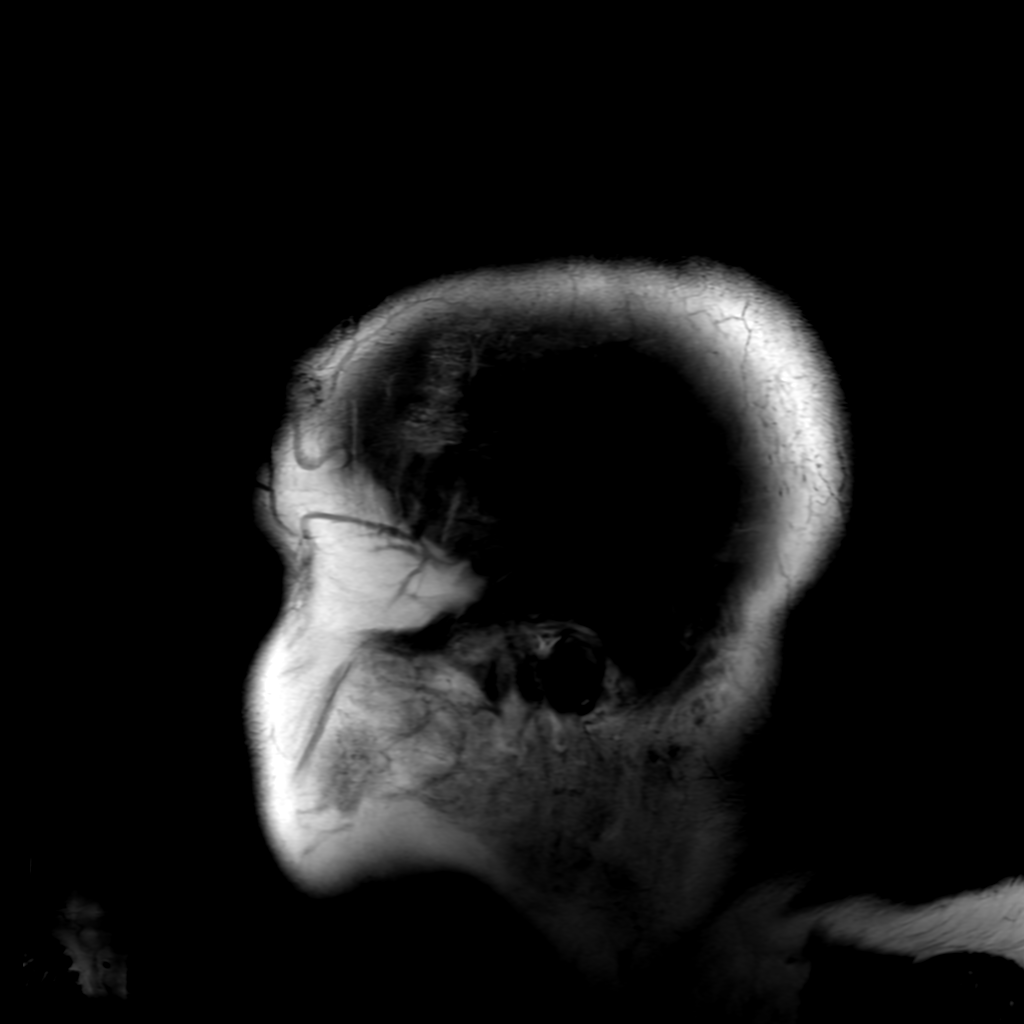
[im 22/22]
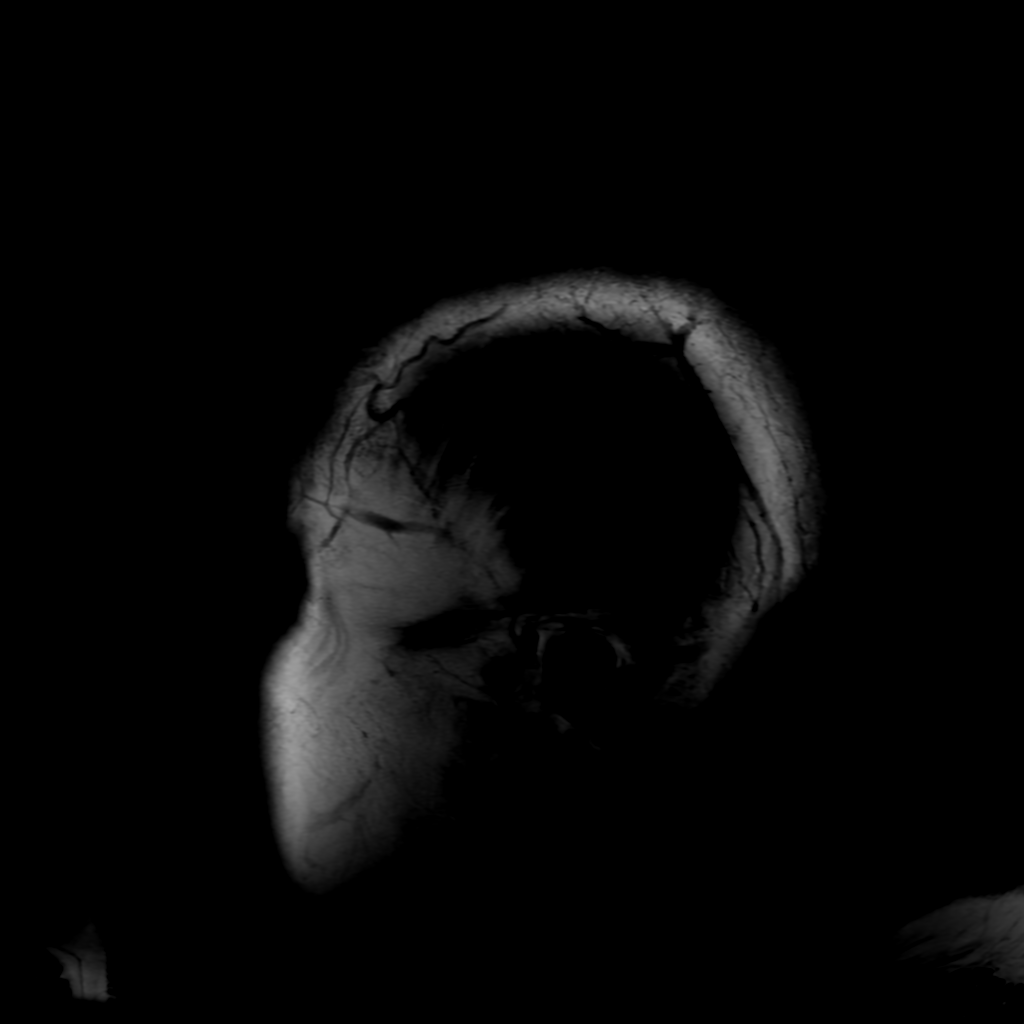

[Series 7: FLAIR · axial · 3.0mm · 0.45mm/px · z∈[-123,+20]mm · 2 of 19 slices shown (2 of 2)]
[im 1/19]
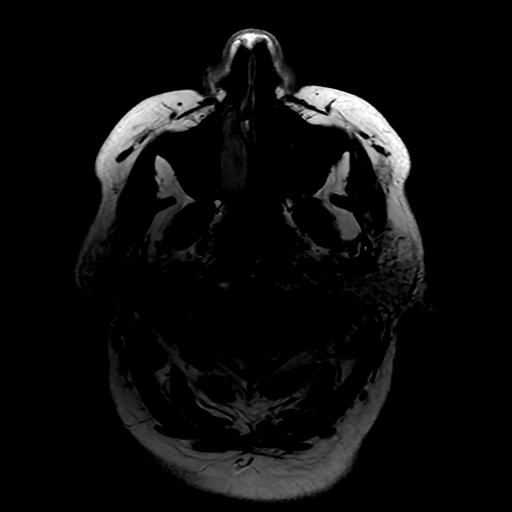
[im 19/19]
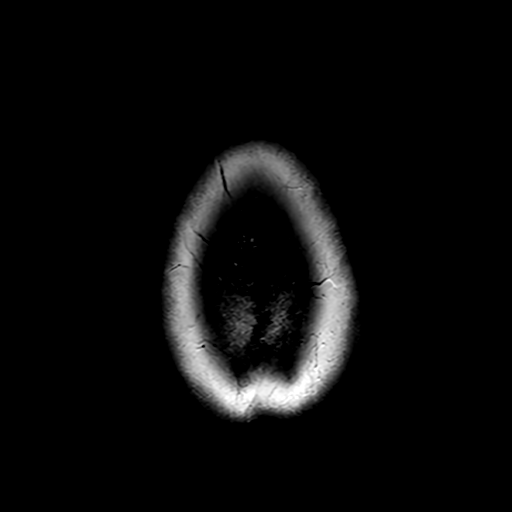

[Series 350: ADC · axial · 3.0mm · 0.94mm/px · z∈[-123,+22]mm · 4 of 50 slices shown (1 of 2)]
[im 1/50]
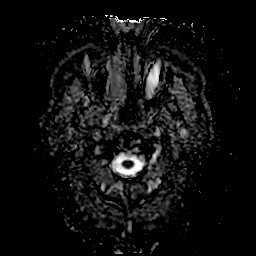
[im 17/50]
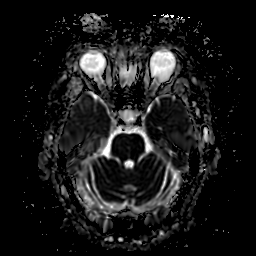
[im 33/50]
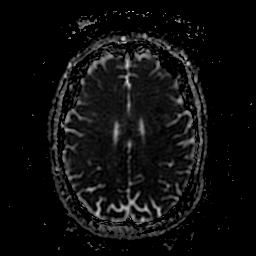
[im 50/50]
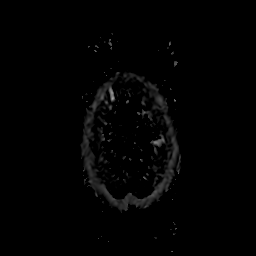

[Series 450: ADC · coronal · 4.0mm · 0.94mm/px · 3 of 35 slices shown (2 of 2)]
[im 1/35]
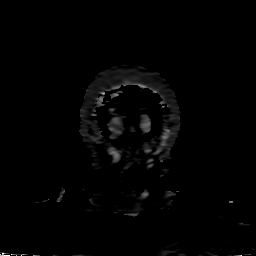
[im 18/35]
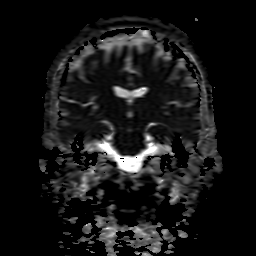
[im 35/35]
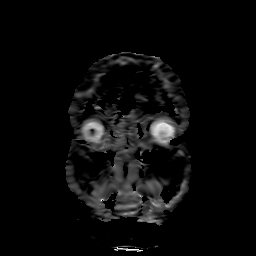

[26 of 48 positions shown; findings below may reference images not displayed]

FINDINGS: Brain: Punctate rounded focus of enhancement at the fundus of the
left internal auditory canal, subtle but possible fundal tuft sign
in the setting of suspected Bell's palsy on the left. No abnormal
enhancement at the level of the temporal bone or stylomastoid fat
pads.

No infarct, hemorrhage, hydrocephalus, or masslike finding. Normal
brain volume and white matter appearance.

Vascular: Normal flow voids.

Skull and upper cervical spine: Normal marrow signal.

Sinuses/Orbits: Retention cysts in the maxillary sinuses. Negative
orbits.
IMPRESSION: 1. Negative for infarct or mass. Normal appearance of the brain
itself.
2. Subtle findings at the left internal auditory canal described
with Bell's palsy.

## 2022-11-01 ENCOUNTER — Ambulatory Visit: Payer: Medicare PPO | Admitting: Podiatry

## 2022-11-04 ENCOUNTER — Ambulatory Visit (INDEPENDENT_AMBULATORY_CARE_PROVIDER_SITE_OTHER): Payer: Medicare PPO | Admitting: Podiatry

## 2022-11-04 ENCOUNTER — Encounter: Payer: Self-pay | Admitting: Podiatry

## 2022-11-04 DIAGNOSIS — M79674 Pain in right toe(s): Secondary | ICD-10-CM

## 2022-11-04 DIAGNOSIS — B351 Tinea unguium: Secondary | ICD-10-CM | POA: Diagnosis not present

## 2022-11-04 DIAGNOSIS — M79675 Pain in left toe(s): Secondary | ICD-10-CM | POA: Diagnosis not present

## 2022-11-09 NOTE — Progress Notes (Signed)
  Subjective:  Patient ID: Amanda Mckay, female    DOB: 28-Nov-1945,  MRN: [id number]  Amanda Mckay presents to clinic today for at risk foot care. Patient has history of CHF, chronic LE edema and at risk due to use of blood thinners and painful thick toenails that are difficult to trim. Pain interferes with ambulation. Aggravating factors include wearing enclosed shoe gear. Pain is relieved with periodic professional debridement.  Chief Complaint  Patient presents with   Nail Problem    RFC,Referring Provider Park Meo, FNP,lov:01/24      New problem(s): None.   PCP is Park Meo, FNP.  Allergies  Allergen Reactions   Tetanus Toxoids Other (See Comments)    Reaction occurred more than 40 yrs ago--does not remember what occurred.   Review of Systems: Negative except as noted in the HPI.  Objective: No changes noted in today's physical examination. There were no vitals filed for this visit. Amanda Mckay is a pleasant 77 y.o. female obese in NAD. AAO x 3.  Vascular Examination: CFT <4 seconds b/l. DP/PT pulses nonpalpable due to LE edema b/l. Digital hair absent. Skin temperature gradient warm to cool b/l. No ischemia or gangrene. No cyanosis or clubbing noted b/l.    Neurological Examination: Sensation grossly intact b/l with 10 gram monofilament. Vibratory sensation intact b/l.   Dermatological Examination: Pedal skin thin, shiny and atrophic b/l. No open wounds. No interdigital macerations.   Toenails 2-5 b/l thick, discolored, elongated with subungual debris and pain on dorsal palpation.   Pincer nail deformity both borders of left hallux and both borders of right hallux. No erythema, no edema, no drainage, no fluctuance. Nail border hypertrophy minimal.  Sign(s) of infection: no clinical signs of infection noted on examination today.. No corns, calluses nor porokeratotic lesions noted.  Musculoskeletal Examination: Normal muscle strength  5/5 to all lower extremity muscle groups bilaterally. Hammertoe deformity noted 2-5 b/l.[name] No pain, crepitus or joint limitation noted with ROM b/l LE.  Patient ambulates independently without assistive aids.  Radiographs: None  Assessment/Plan: 1. Pain due to onychomycosis of toenails of both feet   -Consent given for treatment as described below: -Examined patient. -Patient to continue soft, supportive shoe gear daily. -Toenails were debrided in length and girth 2-5 bilaterally with sterile nail nippers and dremel without iatrogenic bleeding.  -No invasive procedure(s) performed. Offending nail border debrided and curretaged both borders of left hallux and both borders of right hallux utilizing sterile nail nipper and currette. Border(s) cleansed with alcohol and triple antibiotic ointment applied. Patient/POA/Caregiver/Facility instructed to apply Neosporin Cream  to bilateral great toes once daily for 7 days. Call office if there are any concerns. -Patient/POA to call should there be question/concern in the interim.   Return in about 10 weeks (around 01/13/2023).  Freddie Breech, DPM

## 2022-11-13 ENCOUNTER — Other Ambulatory Visit: Payer: Self-pay | Admitting: Student

## 2022-12-06 ENCOUNTER — Other Ambulatory Visit: Payer: Self-pay | Admitting: Orthopedic Surgery

## 2022-12-10 ENCOUNTER — Ambulatory Visit: Payer: Medicare PPO

## 2022-12-10 VITALS — Wt 250.0 lb

## 2022-12-10 DIAGNOSIS — Z23 Encounter for immunization: Secondary | ICD-10-CM | POA: Diagnosis not present

## 2023-01-20 ENCOUNTER — Ambulatory Visit: Payer: Medicare PPO | Admitting: Podiatry

## 2023-01-20 DIAGNOSIS — M79674 Pain in right toe(s): Secondary | ICD-10-CM

## 2023-01-20 DIAGNOSIS — B351 Tinea unguium: Secondary | ICD-10-CM

## 2023-01-20 DIAGNOSIS — M79675 Pain in left toe(s): Secondary | ICD-10-CM

## 2023-01-21 ENCOUNTER — Other Ambulatory Visit: Payer: Self-pay | Admitting: Internal Medicine

## 2023-01-21 NOTE — Telephone Encounter (Signed)
Prescription refill request for Eliquis received. Indication: PAF Last office visit: 09/11/22  B Strader PA-C Scr: 0.77 on 03/26/19  Epic Age: 77 Weight: 113.4kg  Based on above findings Eliquis 5mg  twice daily is the appropriate dose.  Refill approved.

## 2023-01-23 ENCOUNTER — Encounter: Payer: Self-pay | Admitting: Podiatry

## 2023-01-23 NOTE — Progress Notes (Signed)
  Subjective:  Patient ID: Amanda Mckay, female    DOB: Dec 14, 1945,  MRN: [id number]  Amanda Mckay presents to clinic today for painful elongated mycotic toenails 1-5 bilaterally which are tender when wearing enclosed shoe gear. Pain is relieved with periodic professional debridement.   New problem(s): None.   PCP is Park Meo, FNP.  Allergies  Allergen Reactions   Tetanus Toxoids Other (See Comments)    Reaction occurred more than 40 yrs ago--does not remember what occurred.    Review of Systems: Negative except as noted in the HPI.  Objective: No changes noted in today's physical examination. There were no vitals filed for this visit. Amanda Mckay is a pleasant 77 y.o. female WD, WN in NAD. AAO x 3.  Vascular Examination: CFT <4 seconds b/l. DP pulses diminished b/l. PT pulses diminished b/l. Digital hair absent. Skin temperature gradient warm to cool b/l. No ischemia or gangrene. No cyanosis or clubbing noted b/l.    Neurological Examination: Sensation grossly intact b/l with 10 gram monofilament. Vibratory sensation intact b/l.   Dermatological Examination: Pedal skin thin, shiny and atrophic b/l. No open wounds. No interdigital macerations.   Toenails 2-5 b/l thick, discolored, elongated with subungual debris and pain on dorsal palpation.   Pincer nail deformity bilateral great toes. No erythema, no edema, no drainage, no fluctuance. Nail border hypertrophy absent.  Sign(s) of infection: no clinical signs of infection noted on examination today..  Musculoskeletal Examination: Muscle strength 5/5 to all lower extremity muscle groups bilaterally. Hammertoe(s) noted to the 2-5 bilaterally.  Radiographs: None  Assessment/Plan: 1. Pain due to onychomycosis of toenails of both feet     -Patient was evaluated today. All questions/concerns addressed on today's visit. -No new findings. No new orders. -Toenails 1-5 b/l were debrided in length and  girth with sterile nail nippers and dremel without iatrogenic bleeding.  -Patient/POA to call should there be question/concern in the interim.   Return in about 10 weeks (around 03/31/2023).  Freddie Breech, DPM

## 2023-03-08 ENCOUNTER — Other Ambulatory Visit: Payer: Self-pay | Admitting: Orthopedic Surgery

## 2023-03-14 ENCOUNTER — Emergency Department (HOSPITAL_COMMUNITY)
Admission: EM | Admit: 2023-03-14 | Discharge: 2023-03-15 | Disposition: A | Discharge status: Home / Self Care | Payer: Medicare PPO | Attending: Emergency Medicine | Admitting: Emergency Medicine

## 2023-03-14 ENCOUNTER — Other Ambulatory Visit: Payer: Self-pay

## 2023-03-14 ENCOUNTER — Encounter (HOSPITAL_COMMUNITY): Payer: Self-pay

## 2023-03-14 DIAGNOSIS — Z79899 Other long term (current) drug therapy: Secondary | ICD-10-CM | POA: Diagnosis not present

## 2023-03-14 DIAGNOSIS — Z87891 Personal history of nicotine dependence: Secondary | ICD-10-CM | POA: Diagnosis not present

## 2023-03-14 DIAGNOSIS — L03116 Cellulitis of left lower limb: Secondary | ICD-10-CM | POA: Diagnosis present

## 2023-03-14 DIAGNOSIS — I509 Heart failure, unspecified: Secondary | ICD-10-CM | POA: Insufficient documentation

## 2023-03-14 DIAGNOSIS — I480 Paroxysmal atrial fibrillation: Secondary | ICD-10-CM | POA: Diagnosis not present

## 2023-03-14 LAB — CBC WITH DIFFERENTIAL/PLATELET
Abs Immature Granulocytes: 0.01 10*3/uL (ref 0.00–0.07)
Basophils Absolute: 0 10*3/uL (ref 0.0–0.1)
Basophils Relative: 1 %
Eosinophils Absolute: 0.2 10*3/uL (ref 0.0–0.5)
Eosinophils Relative: 2 %
HCT: 44.2 % (ref 36.0–46.0)
Hemoglobin: 14.9 g/dL (ref 12.0–15.0)
Immature Granulocytes: 0 %
Lymphocytes Relative: 16 %
Lymphs Abs: 1.2 10*3/uL (ref 0.7–4.0)
MCH: 33.2 pg (ref 26.0–34.0)
MCHC: 33.7 g/dL (ref 30.0–36.0)
MCV: 98.4 fL (ref 80.0–100.0)
Monocytes Absolute: 0.6 10*3/uL (ref 0.1–1.0)
Monocytes Relative: 9 %
Neutro Abs: 5.4 10*3/uL (ref 1.7–7.7)
Neutrophils Relative %: 72 %
Platelets: 162 10*3/uL (ref 150–400)
RBC: 4.49 MIL/uL (ref 3.87–5.11)
RDW: 12.1 % (ref 11.5–15.5)
WBC: 7.5 10*3/uL (ref 4.0–10.5)
nRBC: 0 % (ref 0.0–0.2)

## 2023-03-14 LAB — COMPREHENSIVE METABOLIC PANEL
ALT: 17 U/L (ref 0–44)
AST: 16 U/L (ref 15–41)
Albumin: 4 g/dL (ref 3.5–5.0)
Alkaline Phosphatase: 70 U/L (ref 38–126)
Anion gap: 10 (ref 5–15)
BUN: 28 mg/dL — ABNORMAL HIGH (ref 8–23)
CO2: 24 mmol/L (ref 22–32)
Calcium: 9.4 mg/dL (ref 8.9–10.3)
Chloride: 106 mmol/L (ref 98–111)
Creatinine, Ser: 1.01 mg/dL — ABNORMAL HIGH (ref 0.44–1.00)
GFR, Estimated: 57 mL/min — ABNORMAL LOW (ref >60)
Glucose, Bld: 106 mg/dL — ABNORMAL HIGH (ref 70–99)
Potassium: 4 mmol/L (ref 3.5–5.1)
Sodium: 140 mmol/L (ref 135–145)
Total Bilirubin: 0.6 mg/dL (ref <1.2)
Total Protein: 7.2 g/dL (ref 6.5–8.1)

## 2023-03-14 NOTE — ED Triage Notes (Signed)
Pt reports waking up this morning just not feeling well, fatigued more than usual, also noted swelling, itching and redness to LLE with hx of CHF and cellulitis. Leg has 1+ pitting edema and is slightly warm to touch.

## 2023-03-15 MED ORDER — CEPHALEXIN 500 MG PO CAPS: 500.0000 mg | ORAL_CAPSULE | Freq: Four times a day (QID) | ORAL | 0 refills | Status: DC

## 2023-03-15 MED ORDER — SODIUM CHLORIDE 0.9 % IV SOLN
2.0000 g | Freq: Once | INTRAVENOUS | Status: AC
  Administered 2023-03-15: 2 g via INTRAVENOUS
  Filled 2023-03-15: qty 20

## 2023-03-15 NOTE — Discharge Instructions (Addendum)
Begin taking Keflex as prescribed.  Turn to the emergency department for increased redness, high fevers, severe pain, or for other new and concerning symptoms.

## 2023-03-15 NOTE — ED Provider Notes (Signed)
[facility] Provider Note   CSN: [id number] Arrival date & time: 03/14/23  1924     History  Chief Complaint  Patient presents with   Fatigue   Leg Swelling    Amanda Mckay is a 77 y.o. female.  Patient is a 77 year old female with history of CHF, paroxysmal A-fib, hyperlipidemia.  Patient presenting today with complaints of left leg redness and burning.  She woke from sleep feeling unwell and chilled, then later in the day noticed her leg becoming inflamed.  She has history of cellulitis and this feels the same.  No chest pain or difficulty breathing.  The history is provided by the patient.       Home Medications Prior to Admission medications   Medication Sig Start Date End Date Taking? Authorizing Provider  acetaminophen (TYLENOL) 650 MG CR tablet Take 650-1,300 mg by mouth every 8 (eight) hours as needed for pain.    [provider]  atorvastatin (LIPITOR) 10 MG tablet Take 1 tablet (10 mg total) by mouth daily. 05/20/22   Park Meo, FNP  Calcium Carb-Cholecalciferol (CALCIUM 600+D3 PO) Take 1 tablet by mouth 2 (two) times daily.    [provider]  Cholecalciferol 25 MCG (1000 UT) capsule Take 1,000 Units by mouth daily.    [provider]  clotrimazole (LOTRIMIN AF) 1 % cream Apply 1 application topically 2 (two) times daily. Patient taking differently: Apply 1 application  topically 2 (two) times daily. Applied to toes twice daily for fungus 12/28/18   Donita Brooks, MD  diclofenac (CATAFLAM) 50 MG tablet TAKE ONE TABLET (50MG  TOTAL) BY MOUTH TWO TIMES DAILY 03/10/23   Vickki Hearing, MD  ELIQUIS 5 MG TABS tablet TAKE ONE TABLET BY MOUTH TWICE A DAY 01/21/23   Pricilla Riffle, MD  flecainide (TAMBOCOR) 150 MG tablet TAKE 0.5 TABLETS (75 MG TOTAL) BY MOUTH 2 (TWO) TIMES DAILY 07/24/22   Strader, Lennart Pall, PA-C  furosemide (LASIX) 20 MG tablet TAKE ONE (1) TABLET BY MOUTH EVERY DAY AS  NEEDED 08/28/22   Park Meo, FNP  magnesium oxide (MAG-OX) 400 MG tablet Take 400 mg by mouth daily.    [provider]  metoprolol tartrate (LOPRESSOR) 50 MG tablet TAKE ONE TABLET (50MG  TOTAL) BY MOUTH TWO TIMES DAILY 11/13/22   Pricilla Riffle, MD  potassium chloride (KLOR-CON) 10 MEQ tablet Take 1 tablet (10 mEq total) by mouth daily as needed (when taking Lasix). 08/28/22   Park Meo, FNP  trolamine salicylate (ASPERCREME) 10 % cream Apply 1 application topically 2 (two) times daily as needed for muscle pain.     [provider]      Allergies    Tetanus toxoids    Review of Systems   Review of Systems  All other systems reviewed and are negative.   Physical Exam Updated Vital Signs BP (!) 146/66   Pulse 89   Temp 98.8 F (37.1 C) (Oral)   Resp 16   Ht 5\' 6"  (1.676 m)   Wt 113.4 kg   SpO2 94%   BMI 40.35 kg/m  Physical Exam Vitals and nursing note reviewed.  Constitutional:      General: She is not in acute distress.    Appearance: She is well-developed. She is not diaphoretic.  HENT:     Head: Normocephalic and atraumatic.  Cardiovascular:     Rate and Rhythm: Normal rate and regular rhythm.  Heart sounds: No murmur heard.    No friction rub. No gallop.  Pulmonary:     Effort: Pulmonary effort is normal. No respiratory distress.     Breath sounds: Normal breath sounds. No wheezing.  Abdominal:     General: Bowel sounds are normal. There is no distension.     Palpations: Abdomen is soft.     Tenderness: There is no abdominal tenderness.  Musculoskeletal:        General: Normal range of motion.     Cervical back: Normal range of motion and neck supple.     Comments: There is redness and warmth noted to the left lower extremity in a stocking distribution.  It is warm to the touch.  DP pulses are palpable and motor and sensation are intact to the entire foot.  Skin:    General: Skin is warm and dry.  Neurological:     General: No focal  deficit present.     Mental Status: She is alert and oriented to person, place, and time.     ED Results / Procedures / Treatments   Labs (all labs ordered are listed, but only abnormal results are displayed) Labs Reviewed  COMPREHENSIVE METABOLIC PANEL - Abnormal; Notable for the following components:      Result Value   Glucose, Bld 106 (*)    BUN 28 (*)    Creatinine, Ser 1.01 (*)    GFR, Estimated 57 (*)    All other components within normal limits  CBC WITH DIFFERENTIAL/PLATELET    EKG None  Radiology No results found.  Procedures Procedures    Medications Ordered in ED Medications  cefTRIAXone (ROCEPHIN) 2 g in sodium chloride 0.9 % 100 mL IVPB (has no administration in time range)    ED Course/ Medical Decision Making/ A&P  Patient is a 77 year old female presenting with left leg redness, burning, and warmth as described in the HPI.  Patient arrives here with stable vital signs and is afebrile.  Physical examination reveals erythema, warmth, and tenderness in a stocking distribution to the left leg, but is otherwise unremarkable.  Laboratory studies obtained including CBC and CMP, both of which are unremarkable.  There is no leukocytosis.  Patient has an obvious cellulitis to the leg for which she was given 2 g of IV Rocephin.  She will be discharged with Keflex and as needed return.  Final Clinical Impression(s) / ED Diagnoses Final diagnoses:  None    Rx / DC Orders ED Discharge Orders     None         Geoffery Lyons, MD 03/15/23 0126

## 2023-03-20 ENCOUNTER — Telehealth: Payer: Self-pay

## 2023-03-20 ENCOUNTER — Other Ambulatory Visit: Payer: Medicare PPO

## 2023-03-20 DIAGNOSIS — R7989 Other specified abnormal findings of blood chemistry: Secondary | ICD-10-CM

## 2023-03-20 DIAGNOSIS — E782 Mixed hyperlipidemia: Secondary | ICD-10-CM

## 2023-03-20 DIAGNOSIS — I10 Essential (primary) hypertension: Secondary | ICD-10-CM

## 2023-03-20 NOTE — Telephone Encounter (Signed)
 Copied from CRM [id number]. Topic: Appointments - Scheduling Inquiry for Clinic >> Mar 20, 2023  8:05 AM Alfonso ORN wrote: Reason for CRM: patient want to know if need to come in for her lab today at 03/20/2023 at 10am  reason went to emergency room a week ago want to know if can use the labs done in emergency room for her upcoming physcial , patient has concerns that for as insurance purpose dont want to be billed twice  for labs

## 2023-03-21 LAB — COMPLETE METABOLIC PANEL WITH GFR
AG Ratio: 1.4 (calc) (ref 1.0–2.5)
ALT: 12 U/L (ref 6–29)
AST: 14 U/L (ref 10–35)
Albumin: 3.9 g/dL (ref 3.6–5.1)
Alkaline phosphatase (APISO): 75 U/L (ref 37–153)
BUN/Creatinine Ratio: 27 (calc) — ABNORMAL HIGH (ref 6–22)
BUN: 26 mg/dL — ABNORMAL HIGH (ref 7–25)
CO2: 26 mmol/L (ref 20–32)
Calcium: 9.2 mg/dL (ref 8.6–10.4)
Chloride: 105 mmol/L (ref 98–110)
Creat: 0.95 mg/dL (ref 0.60–1.00)
Globulin: 2.7 g/dL (ref 1.9–3.7)
Glucose, Bld: 93 mg/dL (ref 65–99)
Potassium: 4.6 mmol/L (ref 3.5–5.3)
Sodium: 141 mmol/L (ref 135–146)
Total Bilirubin: 0.4 mg/dL (ref 0.2–1.2)
Total Protein: 6.6 g/dL (ref 6.1–8.1)
eGFR: 62 mL/min/{1.73_m2} (ref >=60)

## 2023-03-21 LAB — LIPID PANEL
Cholesterol: 170 mg/dL (ref <200)
HDL: 46 mg/dL — ABNORMAL LOW (ref >=50)
LDL Cholesterol (Calc): 98 mg/dL
Non-HDL Cholesterol (Calc): 124 mg/dL (ref <130)
Total CHOL/HDL Ratio: 3.7 (calc) (ref <5.0)
Triglycerides: 159 mg/dL — ABNORMAL HIGH (ref <150)

## 2023-03-27 ENCOUNTER — Ambulatory Visit (INDEPENDENT_AMBULATORY_CARE_PROVIDER_SITE_OTHER): Payer: Medicare PPO | Admitting: Family Medicine

## 2023-03-27 VITALS — BP 132/82 | HR 65 | Temp 97.6°F | Ht 66.0 in | Wt 255.0 lb

## 2023-03-27 DIAGNOSIS — Z0000 Encounter for general adult medical examination without abnormal findings: Secondary | ICD-10-CM

## 2023-03-27 DIAGNOSIS — E782 Mixed hyperlipidemia: Secondary | ICD-10-CM

## 2023-03-27 DIAGNOSIS — R600 Localized edema: Secondary | ICD-10-CM

## 2023-03-27 DIAGNOSIS — I482 Chronic atrial fibrillation, unspecified: Secondary | ICD-10-CM

## 2023-03-27 DIAGNOSIS — Z0001 Encounter for general adult medical examination with abnormal findings: Secondary | ICD-10-CM

## 2023-03-27 DIAGNOSIS — Z23 Encounter for immunization: Secondary | ICD-10-CM | POA: Diagnosis not present

## 2023-03-27 DIAGNOSIS — I10 Essential (primary) hypertension: Secondary | ICD-10-CM | POA: Diagnosis not present

## 2023-03-27 NOTE — Assessment & Plan Note (Signed)
 Chronic well controlled. Followed by Cardiology. Recommend heart healthy diet such as Mediterranean diet with whole grains, fruits, vegetable, fish, lean meats, nuts, and olive oil. Limit salt. Encouraged moderate intensity exercise as tolerated. Avoid tobacco products. Avoid excess alcohol. Take medications as prescribed and bring medications and blood pressure log with cuff to each office visit. Seek medical care for chest pain, palpitations, shortness of breath with exertion, dizziness/lightheadedness, vision changes, recurrent headaches, or swelling of extremities.

## 2023-03-27 NOTE — Assessment & Plan Note (Signed)
 At baseline per Pt. She did have recent cellulitis and was treated with antibiotics, has completed course and symptoms improved. Her legs do remain somewhat edematous and erythematous. I offered vein and vascular referral but she declined today. Encouraged compression stocking use. Lasix  PRN.

## 2023-03-27 NOTE — Assessment & Plan Note (Signed)

## 2023-03-27 NOTE — Assessment & Plan Note (Signed)
 Chronic. Continue Atorvastatin . I recommend consuming a heart healthy diet such as Mediterranean diet or DASH diet with whole grains, fruits, vegetable, fish, lean meats, nuts, and olive oil. Limit sweets and processed foods. I also encourage moderate intensity exercise 150 minutes weekly as tolerated. Followed by Cardiology.

## 2023-03-27 NOTE — Progress Notes (Signed)
 Complete physical exam  Patient: Amanda Mckay   DOB: 05/14/1945   78 y.o. Female  MRN: [id number]  Subjective:    Chief Complaint  Patient presents with   Annual Exam    Amanda Mckay is a 78 y.o. female who presents today for a complete physical exam. She reports consuming a general and low sodium diet. Exercise is limited by cardiac condition(s): CHF, afib. She generally feels well. She reports sleeping well. She does not have additional problems to discuss today.   HYPERTENSION / HYPERLIPIDEMIA Satisfied with current treatment? yes Duration of hypertension: chronic BP monitoring frequency: not checking BP range:  BP medication side effects: no Past BP meds: METOPROLOL  Duration of hyperlipidemia: chronic Cholesterol medication side effects: no Cholesterol supplements: none Past cholesterol medications: atorvastain (lipitor) Medication compliance: excellent compliance Aspirin : no Recent stressors: no Recurrent headaches: no Visual changes: no Palpitations: no Dyspnea: no Chest pain: no Lower extremity edema: yes Dizzy/lightheaded: no  ATRIAL FIBRILLATION Atrial fibrillation status: controlled Satisfied with current treatment: yes  Medication side effects:  no Medication compliance: excellent compliance Etiology of atrial fibrillation:  Palpitations:  no Chest pain:  no Dyspnea on exertion:  no Orthopnea:  no Syncope:  no Edema:  yes Ventricular rate control: B-blocker Anti-coagulation: long acting   Most recent fall risk assessment:    03/27/2023   10:05 AM  Fall Risk   Falls in the past year? 1  Number falls in past yr: 0  Injury with Fall? 0  Risk for fall due to : History of fall(s);Impaired balance/gait;Orthopedic patient  Follow up Education provided;Falls prevention discussed;Falls evaluation completed     Most recent depression screenings:    03/27/2023   10:05 AM 04/25/2022    1:52 PM  PHQ 2/9 Scores  PHQ - 2 Score 0 0     Vision:Not within last year  and Dental: No current dental problems  Patient Active Problem List   Diagnosis Date Noted   Positive colorectal cancer screening using Cologuard test 05/02/2022   Physical exam, annual 03/25/2022   Gastroesophageal reflux disease 09/23/2020   AF (paroxysmal atrial fibrillation) (HCC) 06/07/2020   RLS (restless legs syndrome) 01/04/2020   Osteopenia 12/31/2019   Compression fracture of L1 lumbar vertebra (HCC) 12/06/2019   Spondylolisthesis at L4-L5 level 12/06/2019   Class 3 obesity 09/01/2019   Pincer nail deformity 06/10/2019   S/P right knee arthroscopy 01/28/2019 03/08/2019   Derangement of posterior horn of medial meniscus of right knee    Primary osteoarthritis of right knee    Pain due to onychomycosis of toenails of both feet 12/07/2018   Peripheral edema 12/01/2018   Essential hypertension    Chronic diastolic HF (heart failure) (HCC)    Ventral hernia without obstruction or gangrene 08/01/2017   Hyperlipidemia 02/27/2017   Vitamin D  deficiency 02/27/2017   Hereditary hemochromatosis (HCC) 02/27/2017   Atrial fibrillation, chronic 02/27/2017   Past Medical History:  Diagnosis Date   Arthritis    Atrial fibrillation (HCC)    Cataract    Congestive heart failure (HCC)    Dysrhythmia    AFib   GERD (gastroesophageal reflux disease)    Hemochromatosis    Iron excess    Spondylosis of lumbar spine    Past Surgical History:  Procedure Laterality Date   BIOPSY  06/13/2022   Procedure: BIOPSY;  Surgeon: Eartha Angelia Sieving, MD;  Location: [facility];  Service: Gastroenterology;;   CESAREAN SECTION     COLONOSCOPY WITH PROPOFOL   N/A 06/13/2022   Procedure: COLONOSCOPY WITH PROPOFOL ;  Surgeon: Eartha Angelia Sieving, MD;  Location: [facility];  Service: Gastroenterology;  Laterality: N/A;  10:45AM;ASA 3   KNEE ARTHROSCOPY WITH MEDIAL MENISECTOMY Right 01/28/2019   Procedure: RIGHT KNEE ARTHROSCOPY WITH MEDIAL MENISCECTOMY;   Surgeon: Margrette Taft BRAVO, MD;  Location: [facility];  Service: Orthopedics;  Laterality: Right;   POLYPECTOMY  06/13/2022   Procedure: POLYPECTOMY;  Surgeon: Eartha Angelia Sieving, MD;  Location: [facility];  Service: Gastroenterology;;   TONSILLECTOMY     VENTRAL HERNIA REPAIR N/A 01/26/2018   Procedure: HERNIA REPAIR VENTRAL ADULT WITH MESH;  Surgeon: Kallie Manuelita BROCKS, MD;  Location: [facility];  Service: General;  Laterality: N/A;   Social History   Tobacco Use   Smoking status: Never    Passive exposure: Past   Smokeless tobacco: Never  Vaping Use   Vaping status: Never Used  Substance Use Topics   Alcohol use: Yes    Alcohol/week: 2.0 standard drinks of alcohol    Types: 2 Glasses of wine per week    Comment: sometimes   Drug use: No   Family History  Problem Relation Age of Onset   Stroke Mother    Atrial fibrillation Son    Stroke Maternal Grandmother    Diabetes Paternal Grandfather    Arthritis Brother    Allergies  Allergen Reactions   Tetanus Toxoids Other (See Comments)    Reaction occurred more than 40 yrs ago--does not remember what occurred.      Patient Care Team: Kayla Jeoffrey RAMAN, FNP as PCP - General (Family Medicine) Okey Vina GAILS, MD as PCP - Cardiology (Cardiology) Loreda Hacker, DPM as Consulting Physician (Podiatry) Register, Jeoffrey, PA-C as Physician Assistant (Dermatology) Elma Zachary RAMAN [facility])   Outpatient Medications Prior to Visit  Medication Sig   acetaminophen  (TYLENOL ) 650 MG CR tablet Take 650-1,300 mg by mouth every 8 (eight) hours as needed for pain.   atorvastatin  (LIPITOR) 10 MG tablet Take 1 tablet (10 mg total) by mouth daily.   Calcium  Carb-Cholecalciferol (CALCIUM  600+D3 PO) Take 1 tablet by mouth 2 (two) times daily.   Cholecalciferol 25 MCG (1000 UT) capsule Take 1,000 Units by mouth daily.   clotrimazole  (LOTRIMIN  AF) 1 % cream Apply 1 application topically 2 (two) times daily. (Patient taking differently: Apply  1 application  topically 2 (two) times daily. Applied to toes twice daily for fungus)   diclofenac  (CATAFLAM ) 50 MG tablet TAKE ONE TABLET (50MG  TOTAL) BY MOUTH TWO TIMES DAILY   ELIQUIS  5 MG TABS tablet TAKE ONE TABLET BY MOUTH TWICE A DAY   flecainide  (TAMBOCOR ) 150 MG tablet TAKE 0.5 TABLETS (75 MG TOTAL) BY MOUTH 2 (TWO) TIMES DAILY   furosemide  (LASIX ) 20 MG tablet TAKE ONE (1) TABLET BY MOUTH EVERY DAY AS NEEDED   magnesium oxide (MAG-OX) 400 MG tablet Take 400 mg by mouth daily.   metoprolol  tartrate (LOPRESSOR ) 50 MG tablet TAKE ONE TABLET (50MG  TOTAL) BY MOUTH TWO TIMES DAILY   potassium chloride  (KLOR-CON ) 10 MEQ tablet Take 1 tablet (10 mEq total) by mouth daily as needed (when taking Lasix ).   trolamine salicylate (ASPERCREME) 10 % cream Apply 1 application topically 2 (two) times daily as needed for muscle pain.    [DISCONTINUED] cephALEXin  (KEFLEX ) 500 MG capsule Take 1 capsule (500 mg total) by mouth 4 (four) times daily. (Patient not taking: Reported on 03/27/2023)   No facility-administered medications prior to visit.    Review of  Systems  Constitutional: Negative.   HENT: Negative.    Eyes: Negative.   Respiratory: Negative.    Cardiovascular:  Positive for leg swelling.  Gastrointestinal: Negative.   Genitourinary: Negative.   Musculoskeletal: Negative.   Skin: Negative.   Neurological: Negative.   Endo/Heme/Allergies: Negative.   Psychiatric/Behavioral: Negative.    All other systems reviewed and are negative.         Objective:     BP 132/82   Pulse 65   Temp 97.6 F (36.4 C)   Ht 5' 6 (1.676 m)   Wt 255 lb (115.7 kg)   SpO2 96%   BMI 41.16 kg/m  BP Readings from Last 3 Encounters:  03/27/23 132/82  03/14/23 (!) 146/66  09/11/22 116/68   Wt Readings from Last 3 Encounters:  03/27/23 255 lb (115.7 kg)  03/14/23 250 lb (113.4 kg)  12/10/22 250 lb (113.4 kg)      Physical Exam Vitals and nursing note reviewed.  Constitutional:       Appearance: Normal appearance. She is obese.  HENT:     Head: Normocephalic and atraumatic.     Right Ear: Tympanic membrane, ear canal and external ear normal.     Left Ear: Tympanic membrane, ear canal and external ear normal.     Nose: Nose normal.     Mouth/Throat:     Mouth: Mucous membranes are moist.     Pharynx: Oropharynx is clear.  Eyes:     Extraocular Movements: Extraocular movements intact.     Conjunctiva/sclera: Conjunctivae normal.     Pupils: Pupils are equal, round, and reactive to light.  Cardiovascular:     Rate and Rhythm: Normal rate and regular rhythm.     Pulses: Normal pulses.     Heart sounds: Normal heart sounds.  Pulmonary:     Effort: Pulmonary effort is normal.     Breath sounds: Normal breath sounds.  Abdominal:     General: Bowel sounds are normal.     Palpations: Abdomen is soft.  Musculoskeletal:        General: Normal range of motion.     Cervical back: Normal range of motion and neck supple.     Right lower leg: Edema present.     Left lower leg: Edema present.  Skin:    General: Skin is warm and dry.     Capillary Refill: Capillary refill takes less than 2 seconds.     Findings: Erythema present.       Neurological:     General: No focal deficit present.     Mental Status: She is alert and oriented to person, place, and time. Mental status is at baseline.  Psychiatric:        Mood and Affect: Mood normal.        Behavior: Behavior normal.        Thought Content: Thought content normal.        Judgment: Judgment normal.      No results found for any visits on 03/27/23. Last CBC Lab Results  Component Value Date   WBC 7.5 03/14/2023   HGB 14.9 03/14/2023   HCT 44.2 03/14/2023   MCV 98.4 03/14/2023   MCH 33.2 03/14/2023   RDW 12.1 03/14/2023   PLT 162 03/14/2023   Last metabolic panel Lab Results  Component Value Date   GLUCOSE 93 03/20/2023   NA 141 03/20/2023   K 4.6 03/20/2023   CL 105 03/20/2023   CO2 26 03/20/2023  BUN 26 (H) 03/20/2023   CREATININE 0.95 03/20/2023   EGFR 62 03/20/2023   CALCIUM  9.2 03/20/2023   PROT 6.6 03/20/2023   ALBUMIN 4.0 03/14/2023   BILITOT 0.4 03/20/2023   ALKPHOS 70 03/14/2023   AST 14 03/20/2023   ALT 12 03/20/2023   ANIONGAP 10 03/14/2023   Last lipids Lab Results  Component Value Date   CHOL 170 03/20/2023   HDL 46 (L) 03/20/2023   LDLCALC 98 03/20/2023   TRIG 159 (H) 03/20/2023   CHOLHDL 3.7 03/20/2023   Last hemoglobin A1c Lab Results  Component Value Date   HGBA1C 5.4 06/08/2020   Last thyroid  functions Lab Results  Component Value Date   TSH 3.161 06/30/2020   Last vitamin D  Lab Results  Component Value Date   VD25OH 51 10/13/2020   Last vitamin B12 and Folate No results found for: VITAMINB12, FOLATE      Assessment & Plan:    Routine Health Maintenance and Physical Exam  Immunization History  Administered Date(s) Administered   Fluad Quad(high Dose 65+) 12/30/2018, 12/06/2019, 01/04/2021   Fluad Trivalent(High Dose 65+) 12/10/2022   Influenza, High Dose Seasonal PF 01/05/2018   Influenza,inj,Quad PF,6+ Mos 02/27/2017   Moderna Sars-Covid-2 Vaccination 05/12/2019, 06/09/2019, 01/22/2020   Pneumococcal Conjugate-13 01/18/2014   Pneumococcal Polysaccharide-23 04/11/2011   Zoster Recombinant(Shingrix ) 03/27/2023    Health Maintenance  Topic Date Due   Medicare Annual Wellness (AWV)  04/26/2023   COVID-19 Vaccine (4 - 2024-25 season) 04/25/2023 (Originally 11/17/2022)   Zoster Vaccines- Shingrix  (2 of 2) 06/25/2023 (Originally 05/22/2023)   Pneumonia Vaccine 19+ Years old  Completed   INFLUENZA VACCINE  Completed   HPV VACCINES  Aged Out   DTaP/Tdap/Td  Discontinued   DEXA SCAN  Discontinued   Hepatitis C Screening  Discontinued   Fecal DNA (Cologuard)  Discontinued    Discussed health benefits of physical activity, and encouraged her to engage in regular exercise appropriate for her age and condition.  Problem List Items  Addressed This Visit     Hyperlipidemia   Chronic. Continue Atorvastatin . I recommend consuming a heart healthy diet such as Mediterranean diet or DASH diet with whole grains, fruits, vegetable, fish, lean meats, nuts, and olive oil. Limit sweets and processed foods. I also encourage moderate intensity exercise 150 minutes weekly as tolerated. Followed by Cardiology.      Atrial fibrillation, chronic   Well controlled on Metoprolol , Flecainide , Eliquis . Regular rate and rhythm today and no symptoms. Tolerating medication regimen well. Following up annually with cardiology. Denies chest pain, palpitations, or shortness of breath.      Essential hypertension   Chronic well controlled. Followed by Cardiology. Recommend heart healthy diet such as Mediterranean diet with whole grains, fruits, vegetable, fish, lean meats, nuts, and olive oil. Limit salt. Encouraged moderate intensity exercise as tolerated. Avoid tobacco products. Avoid excess alcohol. Take medications as prescribed and bring medications and blood pressure log with cuff to each office visit. Seek medical care for chest pain, palpitations, shortness of breath with exertion, dizziness/lightheadedness, vision changes, recurrent headaches, or swelling of extremities.       Peripheral edema   At baseline per Pt. She did have recent cellulitis and was treated with antibiotics, has completed course and symptoms improved. Her legs do remain somewhat edematous and erythematous. I offered vein and vascular referral but she declined today. Encouraged compression stocking use. Lasix  PRN.      Physical exam, annual - Primary   Today your medical history was  reviewed and routine physical exam with labs was performed. Recommend 150 minutes of moderate intensity exercise weekly and consuming a well-balanced diet. Advised to stop smoking if a smoker, avoid smoking if a non-smoker, limit alcohol consumption to 1 drink per day for women and 2 drinks per  day for men, and avoid illicit drug use. Counseled in mental health awareness and when to seek medical care. Vaccine maintenance discussed. Appropriate health maintenance items reviewed. Return to office in 1 year for annual physical exam.       Other Visit Diagnoses       Need for vaccination       Relevant Orders   Varicella-zoster vaccine IM (Completed)      Return in about 6 months (around 09/24/2023) for chronic follow-up with labs 1 week prior.     Jeoffrey GORMAN Barrio, FNP

## 2023-03-27 NOTE — Assessment & Plan Note (Signed)
Well controlled on Metoprolol, Flecainide, Eliquis. Regular rate and rhythm today and no symptoms. Tolerating medication regimen well. Following up annually with cardiology. Denies chest pain, palpitations, or shortness of breath.

## 2023-04-21 ENCOUNTER — Ambulatory Visit: Payer: Medicare PPO | Admitting: Podiatry

## 2023-04-21 ENCOUNTER — Encounter: Payer: Self-pay | Admitting: Podiatry

## 2023-04-21 DIAGNOSIS — B351 Tinea unguium: Secondary | ICD-10-CM

## 2023-04-21 DIAGNOSIS — M79674 Pain in right toe(s): Secondary | ICD-10-CM | POA: Diagnosis not present

## 2023-04-21 DIAGNOSIS — M79675 Pain in left toe(s): Secondary | ICD-10-CM | POA: Diagnosis not present

## 2023-04-22 NOTE — Progress Notes (Signed)
  Subjective:  Patient ID: Amanda Mckay, female    DOB: [birth year],  MRN: [id number]  78 y.o. female presents with painful mycotic toenails x 10 which interfere with daily activities. Pain is relieved with periodic professional debridement. Patient has h/o lymphedema. States she has been treated for LE cellulitis. Chief Complaint  Patient presents with   Nail Problem    "I think it's just a toenail cutting."    PCP: Park Meo, FNP.  New problem(s): None.   Review of Systems: Negative except as noted in the HPI.   Allergies  Allergen Reactions   Tetanus Toxoids Other (See Comments)    Reaction occurred more than 40 yrs ago--does not remember what occurred.    Objective:  There were no vitals filed for this visit. Constitutional Patient is a pleasant 78 y.o. female WD, WN in NAD. AAO x 3.  Vascular Capillary fill time to digits <3 seconds.  DP/PT pulse(s) are faintly palpable b/l lower extremities. Pedal hair absent b/l. Lower extremity skin temperature gradient warm to cool b/l. No pain with calf compression b/l. No cyanosis or clubbing noted. No ischemia nor gangrene noted b/l. Lymphedema present BLE.  Neurologic Protective sensation intact 5/5 intact bilaterally with 10g monofilament b/l. Vibratory sensation intact b/l. No clonus b/l.   Dermatologic Pedal skin is thin, shiny and atrophic b/l.  No open wounds b/l lower extremities. No interdigital macerations b/l lower extremities. Toenails 2-5 b/l elongated, discolored, dystrophic, thickened, crumbly with subungual debris and tenderness to dorsal palpation. Pincer nail deformity bilateral great toes. No erythema, no edema, no drainage, no fluctuance. Nail border hypertrophy absemt.  Sign(s) of infection: no clinical signs of infection noted on examination today..  Orthopedic: Normal muscle strength 5/5 to all lower extremity muscle groups bilaterally. Hammertoe deformity noted 2-5 b/l. Utilizes rollator for ambulation  assistance.   Last HgA1c:      No data to display           Assessment:   1. Pain due to onychomycosis of toenails of both feet    Plan:  Patient was evaluated and treated. All patient's and/or POA's questions/concerns addressed on today's visit. Mycotic toenails 1-5 debrided in length and girth without incident. Continue soft, supportive shoe gear daily. Report any pedal injuries to medical professional. Call office if there are any quesitons/concerns. -Patient/POA to call should there be question/concern in the interim.  Return in about 3 months (around 07/19/2023).  Freddie Breech, DPM      [facility] LOCATION: [address] [place] [address]                   Office [phone]   [facility] LOCATION: [address] [place], [place] [address] Office [phone]

## 2023-05-01 ENCOUNTER — Ambulatory Visit: Payer: Medicare PPO | Admitting: *Deleted

## 2023-05-01 DIAGNOSIS — Z0000 Encounter for general adult medical examination without abnormal findings: Secondary | ICD-10-CM

## 2023-05-01 NOTE — Progress Notes (Signed)
 Subjective:   Amanda Mckay is a 78 y.o. female who presents for Medicare Annual (Subsequent) preventive examination.  Visit Complete: Virtual I connected with  Ihor Dow Bowron on 05/01/23 by a audio enabled telemedicine application and verified that I am speaking with the correct person using two identifiers.  Patient Location: Home  Provider Location: Home Office  I discussed the limitations of evaluation and management by telemedicine. The patient expressed understanding and agreed to proceed.  Vital Signs: Because this visit was a virtual/telehealth visit, some criteria may be missing or patient reported. Any vitals not documented were not able to be obtained and vitals that have been documented are patient reported.   Cardiac Risk Factors include: advanced age (>103men, >64 women)     Objective:    There were no vitals filed for this visit. There is no height or weight on file to calculate BMI.     05/01/2023   11:47 AM 03/14/2023    7:44 PM 06/11/2022   11:31 AM 04/25/2022    1:54 PM 06/30/2020   11:39 AM 06/07/2020    6:29 PM 01/03/2020    9:31 AM  Advanced Directives  Does Patient Have a Medical Advance Directive? No No No No No No No  Would patient like information on creating a medical advance directive? No - Patient declined No - Patient declined No - Patient declined No - Patient declined   No - Patient declined    Current Medications (verified) Outpatient Encounter Medications as of 05/01/2023  Medication Sig   acetaminophen (TYLENOL) 650 MG CR tablet Take 650-1,300 mg by mouth every 8 (eight) hours as needed for pain.   atorvastatin (LIPITOR) 10 MG tablet Take 1 tablet (10 mg total) by mouth daily.   Calcium Carb-Cholecalciferol (CALCIUM 600+D3 PO) Take 1 tablet by mouth 2 (two) times daily.   Cholecalciferol 25 MCG (1000 UT) capsule Take 1,000 Units by mouth daily.   clotrimazole (LOTRIMIN AF) 1 % cream Apply 1 application topically 2 (two) times  daily. (Patient taking differently: Apply 1 application  topically 2 (two) times daily. Applied to toes twice daily for fungus)   diclofenac (CATAFLAM) 50 MG tablet TAKE ONE TABLET (50MG  TOTAL) BY MOUTH TWO TIMES DAILY   ELIQUIS 5 MG TABS tablet TAKE ONE TABLET BY MOUTH TWICE A DAY   flecainide (TAMBOCOR) 150 MG tablet TAKE 0.5 TABLETS (75 MG TOTAL) BY MOUTH 2 (TWO) TIMES DAILY   furosemide (LASIX) 20 MG tablet TAKE ONE (1) TABLET BY MOUTH EVERY DAY AS NEEDED   magnesium oxide (MAG-OX) 400 MG tablet Take 400 mg by mouth daily.   metoprolol tartrate (LOPRESSOR) 50 MG tablet TAKE ONE TABLET (50MG  TOTAL) BY MOUTH TWO TIMES DAILY   potassium chloride (KLOR-CON) 10 MEQ tablet Take 1 tablet (10 mEq total) by mouth daily as needed (when taking Lasix).   trolamine salicylate (ASPERCREME) 10 % cream Apply 1 application topically 2 (two) times daily as needed for muscle pain.    No facility-administered encounter medications on file as of 05/01/2023.    Allergies (verified) Tetanus toxoids   History: Past Medical History:  Diagnosis Date   Arthritis    Atrial fibrillation (HCC)    Cataract    Congestive heart failure (HCC)    Dysrhythmia    AFib   GERD (gastroesophageal reflux disease)    Hemochromatosis    Iron excess    Spondylosis of lumbar spine    Past Surgical History:  Procedure Laterality Date  BIOPSY  06/13/2022   Procedure: BIOPSY;  Surgeon: Dolores Frame, MD;  [facility];  Service: Gastroenterology;;   CESAREAN SECTION     COLONOSCOPY WITH PROPOFOL N/A 06/13/2022   Procedure: COLONOSCOPY WITH PROPOFOL;  Surgeon: Dolores Frame, MD;  [facility]: [facility];  Service: Gastroenterology;  Laterality: N/A;  10:45AM;ASA 3   KNEE ARTHROSCOPY WITH MEDIAL MENISECTOMY Right 01/28/2019   Procedure: RIGHT KNEE ARTHROSCOPY WITH MEDIAL MENISCECTOMY;  Surgeon: Vickki Hearing, MD;  Location: [facility];  Service: Orthopedics;  Laterality: Right;    POLYPECTOMY  06/13/2022   Procedure: POLYPECTOMY;  Surgeon: Dolores Frame, MD;  [facility]: [facility];  Service: Gastroenterology;;   TONSILLECTOMY     VENTRAL HERNIA REPAIR N/A 01/26/2018   Procedure: HERNIA REPAIR VENTRAL ADULT WITH MESH;  Surgeon: Lucretia Roers, MD;  Location: [facility];  Service: General;  Laterality: N/A;   Family History  Problem Relation Age of Onset   Stroke Mother    Atrial fibrillation Son    Stroke Maternal Grandmother    Diabetes Paternal Grandfather    Arthritis Brother    Social History   Socioeconomic History   Marital status: Married    Spouse name: Not on file   Number of children: Not on file   Years of education: Not on file   Highest education level: Not on file  Occupational History   Not on file  Tobacco Use   Smoking status: Never    Passive exposure: Past   Smokeless tobacco: Never  Vaping Use   Vaping status: Never Used  Substance and Sexual Activity   Alcohol use: Yes    Alcohol/week: 2.0 standard drinks of alcohol    Types: 2 Glasses of wine per week    Comment: sometimes   Drug use: No   Sexual activity: Yes  Other Topics Concern   Not on file  Social History Narrative   Retired Armed forces operational officer.   Married.   Has children 3. Grandchildren, 2.    Grew up in [place], [place].   Married for over 31 years.    Eats all food groups.   Just moved to [place], [place] in 8/18.    Social Drivers of Corporate investment banker Strain: Low Risk  (05/01/2023)   Overall Financial Resource Strain (CARDIA)    Difficulty of Paying Living Expenses: Not hard at all  Food Insecurity: No Food Insecurity (05/01/2023)   Hunger Vital Sign    Worried About Running Out of Food in the Last Year: Never true    Ran Out of Food in the Last Year: Never true  Transportation Needs: No Transportation Needs (05/01/2023)   PRAPARE - Administrator, Civil Service (Medical): No    Lack of Transportation (Non-Medical): No  Physical  Activity: Inactive (05/01/2023)   Exercise Vital Sign    Days of Exercise per Week: 0 days    Minutes of Exercise per Session: 0 min  Stress: No Stress Concern Present (05/01/2023)   [employer] of Occupational Health - Occupational Stress Questionnaire    Feeling of Stress : Not at all  Social Connections: Moderately Integrated (05/01/2023)   Social Connection and Isolation Panel [NHANES]    Frequency of Communication with Friends and Family: More than three times a week    Frequency of Social Gatherings with Friends and Family: Twice a week    Attends Religious Services: 1 to 4 times per year    Active Member  of Clubs or Organizations: No    Attends Banker Meetings: Never    Marital Status: Married    Tobacco Counseling Counseling given: Not Answered   Clinical Intake:  Pre-visit preparation completed: Yes  Pain : No/denies pain     Diabetes: No  How often do you need to have someone help you when you read instructions, pamphlets, or other written materials from your doctor or pharmacy?: 1 - Never  Interpreter Needed?: No  Information entered by :: Remi Haggard LPN   Activities of Daily Living    05/01/2023   11:48 AM 06/11/2022   11:30 AM  In your present state of health, do you have any difficulty performing the following activities:  Hearing? 0 0  Vision? 0 0  Difficulty concentrating or making decisions? 0 0  Walking or climbing stairs? 1 0  Dressing or bathing? 0 0  Doing errands, shopping? 0   Preparing Food and eating ? N   Using the Toilet? N   In the past six months, have you accidently leaked urine? N   Do you have problems with loss of bowel control? N   Managing your Medications? N   Managing your Finances? N   Housekeeping or managing your Housekeeping? N     Patient Care Team: Park Meo, FNP as PCP - General (Family Medicine) Pricilla Riffle, MD as PCP - Cardiology (Cardiology) Helane Gunther, DPM as Consulting Physician  (Podiatry) Register, Joice Lofts, PA-C as Physician Assistant (Dermatology) Marlene Bast [facility])  Indicate any recent Medical Services you may have received from other than [facility] providers in the past year (date may be approximate).     Assessment:   This is a routine wellness examination for [facility].  Hearing/Vision screen Hearing Screening - Comments:: No trouble hearing Vision Screening - Comments:: Up to date [place]   Goals Addressed             This Visit's Progress    Patient Stated       Keep gardening  Continue current lifestyle       Depression Screen    05/01/2023   11:55 AM 03/27/2023   10:05 AM 04/25/2022    1:52 PM 03/25/2022    9:51 AM 02/04/2022   10:48 AM 09/21/2020   10:18 AM 01/03/2020    9:27 AM  PHQ 2/9 Scores  PHQ - 2 Score 0 0 0 0 0 0 0  PHQ- 9 Score 0   0 0      Fall Risk    05/01/2023   11:44 AM 03/27/2023   10:05 AM 04/25/2022   12:46 PM 04/21/2022    9:00 AM 03/25/2022    9:51 AM  Fall Risk   Falls in the past year? 0 1 0 0 0  Number falls in past yr: 0 0 0 0   Injury with Fall? 0 0 0 0   Risk for fall due to :  History of fall(s);Impaired balance/gait;Orthopedic patient     Follow up Falls evaluation completed;Education provided;Falls prevention discussed Education provided;Falls prevention discussed;Falls evaluation completed Falls prevention discussed;Education provided;Falls evaluation completed      MEDICARE RISK AT HOME: Medicare Risk at Home Any stairs in or around the home?: Yes If so, are there any without handrails?: No Home free of loose throw rugs in walkways, pet beds, electrical cords, etc?: Yes Adequate lighting in your home to reduce risk of falls?: Yes Life alert?: No Use of a cane, walker or  w/c?: Yes Grab bars in the bathroom?: Yes Shower chair or bench in shower?: Yes Elevated toilet seat or a handicapped toilet?: Yes  TIMED UP AND GO:  Was the test performed?  No    Cognitive Function:        05/01/2023    11:45 AM 04/25/2022    1:55 PM  6CIT Screen  What Year? 0 points 0 points  What month? 0 points 0 points  What time? 0 points 0 points  Count back from 20 0 points 0 points  Months in reverse 0 points 0 points  Repeat phrase 0 points 0 points  Total Score 0 points 0 points    Immunizations Immunization History  Administered Date(s) Administered   Fluad Quad(high Dose 65+) 12/30/2018, 12/06/2019, 01/04/2021   Fluad Trivalent(High Dose 65+) 12/10/2022   Influenza, High Dose Seasonal PF 01/05/2018   Influenza,inj,Quad PF,6+ Mos 02/27/2017   Moderna Sars-Covid-2 Vaccination 05/12/2019, 06/09/2019, 01/22/2020   Pneumococcal Conjugate-13 01/18/2014   Pneumococcal Polysaccharide-23 04/11/2011   Zoster Recombinant(Shingrix) 03/27/2023    TDAP status: Due, Education has been provided regarding the importance of this vaccine. Advised may receive this vaccine at local pharmacy or Health Dept. Aware to provide a copy of the vaccination record if obtained from local pharmacy or Health Dept. Verbalized acceptance and understanding.  Flu Vaccine status: Up to date  Pneumococcal vaccine status: Up to date  Covid-19 vaccine status: Information provided on how to obtain vaccines.   Qualifies for Shingles Vaccine? Yes   Zostavax completed No   Shingrix Completed?: No.    Education has been provided regarding the importance of this vaccine. Patient has been advised to call insurance company to determine out of pocket expense if they have not yet received this vaccine. Advised may also receive vaccine at local pharmacy or Health Dept. Verbalized acceptance and understanding.  Screening Tests Health Maintenance  Topic Date Due   COVID-19 Vaccine (4 - 2024-25 season) 11/17/2022   Zoster Vaccines- Shingrix (2 of 2) 06/25/2023 (Originally 05/22/2023)   Medicare Annual Wellness (AWV)  04/30/2024   Pneumonia Vaccine 20+ Years old  Completed   INFLUENZA VACCINE  Completed   HPV VACCINES  Aged Out    DTaP/Tdap/Td  Discontinued   DEXA SCAN  Discontinued   Hepatitis C Screening  Discontinued   Fecal DNA (Cologuard)  Discontinued    Health Maintenance  Health Maintenance Due  Topic Date Due   COVID-19 Vaccine (4 - 2024-25 season) 11/17/2022    Colorectal cancer screening: No longer required.   Mammogram status: No longer required due to  .  Bone Density status: Completed 2021. Results reflect: Bone density results: OSTEOPENIA. Repeat every 5 years.  Lung Cancer Screening: (Low Dose CT Chest recommended if Age 22-80 years, 20 pack-year currently smoking OR have quit w/in 15years.) does not qualify.   Lung Cancer Screening Referral:   Additional Screening:  Hepatitis C Screening:  never done  Vision Screening: Recommended annual ophthalmology exams for early detection of glaucoma and other disorders of the eye. Is the patient up to date with their annual eye exam?  Yes  Who is the provider or what is the name of the office in which the patient attends annual eye exams? [facility] If pt is not established with a provider, would they like to be referred to a provider to establish care? No .   Dental Screening: Recommended annual dental exams for proper oral hygiene    Community Resource Referral / Chronic Care Management: CRR  required this visit?  No   CCM required this visit?  No     Plan:     I have personally reviewed and noted the following in the patient's chart:   Medical and social history Use of alcohol, tobacco or illicit drugs  Current medications and supplements including opioid prescriptions. Patient is not currently taking opioid prescriptions. Functional ability and status Nutritional status Physical activity Advanced directives List of other physicians Hospitalizations, surgeries, and ER visits in previous 12 months Vitals Screenings to include cognitive, depression, and falls Referrals and appointments  In addition, I have reviewed and discussed  with patient certain preventive protocols, quality metrics, and best practice recommendations. A written personalized care plan for preventive services as well as general preventive health recommendations were provided to patient.     Remi Haggard, LPN   9/81/1914   After Visit Summary: (MyChart) Due to this being a telephonic visit, the after visit summary with patients personalized plan was offered to patient via MyChart   Nurse Notes:

## 2023-05-01 NOTE — Patient Instructions (Signed)
 Amanda Mckay , Thank you for taking time to come for your Medicare Wellness Visit. I appreciate your ongoing commitment to your health goals. Please review the following plan we discussed and let me know if I can assist you in the future.   Screening recommendations/referrals: Colonoscopy: no longer required Mammogram: no longer required Bone Density: up to date Recommended yearly ophthalmology/optometry visit for glaucoma screening and checkup Recommended yearly dental visit for hygiene and checkup  Vaccinations: Influenza vaccine: up to date Pneumococcal vaccine: up to date Tdap vaccine: Education provided      Preventive Care 65 Years and Older, Female Preventive care refers to lifestyle choices and visits with your health care provider that can promote health and wellness. What does preventive care include? A yearly physical exam. This is also called an annual well check. Dental exams once or twice a year. Routine eye exams. Ask your health care provider how often you should have your eyes checked. Personal lifestyle choices, including: Daily care of your teeth and gums. Regular physical activity. Eating a healthy diet. Avoiding tobacco and drug use. Limiting alcohol use. Practicing safe sex. Taking low-dose aspirin every day. Taking vitamin and mineral supplements as recommended by your health care provider. What happens during an annual well check? The services and screenings done by your health care provider during your annual well check will depend on your age, overall health, lifestyle risk factors, and family history of disease. Counseling  Your health care provider may ask you questions about your: Alcohol use. Tobacco use. Drug use. Emotional well-being. Home and relationship well-being. Sexual activity. Eating habits. History of falls. Memory and ability to understand (cognition). Work and work Astronomer. Reproductive health. Screening  You may have the  following tests or measurements: Height, weight, and BMI. Blood pressure. Lipid and cholesterol levels. These may be checked every 5 years, or more frequently if you are over 78 years old. Skin check. Lung cancer screening. You may have this screening every year starting at age 78 if you have a 30-pack-year history of smoking and currently smoke or have quit within the past 15 years. Fecal occult blood test (FOBT) of the stool. You may have this test every year starting at age 78. Flexible sigmoidoscopy or colonoscopy. You may have a sigmoidoscopy every 5 years or a colonoscopy every 10 years starting at age 78. Hepatitis C blood test. Hepatitis B blood test. Sexually transmitted disease (STD) testing. Diabetes screening. This is done by checking your blood sugar (glucose) after you have not eaten for a while (fasting). You may have this done every 1-3 years. Bone density scan. This is done to screen for osteoporosis. You may have this done starting at age 78. Mammogram. This may be done every 1-2 years. Talk to your health care provider about how often you should have regular mammograms. Talk with your health care provider about your test results, treatment options, and if necessary, the need for more tests. Vaccines  Your health care provider may recommend certain vaccines, such as: Influenza vaccine. This is recommended every year. Tetanus, diphtheria, and acellular pertussis (Tdap, Td) vaccine. You may need a Td booster every 10 years. Zoster vaccine. You may need this after age 78. Pneumococcal 13-valent conjugate (PCV13) vaccine. One dose is recommended after age 78. Pneumococcal polysaccharide (PPSV23) vaccine. One dose is recommended after age 26. Talk to your health care provider about which screenings and vaccines you need and how often you need them. This information is not intended to replace  advice given to you by your health care provider. Make sure you discuss any questions you  have with your health care provider. Document Released: 03/31/2015 Document Revised: 11/22/2015 Document Reviewed: 01/03/2015 Elsevier Interactive Patient Education  2017 [employer].  Fall Prevention in the Home Falls can cause injuries. They can happen to people of all ages. There are many things you can do to make your home safe and to help prevent falls. What can I do on the outside of my home? Regularly fix the edges of walkways and driveways and fix any cracks. Remove anything that might make you trip as you walk through a door, such as a raised step or threshold. Trim any bushes or trees on the path to your home. Use bright outdoor lighting. Clear any walking paths of anything that might make someone trip, such as rocks or tools. Regularly check to see if handrails are loose or broken. Make sure that both sides of any steps have handrails. Any raised decks and porches should have guardrails on the edges. Have any leaves, snow, or ice cleared regularly. Use sand or salt on walking paths during winter. Clean up any spills in your garage right away. This includes oil or grease spills. What can I do in the bathroom? Use night lights. Install grab bars by the toilet and in the tub and shower. Do not use towel bars as grab bars. Use non-skid mats or decals in the tub or shower. If you need to sit down in the shower, use a plastic, non-slip stool. Keep the floor dry. Clean up any water that spills on the floor as soon as it happens. Remove soap buildup in the tub or shower regularly. Attach bath mats securely with double-sided non-slip rug tape. Do not have throw rugs and other things on the floor that can make you trip. What can I do in the bedroom? Use night lights. Make sure that you have a light by your bed that is easy to reach. Do not use any sheets or blankets that are too big for your bed. They should not hang down onto the floor. Have a firm chair that has side arms. You can  use this for support while you get dressed. Do not have throw rugs and other things on the floor that can make you trip. What can I do in the kitchen? Clean up any spills right away. Avoid walking on wet floors. Keep items that you use a lot in easy-to-reach places. If you need to reach something above you, use a strong step stool that has a grab bar. Keep electrical cords out of the way. Do not use floor polish or wax that makes floors slippery. If you must use wax, use non-skid floor wax. Do not have throw rugs and other things on the floor that can make you trip. What can I do with my stairs? Do not leave any items on the stairs. Make sure that there are handrails on both sides of the stairs and use them. Fix handrails that are broken or loose. Make sure that handrails are as long as the stairways. Check any carpeting to make sure that it is firmly attached to the stairs. Fix any carpet that is loose or worn. Avoid having throw rugs at the top or bottom of the stairs. If you do have throw rugs, attach them to the floor with carpet tape. Make sure that you have a light switch at the top of the stairs and the bottom  of the stairs. If you do not have them, ask someone to add them for you. What else can I do to help prevent falls? Wear shoes that: Do not have high heels. Have rubber bottoms. Are comfortable and fit you well. Are closed at the toe. Do not wear sandals. If you use a stepladder: Make sure that it is fully opened. Do not climb a closed stepladder. Make sure that both sides of the stepladder are locked into place. Ask someone to hold it for you, if possible. Clearly mark and make sure that you can see: Any grab bars or handrails. First and last steps. Where the edge of each step is. Use tools that help you move around (mobility aids) if they are needed. These include: Canes. Walkers. Scooters. Crutches. Turn on the lights when you go into a dark area. Replace any light  bulbs as soon as they burn out. Set up your furniture so you have a clear path. Avoid moving your furniture around. If any of your floors are uneven, fix them. If there are any pets around you, be aware of where they are. Review your medicines with your doctor. Some medicines can make you feel dizzy. This can increase your chance of falling. Ask your doctor what other things that you can do to help prevent falls. This information is not intended to replace advice given to you by your health care provider. Make sure you discuss any questions you have with your health care provider. Document Released: 12/29/2008 Document Revised: 08/10/2015 Document Reviewed: 04/08/2014 Elsevier Interactive Patient Education  2017 [employer].

## 2023-05-23 ENCOUNTER — Other Ambulatory Visit: Payer: Self-pay | Admitting: Family Medicine

## 2023-05-26 ENCOUNTER — Ambulatory Visit (INDEPENDENT_AMBULATORY_CARE_PROVIDER_SITE_OTHER): Payer: Medicare PPO

## 2023-05-26 DIAGNOSIS — Z23 Encounter for immunization: Secondary | ICD-10-CM

## 2023-05-26 NOTE — Progress Notes (Signed)
 Patient is in office today for a nurse visit for Immunization. Patient Injection was given in the  Right deltoid. Patient tolerated injection well.

## 2023-06-09 ENCOUNTER — Other Ambulatory Visit: Payer: Self-pay | Admitting: Orthopedic Surgery

## 2023-07-17 ENCOUNTER — Other Ambulatory Visit: Payer: Self-pay | Admitting: Student

## 2023-07-21 ENCOUNTER — Encounter: Payer: Self-pay | Admitting: Podiatry

## 2023-07-21 ENCOUNTER — Ambulatory Visit: Payer: Medicare PPO | Admitting: Podiatry

## 2023-07-21 DIAGNOSIS — B351 Tinea unguium: Secondary | ICD-10-CM

## 2023-07-21 DIAGNOSIS — M79675 Pain in left toe(s): Secondary | ICD-10-CM

## 2023-07-21 DIAGNOSIS — M79674 Pain in right toe(s): Secondary | ICD-10-CM | POA: Diagnosis not present

## 2023-07-26 NOTE — Progress Notes (Signed)
  Subjective:  Patient ID: Amanda Mckay, female    DOB: 1946-01-21,  MRN: [id number]  Amanda Mckay presents to clinic today for painful, elongated thickened toenails x 10 which are symptomatic when wearing enclosed shoe gear. This interferes with his/her daily activities.  Chief Complaint  Patient presents with   Nail Problem    "Cut my toenails."   New problem(s): None.   PCP is Jenelle Mis, FNP.  Allergies  Allergen Reactions   Tetanus Toxoids Other (See Comments)    Reaction occurred more than 40 yrs ago--does not remember what occurred.    Review of Systems: Negative except as noted in the HPI.  Objective: No changes noted in today's physical examination. There were no vitals filed for this visit. Amanda Mckay is a pleasant 78 y.o. female WD, WN in NAD. AAO x 3.  Vascular Examination: CFT <3 seconds b/l. DP/PT pulses faintly palpable b/l. Skin temperature gradient warm to warm b/l. No pain with calf compression. No ischemia or gangrene. No cyanosis or clubbing noted b/l. Pedal hair absent.   Neurological Examination: Sensation grossly intact b/l with 10 gram monofilament. Vibratory sensation intact b/l.   Dermatological Examination: Pedal skin warm and supple b/l.   No open wounds. No interdigital macerations.  Toenails 2-5 b/l thick, discolored, elongated with subungual debris and pain on dorsal palpation.    Pincer nail deformity bilateral great toes. No erythema, no edema, no drainage, no fluctuance.  Hyperkeratotic lesion(s) R 5th toe.  No erythema, no edema, no drainage, no fluctuance.  Musculoskeletal Examination: Muscle strength 5/5 to all lower extremity muscle groups bilaterally. Hammertoe(s) 2-5 bilaterally. Utilizes rollator for ambulation assistance.  Radiographs: None  Assessment/Plan: 1. Pain due to onychomycosis of toenails of both feet     -Consent given for treatment as described below: -Examined patient. -Patient to  continue soft, supportive shoe gear daily. -Toenails 1-5 b/l were debrided in length and girth with sterile nail nippers and dremel without iatrogenic bleeding.  -As a courtesy, corn(s)  R 5th toe gently filed without complication or incident. Total number pared=1. -Patient/POA to call should there be question/concern in the interim.   Return in about 3 months (around 10/21/2023).  [name], DPM      [place] LOCATION: [address] [place] [address]                   Office [phone]   [facility] LOCATION: [address] [place], [place] [address] Office [phone]

## 2023-07-28 ENCOUNTER — Other Ambulatory Visit: Payer: Self-pay | Admitting: Internal Medicine

## 2023-07-28 NOTE — Telephone Encounter (Signed)
 Prescription refill request for Eliquis  received. Indication:afib Last office visit:6/24 Scr:0.95  1/25 Age: 78 Weight:115.7  kg  Prescription refilled

## 2023-08-31 NOTE — Progress Notes (Signed)
 Cardiology Office Note    Date:  09/02/2023  ID:  Thais, Silberstein 10-May-1945, MRN [id number] Cardiologist: Vina Gull, MD    History of Present Illness:    Amanda Mckay is a 78 y.o. female with past medical history of paroxysmal atrial fibrillation, HLD, GERD and hemochromatosis who presents to the office today for annual follow-up.  She was last examined by myself in 08/2022 and denied any recent chest pain or palpitations at that time. Had recently experienced worsening lower extremity edema but this was in the setting of a rash and was starting to improve. No changes were made to her cardiac medications and she was continued on Flecainide  75 mg twice daily, Lopressor  50 mg twice daily, Eliquis  5 mg twice daily, Lasix  20 mg PRN and Atorvastatin  10 mg daily.  In talking the patient today, she reports overall doing well since her last office visit. She uses a rolling walker for ambulation. Denies any recent chest pain or palpitations. Continues to have intermittent lower extremity edema but says she only takes Lasix  on the days that she knows she will be at home and will not have company or be going places. Has worsening edema on examination today but she did not take Lasix  today and also has been traveling and had [employer] for lunch. Reports episodes of what sounds consistent with PND but sometimes these occur in the setting of having a bad dream. She does report breathing improves upon sitting in the recliner. Remains on Eliquis  for anticoagulation with no reports of active bleeding. She questions if she will need to remain on Flecainide  and Lopressor  long-term.  Studies Reviewed:   EKG: EKG is ordered today and demonstrates:   EKG Interpretation Date/Time:  Tuesday September 02 2023 15:06:01 EDT Ventricular Rate:  79 PR Interval:  192 QRS Duration:  100 QT Interval:  428 QTC Calculation: 490 R Axis:   -21  Text Interpretation: Normal sinus rhythm Corrected QTc at 459  ms when manually calculated. Confirmed by Johnson Grate ([id number]) on 09/02/2023 3:16:14 PM       Echocardiogram: 05/2020 IMPRESSIONS     1. Left ventricular ejection fraction, by estimation, is 65 to 70%. The  left ventricle has normal function. The left ventricle has no regional  wall motion abnormalities. There is mild concentric left ventricular  hypertrophy. Left ventricular diastolic  parameters are consistent with Grade I diastolic dysfunction (impaired  relaxation).   2. Right ventricular systolic function is normal. The right ventricular  size is normal. There is normal pulmonary artery systolic pressure.   3. The mitral valve is normal in structure. Trivial mitral valve  regurgitation. No evidence of mitral stenosis.   4. The aortic valve is normal in structure. Aortic valve regurgitation is  not visualized. No aortic stenosis is present.   5. The inferior vena cava is normal in size with greater than 50%  respiratory variability, suggesting right atrial pressure of 3 mmHg.    Risk Assessment/Calculations:    CHA2DS2-VASc Score = 3   This indicates a 3.2% annual risk of stroke. The patient's score is based upon: CHF History: 0 HTN History: 0 Diabetes History: 0 Stroke History: 0 Vascular Disease History: 0 Age Score: 2 Gender Score: 1    Physical Exam:   VS:  BP 136/62 (BP Location: Left Arm)   Pulse 79   Ht 5' 6 (1.676 m)   Wt 262 lb 12.8 oz (119.2 kg)   SpO2 94%   BMI  42.42 kg/m    Wt Readings from Last 3 Encounters:  09/02/23 262 lb 12.8 oz (119.2 kg)  03/27/23 255 lb (115.7 kg)  03/14/23 250 lb (113.4 kg)     GEN: Well nourished, well developed female appearing in no acute distress NECK: No JVD; No carotid bruits CARDIAC: RRR, no murmurs, rubs, gallops RESPIRATORY:  Clear to auscultation without rales, wheezing or rhonchi  ABDOMEN: Appears non-distended. No obvious abdominal masses. EXTREMITIES: No clubbing or cyanosis. 1+ pitting edema  bilaterally.  Distal pedal pulses are 2+ bilaterally.   Assessment and Plan:   1. PAF (paroxysmal atrial fibrillation) (HCC)/ Long term (current) use of anticoagulants - She denies any recent palpitations and is in normal sinus rhythm by examination today. She questions if Lopressor  or Flecainide  can be discontinued but she had recurrent atrial fibrillation with discontinuation of Flecainide  in the past. We briefly discussed EP referral for consideration of ablation and she wishes to hold off on this for now given stability of symptoms. Continue current medical therapy with Flecainide  75 mg twice daily and Lopressor  50 mg twice daily. Her computer-generated QTc was read as prolonged but at 459 ms when manually calculated and this is overall similar to prior tracings. Will send a message to Dr. Okey to review as well.  - She is on Eliquis  5 mg twice daily for anticoagulation which is the appropriate dose given her age, weight and renal function. CBC in 02/2023 showed hemoglobin was at 14.9 and platelets at 162 K. Will recheck a CBC today.   2. Bilateral lower extremity edema - She has been on Lasix  20 mg PRN for lower extremity edema. Does have 1+ pitting edema on examination today but has not yet taken Lasix  and has been consuming more high sodium foods. Will recheck a  BNP and BMET today. Creatinine was stable at 0.95 by labs in 03/2023.  Encouraged her to limit sodium intake and also follow daily weights as she would benefit from taking an extra Lasix  if needed for worsening weight gain of 2 to 3 pounds overnight or 5 pounds in 1 week.  3. Mixed hyperlipidemia - Followed by PCP. FLP in 03/2023 showed total cholesterol at 170 and LDL at 98. Remains on Atorvastatin  10 mg daily.  Signed, Laymon CHRISTELLA Qua, PA-C

## 2023-09-02 ENCOUNTER — Ambulatory Visit: Payer: Self-pay | Admitting: Student

## 2023-09-02 ENCOUNTER — Ambulatory Visit: Payer: Medicare PPO | Attending: Student | Admitting: Student

## 2023-09-02 ENCOUNTER — Encounter: Payer: Self-pay | Admitting: Student

## 2023-09-02 ENCOUNTER — Other Ambulatory Visit (HOSPITAL_COMMUNITY)
Admission: RE | Admit: 2023-09-02 | Discharge: 2023-09-02 | Disposition: A | Discharge status: Home / Self Care | Source: Ambulatory Visit | Attending: Student | Admitting: Student

## 2023-09-02 VITALS — BP 136/62 | HR 79 | Ht 66.0 in | Wt 262.8 lb

## 2023-09-02 DIAGNOSIS — R600 Localized edema: Secondary | ICD-10-CM | POA: Diagnosis present

## 2023-09-02 DIAGNOSIS — E782 Mixed hyperlipidemia: Secondary | ICD-10-CM | POA: Diagnosis not present

## 2023-09-02 DIAGNOSIS — I480 Paroxysmal atrial fibrillation: Secondary | ICD-10-CM | POA: Diagnosis not present

## 2023-09-02 DIAGNOSIS — Z7901 Long term (current) use of anticoagulants: Secondary | ICD-10-CM

## 2023-09-02 DIAGNOSIS — Z79899 Other long term (current) drug therapy: Secondary | ICD-10-CM | POA: Insufficient documentation

## 2023-09-02 LAB — BASIC METABOLIC PANEL WITH GFR
Anion gap: 10 (ref 5–15)
BUN: 29 mg/dL — ABNORMAL HIGH (ref 8–23)
CO2: 26 mmol/L (ref 22–32)
Calcium: 9.1 mg/dL (ref 8.9–10.3)
Chloride: 106 mmol/L (ref 98–111)
Creatinine, Ser: 0.92 mg/dL (ref 0.44–1.00)
GFR, Estimated: >60 mL/min (ref >60)
Glucose, Bld: 99 mg/dL (ref 70–99)
Potassium: 3.7 mmol/L (ref 3.5–5.1)
Sodium: 142 mmol/L (ref 135–145)

## 2023-09-02 LAB — CBC
HCT: 43.8 % (ref 36.0–46.0)
Hemoglobin: 14.9 g/dL (ref 12.0–15.0)
MCH: 32.8 pg (ref 26.0–34.0)
MCHC: 34 g/dL (ref 30.0–36.0)
MCV: 96.5 fL (ref 80.0–100.0)
Platelets: 186 10*3/uL (ref 150–400)
RBC: 4.54 MIL/uL (ref 3.87–5.11)
RDW: 11.9 % (ref 11.5–15.5)
WBC: 7.7 10*3/uL (ref 4.0–10.5)
nRBC: 0 % (ref 0.0–0.2)

## 2023-09-02 LAB — BRAIN NATRIURETIC PEPTIDE: B Natriuretic Peptide: 175 pg/mL — ABNORMAL HIGH (ref 0.0–100.0)

## 2023-09-02 MED ORDER — APIXABAN 5 MG PO TABS: 5.0000 mg | ORAL_TABLET | Freq: Two times a day (BID) | ORAL | 3 refills | Status: AC

## 2023-09-02 MED ORDER — METOPROLOL TARTRATE 50 MG PO TABS: 50.0000 mg | ORAL_TABLET | Freq: Two times a day (BID) | ORAL | 3 refills | Status: AC

## 2023-09-02 NOTE — Patient Instructions (Signed)
 Medication Instructions:  Your physician recommends that you continue on your current medications as directed. Please refer to the Current Medication list given to you today.  *If you need a refill on your cardiac medications before your next appointment, please call your pharmacy*  Lab Work: Your physician recommends that you return for lab work in: Today ( BNP, BMP, CBC)   If you have labs (blood work) drawn today and your tests are completely normal, you will receive your results only by: MyChart Message (if you have MyChart) OR A paper copy in the mail If you have any lab test that is abnormal or we need to change your treatment, we will call you to review the results.  Testing/Procedures: NONE   Follow-Up: At [facility], you and your health needs are our priority.  As part of our continuing mission to provide you with exceptional heart care, our providers are all part of one team.  This team includes your primary Cardiologist (physician) and Advanced Practice Providers or APPs (Physician Assistants and Nurse Practitioners) who all work together to provide you with the care you need, when you need it.  Your next appointment:   1 year(s)  Provider:   You may see Ola Berger, MD or one of the following Advanced Practice Providers on your designated Care Team:   Woodfin Hays, PA-C  [place], [place] Theotis Flake, [place]     We recommend signing up for the patient portal called MyChart.  Sign up information is provided on this After Visit Summary.  MyChart is used to connect with patients for Virtual Visits (Telemedicine).  Patients are able to view lab/test results, encounter notes, upcoming appointments, etc.  Non-urgent messages can be sent to your provider as well.   To learn more about what you can do with MyChart, go to [email or web address].   Other Instructions Thank you for choosing [facility]!

## 2023-09-10 ENCOUNTER — Other Ambulatory Visit: Payer: Self-pay | Admitting: Family Medicine

## 2023-10-08 ENCOUNTER — Telehealth: Payer: Self-pay

## 2023-10-08 NOTE — Telephone Encounter (Signed)
 Copied from CRM [id number]. Topic: Clinical - Medication Question >> Oct 08, 2023 10:25 AM Essie A wrote: Reason for CRM: Patient is calling to find out if she can get a 2nd shingles injection.  Please return her call at [phone]. Thanks.

## 2023-10-09 NOTE — Telephone Encounter (Signed)
 Pt informed that she has had both shingles vaccines.

## 2023-10-23 ENCOUNTER — Ambulatory Visit: Admitting: Podiatry

## 2023-10-23 ENCOUNTER — Encounter: Payer: Self-pay | Admitting: Podiatry

## 2023-10-23 DIAGNOSIS — M79675 Pain in left toe(s): Secondary | ICD-10-CM

## 2023-10-23 DIAGNOSIS — M79674 Pain in right toe(s): Secondary | ICD-10-CM | POA: Diagnosis not present

## 2023-10-23 DIAGNOSIS — B351 Tinea unguium: Secondary | ICD-10-CM

## 2023-10-27 NOTE — Progress Notes (Signed)
  Subjective:  Patient ID: Amanda Mckay, female    DOB: 01/24/1946,  MRN: [id number]  78 y.o. female presents painful thick toenails that are difficult to trim. Pain interferes with ambulation. Aggravating factors include wearing enclosed shoe gear. Pain is relieved with periodic professional debridement.  Chief Complaint  Patient presents with   RFC    Rm 2 Routine foot care. PCP FNP Kayla lst visit Jan. 2025   New problem(s): None   PCP is Kayla Jeoffrey RAMAN, FNP. Allergies  Allergen Reactions   Tetanus Toxoids Other (See Comments)    Reaction occurred more than 40 yrs ago--does not remember what occurred.    Review of Systems: Negative except as noted in the HPI.   Objective:  Amanda Mckay is a pleasant 78 y.o. female obese in NAD. AAO x 3.  Vascular Examination: Vascular status intact b/l with palpable pedal pulses. CFT immediate b/l. Pedal hair present. No edema. No pain with calf compression b/l. Skin temperature gradient WNL b/l. No varicosities noted. No cyanosis or clubbing noted.  Neurological Examination: Sensation grossly intact b/l with 10 gram monofilament. Vibratory sensation intact b/l.  Dermatological Examination: Pedal skin with normal turgor, texture and tone b/l. No open wounds nor interdigital macerations noted. Toenails 1-5 b/l thick, discolored, elongated with subungual debris and pain on dorsal palpation. No hyperkeratotic lesions noted b/l.   Musculoskeletal Examination: Muscle strength 5/5 to b/l LE.  No pain, crepitus noted b/l. No gross pedal deformities. Patient ambulates independently without assistive aids.   Radiographs: None Assessment:   1. Pain due to onychomycosis of toenails of both feet    Plan:  Patient was evaluated and treated. All patient's and/or POA's questions/concerns addressed on today's visit. Mycotic toenails 1-5 debrided in length and girth without incident. Continue soft, supportive shoe gear daily. Report any  pedal injuries to medical professional. Call office if there are any quesitons/concerns. -Patient/POA to call should there be question/concern in the interim.  Return in about 10 weeks (around 01/01/2024).  Amanda Mckay, DPM      [place] LOCATION: [address], [place] [address]                   Office [phone]   [facility] LOCATION: [address] [place], [place] [address] Office [phone]

## 2023-11-24 ENCOUNTER — Ambulatory Visit (INDEPENDENT_AMBULATORY_CARE_PROVIDER_SITE_OTHER)

## 2023-11-24 DIAGNOSIS — Z23 Encounter for immunization: Secondary | ICD-10-CM | POA: Diagnosis not present

## 2023-11-24 NOTE — Progress Notes (Signed)
 Patient is in office today for a nurse visit for High Dose Flu Vaccine Immunization. Patient Injection was given in the  Right deltoid. Patient tolerated injection well.  Elida GORMAN Manila, CMA

## 2023-12-09 ENCOUNTER — Other Ambulatory Visit: Payer: Self-pay | Admitting: Orthopedic Surgery

## 2023-12-31 ENCOUNTER — Encounter (INDEPENDENT_AMBULATORY_CARE_PROVIDER_SITE_OTHER): Payer: Self-pay | Admitting: Gastroenterology

## 2024-01-12 ENCOUNTER — Encounter: Payer: Self-pay | Admitting: Podiatry

## 2024-01-12 ENCOUNTER — Ambulatory Visit: Admitting: Podiatry

## 2024-01-12 DIAGNOSIS — B351 Tinea unguium: Secondary | ICD-10-CM

## 2024-01-12 DIAGNOSIS — M79675 Pain in left toe(s): Secondary | ICD-10-CM

## 2024-01-12 DIAGNOSIS — M79674 Pain in right toe(s): Secondary | ICD-10-CM | POA: Diagnosis not present

## 2024-01-17 NOTE — Progress Notes (Signed)
  Subjective:  Patient ID: Amanda Mckay, female    DOB: 1945/12/27,  MRN: [id number]  Amanda Mckay presents to clinic today for preventative diabetic foot care for painful elongated mycotic toenails 1-5 bilaterally which are tender when wearing enclosed shoe gear. Pain is relieved with periodic professional debridement.  Chief Complaint  Patient presents with   Toe Pain    PA Kayla is her PCP. Last visit was in Jan . Denies being diabetic   New problem(s): None.   PCP is Kayla Jeoffrey RAMAN, FNP.  Allergies  Allergen Reactions   Tetanus Toxoid-Containing Vaccines Other (See Comments)    Reaction occurred more than 40 yrs ago--does not remember what occurred.    Review of Systems: Negative except as noted in the HPI.  Objective: No changes noted in today's physical examination. There were no vitals filed for this visit. Amanda Mckay is a pleasant 78 y.o. female obese in NAD. AAO x 3.  Neurovascular status intact b/l and symmetrically.  Dermatological Examination: Pedal skin with normal turgor, texture and tone b/l. No open wounds nor interdigital macerations noted. Toenails 1-5 b/l thick, discolored, elongated with subungual debris and pain on dorsal palpation. No hyperkeratotic lesions noted b/l.   Musculoskeletal Examination: Muscle strength 5/5 to b/l LE.  No pain, crepitus noted b/l. No gross pedal deformities. Patient ambulates independently without assistive aids.   Radiographs: None  Assessment/Plan: 1. Pain due to onychomycosis of toenails of both feet   Consent given for treatment. Patient examined. All patient's and/or POA's questions/concerns addressed on today's visit. Toenails 1-5 b/l debrided in length and girth without incident. Continue soft, supportive shoe gear daily. Report any pedal injuries to medical professional. Call office if there are any questions/concerns. -Patient/POA to call should there be question/concern in the interim.    Return in about 10 weeks (around 03/22/2024).  Delon LITTIE Merlin, DPM      [place] LOCATION: [address], [place] [address]                   Office [phone]   [facility] LOCATION: [address] [place], [place] [address] Office [phone]

## 2024-03-25 ENCOUNTER — Other Ambulatory Visit: Payer: Self-pay | Admitting: Family Medicine

## 2024-03-29 ENCOUNTER — Ambulatory Visit: Admitting: Family Medicine

## 2024-03-29 ENCOUNTER — Encounter: Payer: Self-pay | Admitting: Family Medicine

## 2024-03-29 VITALS — BP 138/70 | HR 64 | Temp 97.8°F | Ht 66.0 in | Wt 258.8 lb

## 2024-03-29 DIAGNOSIS — Z6841 Body Mass Index (BMI) 40.0 and over, adult: Secondary | ICD-10-CM | POA: Insufficient documentation

## 2024-03-29 DIAGNOSIS — K219 Gastro-esophageal reflux disease without esophagitis: Secondary | ICD-10-CM

## 2024-03-29 DIAGNOSIS — I10 Essential (primary) hypertension: Secondary | ICD-10-CM

## 2024-03-29 DIAGNOSIS — Z0001 Encounter for general adult medical examination with abnormal findings: Secondary | ICD-10-CM | POA: Diagnosis not present

## 2024-03-29 DIAGNOSIS — M4316 Spondylolisthesis, lumbar region: Secondary | ICD-10-CM

## 2024-03-29 DIAGNOSIS — E782 Mixed hyperlipidemia: Secondary | ICD-10-CM

## 2024-03-29 DIAGNOSIS — M8589 Other specified disorders of bone density and structure, multiple sites: Secondary | ICD-10-CM

## 2024-03-29 DIAGNOSIS — M1711 Unilateral primary osteoarthritis, right knee: Secondary | ICD-10-CM

## 2024-03-29 DIAGNOSIS — I5032 Chronic diastolic (congestive) heart failure: Secondary | ICD-10-CM | POA: Diagnosis not present

## 2024-03-29 DIAGNOSIS — K439 Ventral hernia without obstruction or gangrene: Secondary | ICD-10-CM

## 2024-03-29 DIAGNOSIS — I509 Heart failure, unspecified: Secondary | ICD-10-CM | POA: Insufficient documentation

## 2024-03-29 DIAGNOSIS — I480 Paroxysmal atrial fibrillation: Secondary | ICD-10-CM | POA: Diagnosis not present

## 2024-03-29 DIAGNOSIS — R600 Localized edema: Secondary | ICD-10-CM

## 2024-03-29 DIAGNOSIS — Z0000 Encounter for general adult medical examination without abnormal findings: Secondary | ICD-10-CM

## 2024-03-29 DIAGNOSIS — E559 Vitamin D deficiency, unspecified: Secondary | ICD-10-CM

## 2024-03-29 NOTE — Progress Notes (Signed)
 "  Complete physical exam  Patient: Amanda Dismuke SigmonFemale    DOB: 1945-08-14 79 y.o.   MRN: [id number]  Chief Complaint  Patient presents with   Annual Exam    Subjective:    Amanda Mckay is a 79 y.o. female who presents today for a complete physical exam. She reports consuming a low sodium diet. The patient does not participate in regular exercise at present. She generally feels well. She reports sleeping well. She does not have additional problems to discuss today.   She has a past medical history significant for atrial fibrillation, congestive heart failure, osteopenia, obesity, HTN, HLD, GERD, and hemochromatosis. She is followed closely by Cardiology, Dermatology, Ophthalmology, and Podiatry.   Atrial fibrillation: followed by Cardiology, on Flecainide  75mg  BID, Lopressor  50mg  BID, Eliquis  5mg  BID  CHF: followed by Cardiology, on Lasix  20mg  PRN almost daily  HTN: on Metoprolol   HLD: on Atorvastatin  10mg  daily  GERD: diet controlled  Hemochromatosis: iron levels today  Osteoarthitis and Spondylosis: followed by orthopedics.  Osteopenia: taking calcium  and vitamin D   Discussed the use of AI scribe software for clinical note transcription with the patient, who gave verbal consent to proceed.  History of Present Illness Amanda Mckay is a 79 year old female who presents for an annual follow-up visit.  She has a history of atrial fibrillation and is currently taking flecainide , Lopressor , and Eliquis . She also takes Lasix , atorvastatin , and a vitamin D  supplement. No chest pain, palpitations, or bleeding issues while on blood thinners, although she notes minor bleeding if she injures herself.  She has scoliosis and spondylosis, diagnosed by Dr. Margrette Shove. An MRI was performed, but no interventions were recommended as they might worsen her condition. She has received knee injections in the past but did not find them significantly beneficial and has not had  them recently.  She follows a low sodium diet and tries to eat healthily, including vegetables like kale. She does not exercise much due to her back and leg issues. She takes Lasix  daily unless she is going out, to avoid frequent urination. She experiences swelling in her feet, which improves with consistent Lasix  use.  She reports her acid reflux is much improved and she no longer takes medication for it regularly. No pain with swallowing, difficulty swallowing, vomiting, or blood in her stool. Her bowel movements are normal.  She has a history of cellulitis in her leg, which led to a diagnosis of heart failure in the past, although her cardiology notes focus more on her atrial fibrillation.  She has been fitted for compression stockings but finds them difficult to wear. She takes calcium  with vitamin D , although she is unsure of the exact dosage and plans to verify her intake. She has not had a recent DEXA scan.  She has not received a COVID vaccine this year but has had the flu and shingles vaccines. She reports a past allergic reaction to a tetanus vaccine, which involved a skin reaction.   Most recent fall risk assessment:    05/01/2023   11:44 AM  Fall Risk   Falls in the past year? 0  Number falls in past yr: 0  Injury with Fall? 0   Follow up Falls evaluation completed;Education provided;Falls prevention discussed     Data saved with a previous flowsheet row definition     Most recent depression screenings:    05/01/2023   11:55 AM 03/27/2023   10:05 AM  PHQ 2/9 Scores  PHQ -  2 Score 0 0  PHQ- 9 Score 0       Data saved with a previous flowsheet row definition    Vision:Within last year and Dental: No current dental problems Patient Active Problem List   Diagnosis Date Noted   Morbid obesity (HCC) 03/29/2024   Body mass index (BMI) 40.0-44.9, adult (HCC) 03/29/2024   PAF (paroxysmal atrial fibrillation) (HCC) 03/29/2024   Chronic heart failure (HCC) 03/29/2024    Physical exam, annual 03/25/2022   Gastroesophageal reflux disease 09/23/2020   RLS (restless legs syndrome) 01/04/2020   Osteopenia 12/31/2019   Compression fracture of L1 lumbar vertebra (HCC) 12/06/2019   Spondylolisthesis at L4-L5 level 12/06/2019   Pincer nail deformity 06/10/2019   S/P right knee arthroscopy 01/28/2019 03/08/2019   Derangement of posterior horn of medial meniscus of right knee    Primary osteoarthritis of right knee    Pain due to onychomycosis of toenails of both feet 12/07/2018   Peripheral edema 12/01/2018   Essential hypertension    Chronic diastolic HF (heart failure) (HCC)    Ventral hernia without obstruction or gangrene 08/01/2017   Hyperlipidemia 02/27/2017   Vitamin D  deficiency 02/27/2017   Hereditary hemochromatosis 02/27/2017   Past Medical History:  Diagnosis Date   Arthritis    Atrial fibrillation (HCC)    Cataract    Congestive heart failure (HCC)    Dysrhythmia    AFib   GERD (gastroesophageal reflux disease)    Hemochromatosis    Iron excess    Spondylosis of lumbar spine    Past Surgical History:  Procedure Laterality Date   BIOPSY  06/13/2022   Procedure: BIOPSY;  Surgeon: Eartha Angelia Sieving, MD;  Location: [facility];  Service: Gastroenterology;;   CESAREAN SECTION     COLONOSCOPY WITH PROPOFOL  N/A 06/13/2022   Procedure: COLONOSCOPY WITH PROPOFOL ;  Surgeon: Eartha Angelia Sieving, MD;  Location: [facility];  Service: Gastroenterology;  Laterality: N/A;  10:45AM;ASA 3   KNEE ARTHROSCOPY WITH MEDIAL MENISECTOMY Right 01/28/2019   Procedure: RIGHT KNEE ARTHROSCOPY WITH MEDIAL MENISCECTOMY;  Surgeon: Margrette Taft BRAVO, MD;  Location: [facility];  Service: Orthopedics;  Laterality: Right;   POLYPECTOMY  06/13/2022   Procedure: POLYPECTOMY;  Surgeon: Eartha Angelia Sieving, MD;  Location: [facility];  Service: Gastroenterology;;   TONSILLECTOMY     VENTRAL HERNIA REPAIR N/A 01/26/2018   Procedure: HERNIA REPAIR  VENTRAL ADULT WITH MESH;  Surgeon: Kallie Manuelita BROCKS, MD;  Location: [facility];  Service: General;  Laterality: N/A;   Social History[1] Family History  Problem Relation Age of Onset   Stroke Mother    Atrial fibrillation Son    Stroke Maternal Grandmother    Diabetes Paternal Grandfather    Arthritis Brother    Allergies[2]    Patient Care Team: Kayla Amanda RAMAN, FNP as PCP - General (Family Medicine) Okey Vina GAILS, MD as PCP - Cardiology (Cardiology) Loreda Hacker, DPM as Consulting Physician (Podiatry) Register, Jeoffrey, PA-C as Physician Assistant (Dermatology) Elma Zachary RAMAN [facility])   Review of Systems  Constitutional: Negative.   HENT: Negative.    Eyes: Negative.   Respiratory: Negative.    Cardiovascular:  Positive for leg swelling.  Gastrointestinal: Negative.   Genitourinary: Negative.   Musculoskeletal: Negative.   Skin: Negative.   Neurological: Negative.   Endo/Heme/Allergies: Negative.   Psychiatric/Behavioral: Negative.    All other systems reviewed and are negative.     Objective:    BP 138/70 (BP Location: Left Arm, Patient Position:  Sitting, Cuff Size: Large)   Pulse 64   Temp 97.8 F (36.6 C)   Ht 5' 6 (1.676 m)   Wt 258 lb 12.8 oz (117.4 kg)   SpO2 98%   BMI 41.77 kg/m  BP Readings from Last 3 Encounters:  03/29/24 138/70  09/02/23 136/62  03/27/23 132/82   Wt Readings from Last 3 Encounters:  03/29/24 258 lb 12.8 oz (117.4 kg)  09/02/23 262 lb 12.8 oz (119.2 kg)  03/27/23 255 lb (115.7 kg)      Physical Exam Vitals and nursing note reviewed.  Constitutional:      Appearance: Normal appearance. She is obese.     Comments: Ambulating with walker  HENT:     Head: Normocephalic and atraumatic.     Right Ear: Tympanic membrane, ear canal and external ear normal.     Left Ear: Tympanic membrane, ear canal and external ear normal.     Nose: Nose normal.     Mouth/Throat:     Mouth: Mucous membranes are moist.     Pharynx:  Oropharynx is clear.  Eyes:     Extraocular Movements: Extraocular movements intact.     Conjunctiva/sclera: Conjunctivae normal.     Pupils: Pupils are equal, round, and reactive to light.  Cardiovascular:     Rate and Rhythm: Normal rate and regular rhythm.     Pulses: Normal pulses.     Heart sounds: Normal heart sounds.  Pulmonary:     Effort: Pulmonary effort is normal.     Breath sounds: Normal breath sounds.  Abdominal:     General: Bowel sounds are normal.     Palpations: Abdomen is soft.  Musculoskeletal:        General: Normal range of motion.     Cervical back: Normal range of motion and neck supple.     Right lower leg: Edema present.     Left lower leg: Edema present.  Skin:    General: Skin is warm and dry.     Capillary Refill: Capillary refill takes less than 2 seconds.  Neurological:     General: No focal deficit present.     Mental Status: She is alert and oriented to person, place, and time. Mental status is at baseline.  Psychiatric:        Mood and Affect: Mood normal.        Behavior: Behavior normal.        Thought Content: Thought content normal.        Judgment: Judgment normal.       No results found for any visits on 03/29/24. Last CBC Lab Results  Component Value Date   WBC 7.7 09/02/2023   HGB 14.9 09/02/2023   HCT 43.8 09/02/2023   MCV 96.5 09/02/2023   MCH 32.8 09/02/2023   RDW 11.9 09/02/2023   PLT 186 09/02/2023   Last metabolic panel Lab Results  Component Value Date   GLUCOSE 99 09/02/2023   NA 142 09/02/2023   K 3.7 09/02/2023   CL 106 09/02/2023   CO2 26 09/02/2023   BUN 29 (H) 09/02/2023   CREATININE 0.92 09/02/2023   GFRNONAA >60 09/02/2023   CALCIUM  9.1 09/02/2023   PROT 6.6 03/20/2023   ALBUMIN 4.0 03/14/2023   BILITOT 0.4 03/20/2023   ALKPHOS 70 03/14/2023   AST 14 03/20/2023   ALT 12 03/20/2023   ANIONGAP 10 09/02/2023   Last lipids Lab Results  Component Value Date   CHOL 170 03/20/2023  HDL 46 (L)  03/20/2023   LDLCALC 98 03/20/2023   TRIG 159 (H) 03/20/2023   CHOLHDL 3.7 03/20/2023   Last hemoglobin A1c Lab Results  Component Value Date   HGBA1C 5.4 06/08/2020   Last thyroid  functions Lab Results  Component Value Date   TSH 3.161 06/30/2020   Last vitamin D  Lab Results  Component Value Date   VD25OH 51 10/13/2020   Last vitamin B12 and Folate No results found for: VITAMINB12, FOLATE       Assessment & Plan:    Routine Health Maintenance and Physical Exam Immunization History  Administered Date(s) Administered   Fluad Quad(high Dose 65+) 12/30/2018, 12/06/2019, 01/04/2021   Fluad Trivalent(High Dose 65+) 12/10/2022   INFLUENZA, HIGH DOSE SEASONAL PF 01/05/2018, 11/24/2023   Influenza,inj,Quad PF,6+ Mos 02/27/2017   Moderna Sars-Covid-2 Vaccination 05/12/2019, 06/09/2019, 01/22/2020   Pneumococcal Conjugate-13 01/18/2014   Pneumococcal Polysaccharide-23 04/11/2011   Zoster Recombinant(Shingrix ) 03/27/2023, 05/26/2023    Health Maintenance  Topic Date Due   Medicare Annual Wellness (AWV)  04/30/2024   COVID-19 Vaccine (4 - 2025-26 season) 04/14/2024 (Originally 11/17/2023)   Pneumococcal Vaccine: 50+ Years  Completed   Influenza Vaccine  Completed   Zoster Vaccines- Shingrix   Completed   Meningococcal B Vaccine  Aged Out   DTaP/Tdap/Td  Discontinued   Mammogram  Discontinued   Bone Density Scan  Discontinued   Hepatitis C Screening  Discontinued   Fecal DNA (Cologuard)  Discontinued    Discussed health benefits of physical activity, and encouraged her to engage in regular exercise appropriate for her age and condition.  Problem List Items Addressed This Visit       Cardiovascular and Mediastinum   Essential hypertension   Relevant Orders   CBC with Differential/Platelet   Comprehensive metabolic panel with GFR   Lipid panel   VITAMIN D  25 Hydroxy (Vit-D Deficiency, Fractures)   Hemoglobin A1c   Chronic diastolic HF (heart failure) (HCC)   PAF  (paroxysmal atrial fibrillation) (HCC)     Digestive   Gastroesophageal reflux disease     Musculoskeletal and Integument   Primary osteoarthritis of right knee   Osteopenia   Relevant Orders   CBC with Differential/Platelet   Comprehensive metabolic panel with GFR   Lipid panel   VITAMIN D  25 Hydroxy (Vit-D Deficiency, Fractures)   Hemoglobin A1c   Spondylolisthesis at L4-L5 level     Other   Hyperlipidemia   Relevant Orders   CBC with Differential/Platelet   Comprehensive metabolic panel with GFR   Lipid panel   VITAMIN D  25 Hydroxy (Vit-D Deficiency, Fractures)   Hemoglobin A1c   Vitamin D  deficiency   Relevant Orders   CBC with Differential/Platelet   Comprehensive metabolic panel with GFR   Lipid panel   VITAMIN D  25 Hydroxy (Vit-D Deficiency, Fractures)   Hemoglobin A1c   Hereditary hemochromatosis   Relevant Orders   Iron, TIBC and Ferritin Panel   B12 and Folate Panel   Ventral hernia without obstruction or gangrene   Peripheral edema   Physical exam, annual - Primary   Relevant Orders   CBC with Differential/Platelet   Comprehensive metabolic panel with GFR   Lipid panel   VITAMIN D  25 Hydroxy (Vit-D Deficiency, Fractures)   Hemoglobin A1c   Morbid obesity (HCC)   Relevant Orders   CBC with Differential/Platelet   Comprehensive metabolic panel with GFR   Lipid panel   VITAMIN D  25 Hydroxy (Vit-D Deficiency, Fractures)   Hemoglobin A1c   Body  mass index (BMI) 40.0-44.9, adult (HCC)    Assessment and Plan Assessment & Plan Adult Wellness Visit Annual wellness visit completed. No significant health changes. Blood pressure slightly elevated, likely anxiety-related. Limited exercise due to back and leg issues. Vaccinations up to date except for COVID and tetanus, she has a history of allergic reaction to tetanus. - Ordered blood work. - Encouraged home blood pressure monitoring. - Encouraged regular dental visits every six months. - Encouraged regular  eye exams. - Declines DEXA  Paroxysmal atrial fibrillation Well controlled with current medications. No recent cardiac symptoms or bleeding issues. - Continue current medications: flecainide , metoprolol , Eliquis .  Chronic diastolic heart failure No recent cardiology concerns or significant changes in peripheral edema. - Continue current medications: Lasix  as needed.  Essential hypertension Slightly elevated blood pressure, likely anxiety-related. No changes in symptoms or medication regimen. - Encouraged home blood pressure monitoring. - Continue current antihypertensive regimen.  Gastroesophageal reflux disease - Avoiding triggers  Morbid obesity - Counseled on importance of weight management for overall health. Encouraged low calorie, heart healthy diet and moderate intensity exercise 150 minutes weekly. Exercise limited by mobility and pain.  Osteopenia - Continue over the counter Calcium  1,200 mg and Vitamin  D3 800 IU once daily to protect your bones. Doing weightbearing exercise like walking, jogging, dancing, etc. is good for you bones. Please have the DEXA rechecked in 2 years.  Vitamin D  deficiency - Vitamin D  today  Hyperlipidemia - Labs today. - I recommend consuming a heart healthy diet such as Mediterranean diet or DASH diet with whole grains, fruits, vegetable, fish, lean meats, nuts, and olive oil. Limit sweets and processed foods. I also encourage moderate intensity exercise 150 minutes weekly.   Hemochromatosis - Labs today     Return in about 6 months (around 09/26/2024) for chronic follow-up with labs 1 week prior.    Amanda GORMAN Barrio, FNP [facility]       [1]  Social History Tobacco Use   Smoking status: Never    Passive exposure: Past   Smokeless tobacco: Never  Vaping Use   Vaping status: Never Used  Substance Use Topics   Alcohol use: Not Currently    Alcohol/week: 2.0 standard drinks of alcohol    Types: 2  Glasses of wine per week    Comment: sometimes   Drug use: No  [2]  Allergies Allergen Reactions   Tetanus Toxoid-Containing Vaccines Other (See Comments)    Reaction occurred more than 40 yrs ago--does not remember what occurred.   "

## 2024-03-30 LAB — COMPREHENSIVE METABOLIC PANEL WITH GFR
AG Ratio: 1.7 (calc) (ref 1.0–2.5)
ALT: 13 U/L (ref 6–29)
AST: 15 U/L (ref 10–35)
Albumin: 4.1 g/dL (ref 3.6–5.1)
Alkaline phosphatase (APISO): 73 U/L (ref 37–153)
BUN: 24 mg/dL (ref 7–25)
CO2: 27 mmol/L (ref 20–32)
Calcium: 9.2 mg/dL (ref 8.6–10.4)
Chloride: 106 mmol/L (ref 98–110)
Creat: 0.82 mg/dL (ref 0.60–1.00)
Globulin: 2.4 g/dL (ref 1.9–3.7)
Glucose, Bld: 86 mg/dL (ref 65–99)
Potassium: 4.1 mmol/L (ref 3.5–5.3)
Sodium: 142 mmol/L (ref 135–146)
Total Bilirubin: 0.6 mg/dL (ref 0.2–1.2)
Total Protein: 6.5 g/dL (ref 6.1–8.1)
eGFR: 73 mL/min/1.73m2

## 2024-03-30 LAB — LIPID PANEL
Cholesterol: 159 mg/dL
HDL: 51 mg/dL
LDL Cholesterol (Calc): 84 mg/dL
Non-HDL Cholesterol (Calc): 108 mg/dL
Total CHOL/HDL Ratio: 3.1 (calc)
Triglycerides: 141 mg/dL

## 2024-03-30 LAB — HEMOGLOBIN A1C
Hgb A1c MFr Bld: 5.2 %
Mean Plasma Glucose: 103 mg/dL
eAG (mmol/L): 5.7 mmol/L

## 2024-03-30 LAB — TEST AUTHORIZATION

## 2024-03-30 LAB — CBC WITH DIFFERENTIAL/PLATELET
Absolute Lymphocytes: 1394 {cells}/uL (ref 850–3900)
Absolute Monocytes: 369 {cells}/uL (ref 200–950)
Basophils Absolute: 31 {cells}/uL (ref 0–200)
Basophils Relative: 0.6 %
Eosinophils Absolute: 177 {cells}/uL (ref 15–500)
Eosinophils Relative: 3.4 %
HCT: 43.1 % (ref 35.9–46.0)
Hemoglobin: 14.2 g/dL (ref 11.7–15.5)
MCH: 31.4 pg (ref 27.0–33.0)
MCHC: 32.9 g/dL (ref 31.6–35.4)
MCV: 95.4 fL (ref 81.4–101.7)
MPV: 10.3 fL (ref 7.5–12.5)
Monocytes Relative: 7.1 %
Neutro Abs: 3229 {cells}/uL (ref 1500–7800)
Neutrophils Relative %: 62.1 %
Platelets: 176 Thousand/uL (ref 140–400)
RBC: 4.52 Million/uL (ref 3.80–5.10)
RDW: 12.1 % (ref 11.0–15.0)
Total Lymphocyte: 26.8 %
WBC: 5.2 Thousand/uL (ref 3.8–10.8)

## 2024-03-30 LAB — IRON,TIBC AND FERRITIN PANEL
%SAT: 65 % — ABNORMAL HIGH (ref 16–45)
Ferritin: 255 ng/mL (ref 16–288)
Iron: 175 ug/dL — ABNORMAL HIGH (ref 45–160)
TIBC: 271 ug/dL (ref 250–450)

## 2024-03-30 LAB — B12 AND FOLATE PANEL
Folate: 10.6 ng/mL
Vitamin B-12: 357 pg/mL (ref 200–1100)

## 2024-03-30 LAB — VITAMIN D 25 HYDROXY (VIT D DEFICIENCY, FRACTURES): Vit D, 25-Hydroxy: 58 ng/mL (ref 30–100)

## 2024-03-31 ENCOUNTER — Ambulatory Visit: Payer: Self-pay | Admitting: Family Medicine

## 2024-04-26 ENCOUNTER — Ambulatory Visit: Admitting: Podiatry

## 2024-05-05 ENCOUNTER — Ambulatory Visit

## 2024-09-27 ENCOUNTER — Ambulatory Visit: Admitting: Family Medicine

## 2025-03-31 ENCOUNTER — Encounter: Admitting: Family Medicine
